# Patient Record
Sex: Female | Born: 1990 | Race: Black or African American | Hispanic: No | Marital: Single | Smoking: Current every day smoker
Health system: Southern US, Community
[De-identification: ages and names within clinical notes are randomized; demographics above are authoritative.]

## PROBLEM LIST (undated history)

## (undated) ENCOUNTER — Inpatient Hospital Stay (HOSPITAL_COMMUNITY): Payer: Self-pay

## (undated) DIAGNOSIS — M549 Dorsalgia, unspecified: Secondary | ICD-10-CM

## (undated) DIAGNOSIS — L309 Dermatitis, unspecified: Secondary | ICD-10-CM

## (undated) DIAGNOSIS — K8020 Calculus of gallbladder without cholecystitis without obstruction: Secondary | ICD-10-CM

## (undated) HISTORY — PX: OTHER SURGICAL HISTORY: SHX169

## (undated) HISTORY — DX: Dorsalgia, unspecified: M54.9

---
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
## 2012-01-03 ENCOUNTER — Inpatient Hospital Stay (HOSPITAL_COMMUNITY): Payer: Medicaid Other

## 2012-01-03 ENCOUNTER — Encounter (HOSPITAL_COMMUNITY): Payer: Self-pay | Admitting: *Deleted

## 2012-01-03 ENCOUNTER — Inpatient Hospital Stay (HOSPITAL_COMMUNITY)
Admission: AD | Admit: 2012-01-03 | Discharge: 2012-01-03 | Disposition: A | Discharge status: Hospice | Payer: Medicaid Other | Source: Ambulatory Visit | Attending: Obstetrics | Admitting: Obstetrics

## 2012-01-03 DIAGNOSIS — O210 Mild hyperemesis gravidarum: Secondary | ICD-10-CM | POA: Insufficient documentation

## 2012-01-03 DIAGNOSIS — R112 Nausea with vomiting, unspecified: Secondary | ICD-10-CM

## 2012-01-03 LAB — CBC
HCT: 36.8 % (ref 36.0–46.0)
MCH: 30.9 pg (ref 26.0–34.0)
MCHC: 35.1 g/dL (ref 30.0–36.0)
MCV: 88.2 fL (ref 78.0–100.0)
Platelets: 277 10*3/uL (ref 150–400)
RDW: 13 % (ref 11.5–15.5)

## 2012-01-03 LAB — COMPREHENSIVE METABOLIC PANEL
ALT: 29 U/L (ref 0–35)
Alkaline Phosphatase: 70 U/L (ref 39–117)
BUN: 4 mg/dL — ABNORMAL LOW (ref 6–23)
CO2: 18 mEq/L — ABNORMAL LOW (ref 19–32)
GFR calc Af Amer: >90 mL/min (ref >90)
GFR calc non Af Amer: >90 mL/min (ref >90)
Glucose, Bld: 74 mg/dL (ref 70–99)
Potassium: 3.4 mEq/L — ABNORMAL LOW (ref 3.5–5.1)
Sodium: 130 mEq/L — ABNORMAL LOW (ref 135–145)

## 2012-01-03 LAB — URINALYSIS, ROUTINE W REFLEX MICROSCOPIC
Glucose, UA: NEGATIVE mg/dL
Hgb urine dipstick: NEGATIVE
Protein, ur: 30 mg/dL — AB
Specific Gravity, Urine: >1.03 — ABNORMAL HIGH (ref 1.005–1.030)

## 2012-01-03 LAB — ABO/RH: ABO/RH(D): O POS

## 2012-01-03 MED ORDER — ONDANSETRON 4 MG PO TBDP
4.0000 mg | ORAL_TABLET | Freq: Once | ORAL | Status: AC
  Administered 2012-01-03: 4 mg via ORAL
  Filled 2012-01-03: qty 1

## 2012-01-03 MED ORDER — THIAMINE HCL 100 MG/ML IJ SOLN
Freq: Once | INTRAVENOUS | Status: AC
  Administered 2012-01-03: 20:00:00 via INTRAVENOUS
  Filled 2012-01-03: qty 1000

## 2012-01-03 MED ORDER — PROMETHAZINE HCL 25 MG/ML IJ SOLN
25.0000 mg | INTRAMUSCULAR | Status: DC
  Administered 2012-01-03: 25 mg via INTRAVENOUS
  Filled 2012-01-03: qty 1

## 2012-01-03 NOTE — Discharge Instructions (Signed)
Nausea and Vomiting   Nausea means you feel sick to your stomach. Throwing up (vomiting) is a reflex where stomach contents come out of your mouth.   HOME CARE   Take medicine as told by your doctor.   Do not force yourself to eat. However, you do need to drink fluids.   If you feel like eating, eat a normal diet as told by your doctor.   Eat Deontay Ladnier, wheat, potatoes, bread, lean meats, yogurt, fruits, and vegetables.   Avoid high-fat foods.   Drink enough fluids to keep your pee (urine) clear or pale yellow.   Ask your doctor how to replace body fluid losses (rehydrate). Signs of body fluid loss (dehydration) include:   Feeling very thirsty.   Dry lips and mouth.   Feeling dizzy.   Dark pee.   Peeing less than normal.   Feeling confused.   Fast breathing or heart rate.   GET HELP RIGHT AWAY IF:   You have blood in your throw up.   You have black or bloody poop (stool).   You have a bad headache or stiff neck.   You feel confused.   You have bad belly (abdominal) pain.   You have chest pain or trouble breathing.   You do not pee at least once every 8 hours.   You have cold, clammy skin.   You keep throwing up after 24 to 48 hours.   You have a fever.   MAKE SURE YOU:   Understand these instructions.   Will watch your condition.   Will get help right away if you are not doing well or get worse.   Document Released: 03/15/2008 Document Revised: 09/16/2011 Document Reviewed: 02/26/2011   Exit[employer] Patient Information 2012 [employer].

## 2012-01-03 NOTE — MAU Provider Note (Signed)
History     CSN: [id number]  Arrival date and time: 01/03/12 1509   First Provider Initiated Contact with Patient 01/03/12 1550      No chief complaint on file.  HPI Sent by Dr. Clearance Coots who saw her today for a new OB visit. She reports that she's had nausea and vomiting throughout her pregnancy and has been seen here once for hyperemesis. Not on any medications at home. Feels fatigued and listless, denies orthostatic symptoms.  OB History    Grav Para Term Preterm Abortions TAB SAB Ect Mult Living   2 1 1  0 0 0 0 0 0 1      Past Medical History  Diagnosis Date  . No pertinent past medical history   . Gall stones     Past Surgical History  Procedure Date  . No past surgeries     History reviewed. No pertinent family history.  History  Substance Use Topics  . Smoking status: Former Smoker -- 0.1 packs/day for 2 years    Types: Cigarettes    Quit date: 12/13/2011  . Smokeless tobacco: Not on file  . Alcohol Use: No    Allergies: No Known Allergies  No prescriptions prior to admission    ROS Physical Exam   Blood pressure 102/68, pulse 98, temperature 98.2 F (36.8 C), temperature source Oral, resp. rate 16, last menstrual period 09/11/2011.  Physical Exam  MAU Course  Procedures  IV hydration with Zofran and Phenergan; pt still felt "bad"- banana bag infused and pt felt better and was ready to be discharged Dr. Clearance Coots called and requested detail OB ultrasound since pt late to prenatal care- while she was in MAU. Dr. Clearance Coots e-scribed Zofran ODT and Citranatal B-Calm to pt's pharmacy- pt to stop taking her regular prenatal.  Assessment and Plan  Care turned over to Pamelia Hoit at [phone]. Discharged by Henrietta Hoover, PA POE,DEIRDRE 01/03/2012, 3:53 PM   I assumed care of this patient from Pamelia Hoit, FNP.  Results for orders placed during the hospital encounter of 01/03/12 (from the past 24 hour(s))  URINALYSIS, ROUTINE W REFLEX MICROSCOPIC      Status: Abnormal   Collection Time   01/03/12  3:15 PM      Component Value Range   Color, Urine YELLOW  YELLOW    APPearance HAZY (*) CLEAR    Specific Gravity, Urine >1.030 (*) 1.005 - 1.030    pH 6.0  5.0 - 8.0    Glucose, UA NEGATIVE  NEGATIVE (mg/dL)   Hgb urine dipstick NEGATIVE  NEGATIVE    Bilirubin Urine SMALL (*) NEGATIVE    Ketones, ur >80 (*) NEGATIVE (mg/dL)   Protein, ur 30 (*) NEGATIVE (mg/dL)   Urobilinogen, UA 0.2  0.0 - 1.0 (mg/dL)   Nitrite NEGATIVE  NEGATIVE    Leukocytes, UA NEGATIVE  NEGATIVE   URINE MICROSCOPIC-ADD ON     Status: Abnormal   Collection Time   01/03/12  3:15 PM      Component Value Range   Squamous Epithelial / LPF FEW (*) RARE    WBC, UA 3-6  <3 (WBC/hpf)   Urine-Other MUCOUS PRESENT    COMPREHENSIVE METABOLIC PANEL     Status: Abnormal   Collection Time   01/03/12  3:55 PM      Component Value Range   Sodium 130 (*) 135 - 145 (mEq/L)   Potassium 3.4 (*) 3.5 - 5.1 (mEq/L)   Chloride 96  96 - 112 (mEq/L)  CO2 18 (*) 19 - 32 (mEq/L)   Glucose, Bld 74  70 - 99 (mg/dL)   BUN 4 (*) 6 - 23 (mg/dL)   Creatinine, Ser 4.09 (*) 0.50 - 1.10 (mg/dL)   Calcium 9.7  8.4 - 81.1 (mg/dL)   Total Protein 7.1  6.0 - 8.3 (g/dL)   Albumin 3.6  3.5 - 5.2 (g/dL)   AST 28  0 - 37 (U/L)   ALT 29  0 - 35 (U/L)   Alkaline Phosphatase 70  39 - 117 (U/L)   Total Bilirubin 0.7  0.3 - 1.2 (mg/dL)   GFR calc non Af Amer >90  >90 (mL/min)   GFR calc Af Amer >90  >90 (mL/min)  ABO/RH     Status: Normal   Collection Time   01/03/12  3:55 PM      Component Value Range   ABO/RH(D) O POS    HIV ANTIBODY (ROUTINE TESTING)     Status: Normal   Collection Time   01/03/12  3:55 PM      Component Value Range   HIV NON REACTIVE  NON REACTIVE   SICKLE CELL SCREEN     Status: Normal   Collection Time   01/03/12  3:55 PM      Component Value Range   Sickle Cell Screen NEGATIVE  NEGATIVE   CBC     Status: Normal   Collection Time   01/03/12  3:55 PM      Component Value  Range   WBC 9.9  4.0 - 10.5 (K/uL)   RBC 4.17  3.87 - 5.11 (MIL/uL)   Hemoglobin 12.9  12.0 - 15.0 (g/dL)   HCT 91.4  78.2 - 95.6 (%)   MCV 88.2  78.0 - 100.0 (fL)   MCH 30.9  26.0 - 34.0 (pg)   MCHC 35.1  30.0 - 36.0 (g/dL)   RDW 21.3  08.6 - 57.8 (%)   Platelets 277  150 - 400 (K/uL)   Pt sx have improved following IV hydration.  A/P: N&V: discussed with pt at length. Dr. Clearance Coots has already called in Rx for antiemetics. Discussed diet, activity, risks, and precautions.   Clinton Gallant. Babita Amaker III, DrHSc, MPAS, PA-C

## 2012-01-03 NOTE — MAU Note (Signed)
Pt sent here from office with c/o dehydration and vomiting several X/daily for weeks.  Not taking any antiemetic.  No diarrhea.

## 2012-01-04 LAB — URINE CULTURE
Colony Count: >=100000
Culture  Setup Time: 201303252211

## 2012-04-03 LAB — OB RESULTS CONSOLE HIV ANTIBODY (ROUTINE TESTING): HIV: NONREACTIVE

## 2012-04-03 LAB — OB RESULTS CONSOLE ABO/RH: RH Type: POSITIVE

## 2012-05-08 ENCOUNTER — Inpatient Hospital Stay (HOSPITAL_COMMUNITY)
Admission: AD | Admit: 2012-05-08 | Discharge: 2012-05-08 | Discharge status: Against Medical Advice | Payer: Medicaid Other | Source: Ambulatory Visit | Attending: Obstetrics | Admitting: Obstetrics

## 2012-05-08 DIAGNOSIS — R109 Unspecified abdominal pain: Secondary | ICD-10-CM | POA: Insufficient documentation

## 2012-05-08 DIAGNOSIS — O99891 Other specified diseases and conditions complicating pregnancy: Secondary | ICD-10-CM | POA: Insufficient documentation

## 2012-05-08 NOTE — MAU Note (Signed)
Called pt from lobby, she was not there. 

## 2012-05-08 NOTE — MAU Note (Signed)
Called from lobby for the third time. No one answered.

## 2012-05-08 NOTE — MAU Note (Signed)
Called pt the second time from lobby. She was not there

## 2012-05-08 NOTE — MAU Note (Signed)
Low back pain, abd pain -like contractions- but not as bad.  "be hurting for a while, like a month and a half."  Asks doctor, but he never says anything.  Was here with seeing sister, tired of it and just wanted to get checked.

## 2012-05-30 ENCOUNTER — Encounter (HOSPITAL_COMMUNITY): Payer: Self-pay | Admitting: Obstetrics and Gynecology

## 2012-05-30 ENCOUNTER — Inpatient Hospital Stay (HOSPITAL_COMMUNITY)
Admission: AD | Admit: 2012-05-30 | Discharge: 2012-05-30 | Disposition: A | Discharge status: Home / Self Care | Payer: Medicaid Other | Source: Ambulatory Visit | Attending: Obstetrics | Admitting: Obstetrics

## 2012-05-30 DIAGNOSIS — R55 Syncope and collapse: Secondary | ICD-10-CM

## 2012-05-30 DIAGNOSIS — O2650 Maternal hypotension syndrome, unspecified trimester: Secondary | ICD-10-CM | POA: Insufficient documentation

## 2012-05-30 DIAGNOSIS — D509 Iron deficiency anemia, unspecified: Secondary | ICD-10-CM

## 2012-05-30 LAB — URINALYSIS, ROUTINE W REFLEX MICROSCOPIC
Glucose, UA: NEGATIVE mg/dL
Ketones, ur: NEGATIVE mg/dL
Leukocytes, UA: NEGATIVE
Protein, ur: 30 mg/dL — AB
Urobilinogen, UA: 0.2 mg/dL (ref 0.0–1.0)

## 2012-05-30 LAB — URINE MICROSCOPIC-ADD ON

## 2012-05-30 LAB — COMPREHENSIVE METABOLIC PANEL
Albumin: 3 g/dL — ABNORMAL LOW (ref 3.5–5.2)
Alkaline Phosphatase: 175 U/L — ABNORMAL HIGH (ref 39–117)
BUN: 5 mg/dL — ABNORMAL LOW (ref 6–23)
Creatinine, Ser: 0.55 mg/dL (ref 0.50–1.10)
Potassium: 3.6 mEq/L (ref 3.5–5.1)
Total Protein: 6.8 g/dL (ref 6.0–8.3)

## 2012-05-30 LAB — OB RESULTS CONSOLE RUBELLA ANTIBODY, IGM: Rubella: IMMUNE

## 2012-05-30 LAB — CBC
HCT: 28.4 % — ABNORMAL LOW (ref 36.0–46.0)
MCHC: 33.1 g/dL (ref 30.0–36.0)
Platelets: 213 10*3/uL (ref 150–400)
RDW: 13.2 % (ref 11.5–15.5)

## 2012-05-30 MED ORDER — FERROUS SULFATE 325 (65 FE) MG PO TABS: 325.0000 mg | ORAL_TABLET | Freq: Two times a day (BID) | ORAL | Status: DC

## 2012-05-30 NOTE — MAU Provider Note (Signed)
Chief Complaint:  Near Syncope   first contact 1620    HPI  Courtney [name] is a 21 y.o. G2P1001 at [redacted]w[redacted]d presenting with dizziness. She was sitting in a chair waiting for her prenatal visit and had just had blood drawn for her RPR. It lasted about 10 min. She's had a couple of prior episodes which were self limited this pregnancy. She's not dizzy at present. Has never fainted. Last ate at breakfast time about 8 hours ago. She describes lightheadedness. Denies sweating, feeling warm nausea, visual changes. No vertigo or disequilibrium. . Denies contractions, leakage of fluid or vaginal bleeding. Good fetal movement.   Pregnancy Course: uncomplicated  Past Medical History: Past Medical History  Diagnosis Date  . No pertinent past medical history   . Gall stones     Past Surgical History: Past Surgical History  Procedure Date  . No past surgeries     Family History: No family history on file.  Social History: History  Substance Use Topics  . Smoking status: Former Smoker -- 0.1 packs/day for 2 years    Types: Cigarettes    Quit date: 12/13/2011  . Smokeless tobacco: Not on file  . Alcohol Use: No    Allergies: No Known Allergies  Meds:  No prescriptions prior to admission      Physical Exam  Last menstrual period 09/11/2011. GENERAL: Well-developed, well-nourished female in no acute distress.  HEENT: normocephalic, good dentition HEART: normal rate; RRR no murmur RESP: normal effort. CTAB ABDOMEN: Soft, nontender, gravid appropriate for gestational age EXTREMITIES: Nontender, no edema NEURO: alert and oriented  FHT:  Baseline 135 , moderate variability, accelerations present, no decelerations Contractions: none   Labs:   Results for orders placed during the hospital encounter of 05/30/12 (from the past 24 hour(s))  CBC     Status: Abnormal   Collection Time   05/30/12  4:01 PM      Component Value Range   WBC 13.8 (*) 4.0 - 10.5 K/uL   RBC 3.02 (*)  3.87 - 5.11 MIL/uL   Hemoglobin 9.4 (*) 12.0 - 15.0 g/dL   HCT 09.8 (*) 11.9 - 14.7 %   MCV 94.0  78.0 - 100.0 fL   MCH 31.1  26.0 - 34.0 pg   MCHC 33.1  30.0 - 36.0 g/dL   RDW 82.9  56.2 - 13.0 %   Platelets 213  150 - 400 K/uL  COMPREHENSIVE METABOLIC PANEL     Status: Abnormal   Collection Time   05/30/12  4:01 PM      Component Value Range   Sodium 134 (*) 135 - 145 mEq/L   Potassium 3.6  3.5 - 5.1 mEq/L   Chloride 101  96 - 112 mEq/L   CO2 23  19 - 32 mEq/L   Glucose, Bld 78  70 - 99 mg/dL   BUN 5 (*) 6 - 23 mg/dL   Creatinine, Ser 8.65  0.50 - 1.10 mg/dL   Calcium 9.5  8.4 - 78.4 mg/dL   Total Protein 6.8  6.0 - 8.3 g/dL   Albumin 3.0 (*) 3.5 - 5.2 g/dL   AST 20  0 - 37 U/L   ALT 9  0 - 35 U/L   Alkaline Phosphatase 175 (*) 39 - 117 U/L   Total Bilirubin 0.4  0.3 - 1.2 mg/dL   GFR calc non Af Amer >90  >90 mL/min   GFR calc Af Amer >90  >90 mL/min  (prior hgb 12.4)  Assessment:  G2P1001 at [redacted]w[redacted]d Dizziness probably related to meal skipping or vasovagal with blood draw. Category 1 FHR  Plan:  C/W Dr. Clearance Coots Will do her GBS since she missed her PNV Fe++ supplement 325mg  po bid and diet discussed   Halsey Hammen 8/20/20134:01 PM

## 2012-06-02 LAB — CULTURE, BETA STREP (GROUP B ONLY)

## 2012-06-29 ENCOUNTER — Encounter (HOSPITAL_COMMUNITY): Payer: Self-pay | Admitting: *Deleted

## 2012-06-29 ENCOUNTER — Telehealth (HOSPITAL_COMMUNITY): Payer: Self-pay | Admitting: *Deleted

## 2012-06-29 NOTE — Telephone Encounter (Signed)
Preadmission screen  

## 2012-06-29 NOTE — Telephone Encounter (Signed)
Preadmission screenPreadmission screen 

## 2012-07-03 ENCOUNTER — Inpatient Hospital Stay (HOSPITAL_COMMUNITY): Payer: Medicaid Other

## 2012-07-03 ENCOUNTER — Other Ambulatory Visit: Payer: Self-pay | Admitting: Obstetrics

## 2012-07-03 ENCOUNTER — Inpatient Hospital Stay (HOSPITAL_COMMUNITY)
Admission: AD | Admit: 2012-07-03 | Discharge: 2012-07-03 | Disposition: A | Discharge status: Home / Self Care | Payer: Medicaid Other | Source: Ambulatory Visit | Attending: Obstetrics & Gynecology | Admitting: Obstetrics & Gynecology

## 2012-07-03 ENCOUNTER — Encounter (HOSPITAL_COMMUNITY): Payer: Self-pay | Admitting: *Deleted

## 2012-07-03 DIAGNOSIS — R109 Unspecified abdominal pain: Secondary | ICD-10-CM | POA: Insufficient documentation

## 2012-07-03 DIAGNOSIS — O368190 Decreased fetal movements, unspecified trimester, not applicable or unspecified: Secondary | ICD-10-CM | POA: Insufficient documentation

## 2012-07-03 NOTE — MAU Note (Signed)
Pt presents for decreased movement that she has noticed since last Wednesday.  Pt does feel fetal movement, but that it's not as much as previously.  She also c/o a sharp lower abdominal pain that is occurring approximately every 15-21minutes.  Denies any LOF or vaginal bleeding.

## 2012-07-04 ENCOUNTER — Inpatient Hospital Stay (HOSPITAL_COMMUNITY)
Admission: RE | Admit: 2012-07-04 | Discharge: 2012-07-07 | DRG: 775 | Disposition: A | Discharge status: Home / Self Care | Payer: Medicaid Other | Source: Ambulatory Visit | Attending: Obstetrics | Admitting: Obstetrics

## 2012-07-04 DIAGNOSIS — O480 Post-term pregnancy: Principal | ICD-10-CM | POA: Diagnosis present

## 2012-07-04 DIAGNOSIS — D509 Iron deficiency anemia, unspecified: Secondary | ICD-10-CM

## 2012-07-04 LAB — CBC
HCT: 28.3 % — ABNORMAL LOW (ref 36.0–46.0)
MCV: 91.6 fL (ref 78.0–100.0)
Platelets: 259 10*3/uL (ref 150–400)
RBC: 3.09 MIL/uL — ABNORMAL LOW (ref 3.87–5.11)
WBC: 12.7 10*3/uL — ABNORMAL HIGH (ref 4.0–10.5)

## 2012-07-04 LAB — OB RESULTS CONSOLE HIV ANTIBODY (ROUTINE TESTING): HIV: NONREACTIVE

## 2012-07-04 MED ORDER — HYDROXYZINE HCL 50 MG/ML IM SOLN: 50.0000 mg | Freq: Four times a day (QID) | INTRAMUSCULAR | Status: DC | PRN

## 2012-07-04 MED ORDER — ONDANSETRON HCL 4 MG/2ML IJ SOLN
4.0000 mg | Freq: Four times a day (QID) | INTRAMUSCULAR | Status: DC | PRN
  Administered 2012-07-05: 4 mg via INTRAVENOUS
  Filled 2012-07-04: qty 2

## 2012-07-04 MED ORDER — ACETAMINOPHEN 325 MG PO TABS: 650.0000 mg | ORAL_TABLET | ORAL | Status: DC | PRN

## 2012-07-04 MED ORDER — HYDROXYZINE HCL 50 MG PO TABS: 50.0000 mg | ORAL_TABLET | Freq: Four times a day (QID) | ORAL | Status: DC | PRN

## 2012-07-04 MED ORDER — CITRIC ACID-SODIUM CITRATE 334-500 MG/5ML PO SOLN: 30.0000 mL | ORAL | Status: DC | PRN

## 2012-07-04 MED ORDER — MISOPROSTOL 25 MCG QUARTER TABLET
25.0000 ug | ORAL_TABLET | ORAL | Status: DC | PRN
  Administered 2012-07-05: 25 ug via VAGINAL
  Filled 2012-07-04: qty 0.25

## 2012-07-04 MED ORDER — LIDOCAINE HCL (PF) 1 % IJ SOLN
30.0000 mL | INTRAMUSCULAR | Status: DC | PRN
  Filled 2012-07-04: qty 30

## 2012-07-04 MED ORDER — OXYTOCIN BOLUS FROM INFUSION
500.0000 mL | Freq: Once | INTRAVENOUS | Status: DC
  Filled 2012-07-04: qty 500

## 2012-07-04 MED ORDER — BUTORPHANOL TARTRATE 1 MG/ML IJ SOLN
1.0000 mg | INTRAMUSCULAR | Status: DC | PRN
  Administered 2012-07-05: 1 mg via INTRAVENOUS
  Filled 2012-07-04: qty 1

## 2012-07-04 MED ORDER — TERBUTALINE SULFATE 1 MG/ML IJ SOLN: 0.2500 mg | Freq: Once | INTRAMUSCULAR | Status: AC | PRN

## 2012-07-04 MED ORDER — LACTATED RINGERS IV SOLN: 500.0000 mL | INTRAVENOUS | Status: DC | PRN

## 2012-07-04 MED ORDER — OXYTOCIN 40 UNITS IN LACTATED RINGERS INFUSION - SIMPLE MED
62.5000 mL/h | Freq: Once | INTRAVENOUS | Status: DC
  Filled 2012-07-04: qty 1000

## 2012-07-04 MED ORDER — IBUPROFEN 600 MG PO TABS: 600.0000 mg | ORAL_TABLET | Freq: Four times a day (QID) | ORAL | Status: DC | PRN

## 2012-07-04 MED ORDER — OXYCODONE-ACETAMINOPHEN 5-325 MG PO TABS: 1.0000 | ORAL_TABLET | ORAL | Status: DC | PRN

## 2012-07-04 MED ORDER — LACTATED RINGERS IV SOLN
INTRAVENOUS | Status: DC
  Administered 2012-07-05: 03:00:00 via INTRAVENOUS

## 2012-07-04 NOTE — Progress Notes (Signed)
Notified Dr. Clearance Coots of decel. MD orders to hold cytotec until midnight, allow pt to eat and monitor FHR.

## 2012-07-05 ENCOUNTER — Encounter (HOSPITAL_COMMUNITY): Payer: Self-pay | Admitting: Anesthesiology

## 2012-07-05 ENCOUNTER — Inpatient Hospital Stay (HOSPITAL_COMMUNITY): Payer: Medicaid Other | Admitting: Anesthesiology

## 2012-07-05 ENCOUNTER — Encounter (HOSPITAL_COMMUNITY): Payer: Self-pay

## 2012-07-05 MED ORDER — FENTANYL 2.5 MCG/ML BUPIVACAINE 1/10 % EPIDURAL INFUSION (WH - ANES)
14.0000 mL/h | INTRAMUSCULAR | Status: DC
  Filled 2012-07-05: qty 60

## 2012-07-05 MED ORDER — DIPHENHYDRAMINE HCL 25 MG PO CAPS: 25.0000 mg | ORAL_CAPSULE | Freq: Four times a day (QID) | ORAL | Status: DC | PRN

## 2012-07-05 MED ORDER — SIMETHICONE 80 MG PO CHEW: 80.0000 mg | CHEWABLE_TABLET | ORAL | Status: DC | PRN

## 2012-07-05 MED ORDER — SENNOSIDES-DOCUSATE SODIUM 8.6-50 MG PO TABS
2.0000 | ORAL_TABLET | Freq: Every day | ORAL | Status: DC
  Administered 2012-07-05 – 2012-07-06 (×2): 2 via ORAL

## 2012-07-05 MED ORDER — DIBUCAINE 1 % RE OINT: 1.0000 "application " | TOPICAL_OINTMENT | RECTAL | Status: DC | PRN

## 2012-07-05 MED ORDER — ONDANSETRON HCL 4 MG/2ML IJ SOLN: 4.0000 mg | INTRAMUSCULAR | Status: DC | PRN

## 2012-07-05 MED ORDER — OXYTOCIN 40 UNITS IN LACTATED RINGERS INFUSION - SIMPLE MED: 62.5000 mL/h | INTRAVENOUS | Status: DC | PRN

## 2012-07-05 MED ORDER — PHENYLEPHRINE 40 MCG/ML (10ML) SYRINGE FOR IV PUSH (FOR BLOOD PRESSURE SUPPORT)
80.0000 ug | PREFILLED_SYRINGE | INTRAVENOUS | Status: DC | PRN
  Filled 2012-07-05: qty 5

## 2012-07-05 MED ORDER — PHENYLEPHRINE 40 MCG/ML (10ML) SYRINGE FOR IV PUSH (FOR BLOOD PRESSURE SUPPORT): 80.0000 ug | PREFILLED_SYRINGE | INTRAVENOUS | Status: DC | PRN

## 2012-07-05 MED ORDER — ONDANSETRON HCL 4 MG PO TABS: 4.0000 mg | ORAL_TABLET | ORAL | Status: DC | PRN

## 2012-07-05 MED ORDER — LIDOCAINE HCL (PF) 1 % IJ SOLN
INTRAMUSCULAR | Status: DC | PRN
  Administered 2012-07-05 (×2): 9 mL

## 2012-07-05 MED ORDER — EPHEDRINE 5 MG/ML INJ
10.0000 mg | INTRAVENOUS | Status: DC | PRN
  Filled 2012-07-05: qty 4

## 2012-07-05 MED ORDER — EPHEDRINE 5 MG/ML INJ: 10.0000 mg | INTRAVENOUS | Status: DC | PRN

## 2012-07-05 MED ORDER — OXYCODONE-ACETAMINOPHEN 5-325 MG PO TABS
1.0000 | ORAL_TABLET | ORAL | Status: DC | PRN
  Administered 2012-07-05 – 2012-07-07 (×4): 1 via ORAL
  Filled 2012-07-05 (×4): qty 1

## 2012-07-05 MED ORDER — FENTANYL 2.5 MCG/ML BUPIVACAINE 1/10 % EPIDURAL INFUSION (WH - ANES)
INTRAMUSCULAR | Status: DC | PRN
  Administered 2012-07-05: 14 mL/h via EPIDURAL

## 2012-07-05 MED ORDER — TETANUS-DIPHTH-ACELL PERTUSSIS 5-2.5-18.5 LF-MCG/0.5 IM SUSP: 0.5000 mL | Freq: Once | INTRAMUSCULAR | Status: DC

## 2012-07-05 MED ORDER — BENZOCAINE-MENTHOL 20-0.5 % EX AERO
1.0000 "application " | INHALATION_SPRAY | CUTANEOUS | Status: DC | PRN
  Administered 2012-07-05: 1 via TOPICAL
  Filled 2012-07-05: qty 56

## 2012-07-05 MED ORDER — ZOLPIDEM TARTRATE 5 MG PO TABS: 5.0000 mg | ORAL_TABLET | Freq: Every evening | ORAL | Status: DC | PRN

## 2012-07-05 MED ORDER — WITCH HAZEL-GLYCERIN EX PADS: 1.0000 "application " | MEDICATED_PAD | CUTANEOUS | Status: DC | PRN

## 2012-07-05 MED ORDER — DIPHENHYDRAMINE HCL 50 MG/ML IJ SOLN: 12.5000 mg | INTRAMUSCULAR | Status: DC | PRN

## 2012-07-05 MED ORDER — METHYLERGONOVINE MALEATE 0.2 MG/ML IJ SOLN
INTRAMUSCULAR | Status: AC
  Administered 2012-07-05: 0.2 mg
  Filled 2012-07-05: qty 1

## 2012-07-05 MED ORDER — LACTATED RINGERS IV SOLN
500.0000 mL | Freq: Once | INTRAVENOUS | Status: DC
Start: 2012-07-05 — End: 2012-07-05

## 2012-07-05 MED ORDER — LANOLIN HYDROUS EX OINT: TOPICAL_OINTMENT | CUTANEOUS | Status: DC | PRN

## 2012-07-05 MED ORDER — MEDROXYPROGESTERONE ACETATE 150 MG/ML IM SUSP: 150.0000 mg | INTRAMUSCULAR | Status: DC | PRN

## 2012-07-05 MED ORDER — PRENATAL MULTIVITAMIN CH
1.0000 | ORAL_TABLET | Freq: Every day | ORAL | Status: DC
  Administered 2012-07-06 – 2012-07-07 (×2): 1 via ORAL
  Filled 2012-07-05 (×2): qty 1

## 2012-07-05 MED ORDER — IBUPROFEN 600 MG PO TABS
600.0000 mg | ORAL_TABLET | Freq: Four times a day (QID) | ORAL | Status: DC
  Administered 2012-07-05 – 2012-07-07 (×9): 600 mg via ORAL
  Filled 2012-07-05 (×9): qty 1

## 2012-07-05 NOTE — Progress Notes (Signed)
Courtney [name] is [name] 21 y.o. G2P1001 at [redacted]w[redacted]d by LMP admitted for induction of labor due to Post dates. Due date 06-28-12.  Subjective:   Objective: BP 114/69  Pulse 81  Temp 98.1 F (36.7 C) (Oral)  Resp 18  Ht 5\' 5"  (1.651 m)  Wt 74.844 kg (165 lb)  BMI 27.46 kg/m2  LMP 09/11/2011      FHT:  FHR: 150 bpm, variability: moderate,  accelerations:  Present,  decelerations:  Absent UC:   regular, every 3 minutes SVE:   Dilation: Lip/rim Effacement (%): 100 Station: 0 Exam by:: Lcarpenter,RN  Labs: Lab Results  Component Value Date   WBC 12.7* 07/04/2012   HGB 9.4* 07/04/2012   HCT 28.3* 07/04/2012   MCV 91.6 07/04/2012   PLT 259 07/04/2012    Assessment / Plan: Induction of labor due to postterm,  progressing well on pitocin  Labor: Progressing on Pitocin, will continue to increase then AROM Preeclampsia:  n/[name] Fetal Wellbeing:  Category I Pain Control:  Epidural I/D:  n/[name] Anticipated MOD:  NSVD  Courtney [name] 07/05/2012, 5:06 AM

## 2012-07-05 NOTE — Anesthesia Postprocedure Evaluation (Signed)
  Anesthesia Post-op Note  Patient: Courtney [name]  Procedure(s) Performed: * No procedures listed *  Patient Location: Mother/Baby  Anesthesia Type: Epidural  Level of Consciousness: awake  Airway and Oxygen Therapy: Patient Spontanous Breathing  Post-op Pain: mild  Post-op Assessment: Patient's Cardiovascular Status Stable and Respiratory Function Stable  Post-op Vital Signs: stable  Complications: No apparent anesthesia complications

## 2012-07-05 NOTE — Anesthesia Procedure Notes (Signed)
Epidural Patient location during procedure: OB Start time: 07/05/2012 3:26 AM End time: 07/05/2012 3:30 AM  Staffing Anesthesiologist: Sandrea Hughs Performed by: anesthesiologist   Preanesthetic Checklist Completed: patient identified, site marked, surgical consent, pre-op evaluation, timeout performed, IV checked, risks and benefits discussed and monitors and equipment checked  Epidural Patient position: sitting Prep: site prepped and draped and DuraPrep Patient monitoring: continuous pulse ox and blood pressure Approach: midline Injection technique: LOR air  Needle:  Needle type: Tuohy  Needle gauge: 17 G Needle length: 9 cm and 9 Needle insertion depth: 5 cm cm Catheter type: closed end flexible Catheter size: 19 Gauge Catheter at skin depth: 10 cm Test dose: negative and Other  Assessment Sensory level: T8 Events: blood not aspirated, injection not painful, no injection resistance, negative IV test and no paresthesia  Additional Notes Reason for block:procedure for pain

## 2012-07-05 NOTE — H&P (Signed)
Courtney [name] is a 21 y.o. female presenting for IOL for postsates. Maternal Medical History:  Reason for admission: 21 yo G2 P1.  EDC 06-28-12.  Presents for IOL for postdates.  GBS negative.  Fetal activity: Perceived fetal activity is normal.   Last perceived fetal movement was within the past hour.    Prenatal complications: no prenatal complications Prenatal Complications - Diabetes: none.    OB History    Grav Para Term Preterm Abortions TAB SAB Ect Mult Living   2 1 1  0 0 0 0 0 0 1     Past Medical History  Diagnosis Date  . No pertinent past medical history   . Gall stones    Past Surgical History  Procedure Date  . No past surgeries    Family History: family history is not on file. Social History:  reports that she quit smoking about 6 months ago. Her smoking use included Cigarettes. She has a .2 pack-year smoking history. She does not have any smokeless tobacco history on file. She reports that she does not drink alcohol or use illicit drugs.   Prenatal Transfer Tool  Maternal Diabetes: No Genetic Screening: Normal Maternal Ultrasounds/Referrals: Normal Fetal Ultrasounds or other Referrals:  None Maternal Substance Abuse:  No Significant Maternal Medications:  Meds include: Other:  PNV Significant Maternal Lab Results:  Lab values include: Group B Strep negative Other Comments:  None  Review of Systems  All other systems reviewed and are negative.    Dilation: 4.5 Effacement (%): 80 Station: -2 Exam by:: [employer] Blood pressure 104/62, pulse 80, temperature 98.1 F (36.7 C), temperature source Oral, resp. rate 16, height 5\' 5"  (1.651 m), weight 74.844 kg (165 lb), last menstrual period 09/11/2011. Maternal Exam:  Abdomen: Patient reports no abdominal tenderness. Introitus: Normal vulva. Normal vagina.  Pelvis: adequate for delivery.   Cervix: Cervix evaluated by digital exam.     Physical Exam  Nursing note and vitals reviewed. Constitutional: She  is oriented to person, place, and time. She appears well-developed and well-nourished.  HENT:  Head: Normocephalic and atraumatic.  Eyes: Conjunctivae normal are normal. Pupils are equal, round, and reactive to light.  Neck: Normal range of motion. Neck supple.  Cardiovascular: Normal rate and regular rhythm.   Respiratory: Effort normal.  GI: Soft.  Genitourinary: Vagina normal and uterus normal.  Musculoskeletal: Normal range of motion.  Neurological: She is alert and oriented to person, place, and time.  Skin: Skin is warm and dry.  Psychiatric: She has a normal mood and affect. Her behavior is normal. Judgment and thought content normal.    Prenatal labs: ABO, Rh: O/Positive/-- (06/24 0000) Antibody:   Rubella: Immune (08/20 0000) RPR: Nonreactive, Nonreactive (08/20 0000)  HBsAg: Negative (06/24 0000)  HIV: Non-reactive (09/24 1955)  GBS: Negative (09/24 1955)   Assessment/Plan: 41 weeks.  2 stage IOL.   HARPER,CHARLES A 07/05/2012, 4:18 AM

## 2012-07-05 NOTE — Anesthesia Preprocedure Evaluation (Signed)
Anesthesia Evaluation  Patient identified by MRN, date of birth, ID band Patient awake    Reviewed: Allergy & Precautions, H&P , Patient's Chart, lab work & pertinent test results  Airway Mallampati: I TM Distance: >3 FB Neck ROM: full    Dental No notable dental hx.    Pulmonary neg pulmonary ROS,    Pulmonary exam normal       Cardiovascular negative cardio ROS      Neuro/Psych negative neurological ROS  negative psych ROS   GI/Hepatic negative GI ROS, Neg liver ROS,   Endo/Other  negative endocrine ROS  Renal/GU negative Renal ROS  negative genitourinary   Musculoskeletal negative musculoskeletal ROS (+)   Abdominal Normal abdominal exam  (+)   Peds negative pediatric ROS (+)  Hematology negative hematology ROS (+)   Anesthesia Other Findings   Reproductive/Obstetrics (+) Pregnancy                           Anesthesia Physical Anesthesia Plan  ASA: II  Anesthesia Plan: Epidural   Post-op Pain Management:    Induction:   Airway Management Planned:   Additional Equipment:   Intra-op Plan:   Post-operative Plan:   Informed Consent: I have reviewed the patients History and Physical, chart, labs and discussed the procedure including the risks, benefits and alternatives for the proposed anesthesia with the patient or authorized representative who has indicated his/her understanding and acceptance.     Plan Discussed with:   Anesthesia Plan Comments:         Anesthesia Quick Evaluation  

## 2012-07-05 NOTE — Progress Notes (Signed)
Courtney [name] is a 21 y.o. G2P1001 at [redacted]w[redacted]d by LMP admitted for induction of labor due to Post dates. Due date 06-28-12.  Subjective:   Objective: BP 104/62  Pulse 80  Temp 98.1 F (36.7 C) (Oral)  Resp 16  Ht 5\' 5"  (1.651 m)  Wt 74.844 kg (165 lb)  BMI 27.46 kg/m2  LMP 09/11/2011      FHT:  FHR: 150 bpm, variability: moderate,  accelerations:  Present,  decelerations:  Absent UC:   regular, every 3 minutes SVE:   Dilation: 4.5 Effacement (%): 80 Station: -2 Exam by:: [employer] RN  Labs: Lab Results  Component Value Date   WBC 12.7* 07/04/2012   HGB 9.4* 07/04/2012   HCT 28.3* 07/04/2012   MCV 91.6 07/04/2012   PLT 259 07/04/2012    Assessment / Plan: Induction of labor due to postterm,  progressing well on pitocin  Labor: Progressing normally Preeclampsia:  n/a Fetal Wellbeing:  Category I Pain Control:  Epidural I/D:  n/a Anticipated MOD:  NSVD  Boomer Winders A 07/05/2012, 4:25 AM

## 2012-07-05 NOTE — Progress Notes (Signed)
Called Dr. Clearance Coots for epidural orders. MD notified of SVE change.

## 2012-07-06 LAB — CBC
HCT: 24.4 % — ABNORMAL LOW (ref 36.0–46.0)
Hemoglobin: 8 g/dL — ABNORMAL LOW (ref 12.0–15.0)
MCH: 30.2 pg (ref 26.0–34.0)
MCHC: 32.8 g/dL (ref 30.0–36.0)
MCV: 92.1 fL (ref 78.0–100.0)
RBC: 2.65 MIL/uL — ABNORMAL LOW (ref 3.87–5.11)

## 2012-07-06 NOTE — Progress Notes (Signed)
Post Partum Day 1 Subjective: no complaints  Objective: Blood pressure 110/72, pulse 60, temperature 97.5 F (36.4 C), temperature source Oral, resp. rate 18, height 5\' 5"  (1.651 m), weight 74.844 kg (165 lb), last menstrual period 09/11/2011, SpO2 100.00%, unknown if currently breastfeeding.  Physical Exam:  General: alert and no distress Lochia: appropriate Uterine Fundus: firm Incision: none DVT Evaluation: No evidence of DVT seen on physical exam.   Basename 07/06/12 0555 07/04/12 2020  HGB 8.0* 9.4*  HCT 24.4* 28.3*    Assessment/Plan: Plan for discharge tomorrow  Anemia.  Chronic.  Clinically stable.  Start iron.   LOS: 2 days   HARPER,CHARLES A 07/06/2012, 8:32 AM

## 2012-07-06 NOTE — Progress Notes (Signed)
UR Chart review completed.  

## 2012-07-07 MED ORDER — OXYCODONE-ACETAMINOPHEN 5-325 MG PO TABS: 1.0000 | ORAL_TABLET | ORAL | Status: DC | PRN

## 2012-07-07 MED ORDER — FUSION PLUS PO CAPS: 1.0000 | ORAL_CAPSULE | Freq: Every day | ORAL | Status: DC

## 2012-07-07 MED ORDER — IBUPROFEN 600 MG PO TABS: 600.0000 mg | ORAL_TABLET | Freq: Four times a day (QID) | ORAL | Status: DC

## 2012-07-07 NOTE — Progress Notes (Signed)
Post Partum Day 2 Subjective: no complaints  Objective: Blood pressure 112/68, pulse 88, temperature 98.1 F (36.7 C), temperature source Oral, resp. rate 18, height 5\' 5"  (1.651 m), weight 74.844 kg (165 lb), last menstrual period 09/11/2011, SpO2 100.00%, unknown if currently breastfeeding.  Physical Exam:  General: alert and no distress Lochia: appropriate Uterine Fundus: firm Incision: healing well DVT Evaluation: No evidence of DVT seen on physical exam.   Basename 07/06/12 0555 07/04/12 2020  HGB 8.0* 9.4*  HCT 24.4* 28.3*    Assessment/Plan: Discharge home.  Anemia.  Chronic iron deficiency.  Clinically stable.  Iron Rx.   LOS: 3 days   Becky Berberian A 07/07/2012, 11:04 AM

## 2012-07-07 NOTE — Discharge Summary (Signed)
Obstetric Discharge Summary Reason for Admission: onset of labor Prenatal Procedures: ultrasound Intrapartum Procedures: spontaneous vaginal delivery Postpartum Procedures: none Complications-Operative and Postpartum: none Hemoglobin  Date Value Range Status  07/06/2012 8.0* 12.0 - 15.0 g/dL Final     HCT  Date Value Range Status  07/06/2012 24.4* 36.0 - 46.0 % Final    Physical Exam:  General: alert and no distress Lochia: appropriate Uterine Fundus: firm Incision: healing well DVT Evaluation: No evidence of DVT seen on physical exam.  Discharge Diagnoses: Term Pregnancy-delivered  Discharge Information: Date: 07/07/2012 Activity: pelvic rest Diet: routine Medications: PNV, Iron, Ibuprofen, Percocet Condition: stable Instructions: refer to practice specific booklet Discharge to: home Follow-up Information    Follow up with HARPER,CHARLES A, MD. Schedule an appointment as soon as possible for a visit in 6 weeks.   Contact information:   [address] [place] [address] [phone]          Newborn Data: Live born female  Birth Weight: 8 lb 2.5 oz (3700 g) APGAR: 7, 9  Home with mother.  HARPER,CHARLES A 07/07/2012, 11:14 AM

## 2012-10-11 NOTE — L&D Delivery Note (Signed)
Delivery Note At 12:13 PM a viable female was delivered via  (Presentation: Vertex ; LOA  ).  APGAR: 6-8, ; weight: 3209 grams .   Placenta status: Spontaneous, intact, .  Cord: 3 vessel  with the following complications:  None .  Cord pH: none  Anesthesia:  Epidural Episiotomy: None Lacerations: None Suture Repair: none Est. Blood Loss (mL): 350  Mom to postpartum.  Baby to Couplet care / Skin to Skin.  HARPER,CHARLES A 09/27/2013, 12:37 PM

## 2012-12-29 ENCOUNTER — Encounter: Payer: Self-pay | Admitting: *Deleted

## 2013-01-02 ENCOUNTER — Encounter: Payer: Self-pay | Admitting: Obstetrics

## 2013-01-02 ENCOUNTER — Ambulatory Visit (INDEPENDENT_AMBULATORY_CARE_PROVIDER_SITE_OTHER): Payer: Medicaid Other | Admitting: Obstetrics

## 2013-01-02 VITALS — BP 97/66 | HR 93 | Temp 97.9°F | Ht 65.0 in | Wt 150.0 lb

## 2013-01-02 DIAGNOSIS — Z3009 Encounter for other general counseling and advice on contraception: Secondary | ICD-10-CM | POA: Insufficient documentation

## 2013-01-02 NOTE — Patient Instructions (Addendum)
Routine instructions for prevention of pregnancy before Nexplanon insertion.

## 2013-01-02 NOTE — Progress Notes (Signed)
Patient desires contraception with Nexplanon.  All questions answered.  Educational material given to patient.  Patient will call for appointment with next period.

## 2013-01-02 NOTE — Progress Notes (Signed)
Pt is in office today for a birth control consult. Pt is not currently on any form of birth control. Pt states she last had intercourse on 01-02-13 without any protection. Pt states she she is interested in starting the Depo.

## 2013-01-23 ENCOUNTER — Emergency Department (INDEPENDENT_AMBULATORY_CARE_PROVIDER_SITE_OTHER)
Admission: EM | Admit: 2013-01-23 | Discharge: 2013-01-23 | Disposition: A | Discharge status: Home / Self Care | Payer: Medicaid Other | Source: Home / Self Care | Attending: Emergency Medicine | Admitting: Emergency Medicine

## 2013-01-23 ENCOUNTER — Encounter (HOSPITAL_COMMUNITY): Payer: Self-pay

## 2013-01-23 DIAGNOSIS — R110 Nausea: Secondary | ICD-10-CM

## 2013-01-23 DIAGNOSIS — R05 Cough: Secondary | ICD-10-CM

## 2013-01-23 LAB — POCT URINALYSIS DIP (DEVICE)
Glucose, UA: NEGATIVE mg/dL
Hgb urine dipstick: NEGATIVE
Nitrite: NEGATIVE
Protein, ur: NEGATIVE mg/dL
Specific Gravity, Urine: 1.025 (ref 1.005–1.030)
Urobilinogen, UA: 0.2 mg/dL (ref 0.0–1.0)

## 2013-01-23 LAB — POCT PREGNANCY, URINE: Preg Test, Ur: NEGATIVE

## 2013-01-23 MED ORDER — GUAIFENESIN-CODEINE 100-10 MG/5ML PO SYRP: 5.0000 mL | ORAL_SOLUTION | Freq: Three times a day (TID) | ORAL | Status: DC | PRN

## 2013-01-23 MED ORDER — OMEPRAZOLE 20 MG PO CPDR: 20.0000 mg | DELAYED_RELEASE_CAPSULE | Freq: Every day | ORAL | Status: DC

## 2013-01-23 MED ORDER — CETIRIZINE HCL 10 MG PO CAPS: 1.0000 | ORAL_CAPSULE | Freq: Every day | ORAL | Status: DC

## 2013-01-23 NOTE — ED Provider Notes (Signed)
History     CSN: [id number]  Arrival date & time 01/23/13  1152   First MD Initiated Contact with Patient 01/23/13 1202      Chief Complaint  Patient presents with  . URI    (Consider location/radiation/quality/duration/timing/severity/associated sxs/prior treatment) HPI Comments: You feel man patient presents to urgent care complaining of cough congestion and lower abdominal pain and nausea for 2 days. She's wondering if she's pregnant. Patient does not have a hematocrit of this point. Is sexually active denies any urinary symptoms or vaginal discharge fevers or pelvic pain.  Patient is a 22 y.o. female presenting with URI. The history is provided by the patient.  URI Presenting symptoms: congestion, cough and rhinorrhea   Presenting symptoms: no ear pain, no facial pain, no fatigue, no fever and no sore throat   Severity:  Moderate Onset quality:  Sudden Timing:  Constant Progression:  Waxing and waning Relieved by:  Nothing Worsened by:  Nothing tried Associated symptoms: sinus pain   Associated symptoms: no arthralgias, no headaches, no neck pain, no swollen glands and no wheezing     Past Medical History  Diagnosis Date  . No pertinent past medical history   . Gall stones     Past Surgical History  Procedure Laterality Date  . No past surgeries    . Gallstones removed      History reviewed. No pertinent family history.  History  Substance Use Topics  . Smoking status: Current Every Day Smoker -- 0.40 packs/day for 2 years    Types: Cigarettes    Last Attempt to Quit: 12/13/2011  . Smokeless tobacco: Not on file  . Alcohol Use: No    OB History   Grav Para Term Preterm Abortions TAB SAB Ect Mult Living   2 2 2  0 0 0 0 0 0 2      Review of Systems  Constitutional: Negative for fever, activity change, appetite change and fatigue.  HENT: Positive for congestion and rhinorrhea. Negative for ear pain, nosebleeds, sore throat, neck pain and neck stiffness.    Eyes: Negative for photophobia.  Respiratory: Positive for cough. Negative for chest tightness, wheezing and stridor.   Gastrointestinal: Positive for nausea and abdominal pain. Negative for vomiting, diarrhea, abdominal distention and rectal pain.  Genitourinary: Negative for dysuria, frequency, hematuria, flank pain, vaginal discharge, vaginal pain and pelvic pain.  Musculoskeletal: Negative for arthralgias.  Skin: Negative for rash.  Neurological: Negative for headaches.    Allergies  Review of patient's allergies indicates no known allergies.  Home Medications   Current Outpatient Rx  Name  Route  Sig  Dispense  Refill  . Cetirizine HCl (ZYRTEC ALLERGY) 10 MG CAPS   Oral   Take 1 capsule (10 mg total) by mouth daily. X 2 weeks   10 capsule   1   . guaiFENesin-codeine (ROBITUSSIN AC) 100-10 MG/5ML syrup   Oral   Take 5 mLs by mouth 3 (three) times daily as needed for cough.   120 mL   0   . Iron-FA-B Cmp-C-Biot-Probiotic (FUSION PLUS) CAPS   Oral   Take 1 capsule by mouth daily before breakfast.   30 capsule   5   . omeprazole (PRILOSEC) 20 MG capsule   Oral   Take 1 capsule (20 mg total) by mouth daily.   14 capsule   0     BP 112/73  Pulse 85  Temp(Src) 98.3 F (36.8 C) (Oral)  Resp 16  SpO2 100%  LMP 12/23/2012  Physical Exam  Nursing note and vitals reviewed. Constitutional: Vital signs are normal. She appears well-developed and well-nourished.  Non-toxic appearance. She does not have a sickly appearance. She does not appear ill. No distress.  HENT:  Head: Normocephalic.  Right Ear: Tympanic membrane normal.  Left Ear: Tympanic membrane normal.  Nose: Rhinorrhea present. No mucosal edema.  Mouth/Throat: Uvula is midline and mucous membranes are normal. Posterior oropharyngeal erythema present. No oropharyngeal exudate, posterior oropharyngeal edema or tonsillar abscesses.  Eyes: Conjunctivae are normal. No scleral icterus.  Cardiovascular: Normal  rate.   Pulmonary/Chest: Effort normal and breath sounds normal. She has no decreased breath sounds. She has no wheezes. She has no rhonchi. She has no rales.  Abdominal: Soft.  Lymphadenopathy:    She has no cervical adenopathy.  Skin: No rash noted. No erythema.    ED Course  Procedures (including critical care time)  Labs Reviewed  URINALYSIS, DIPSTICK ONLY  POCT PREGNANCY, URINE  POCT URINALYSIS DIP (DEVICE)   No results found.   1. Cough   2. Nausea       MDM  Mild respiratory symptoms most likely related to environmental allergies. Soft abdomen no pelvic pain. Normal urine. Patient has been encouraged to return if any further symptoms or worsening or no changes. Have been prescribed omeprazole and Zyrtec.        Jimmie Molly, MD 01/23/13 1434

## 2013-01-23 NOTE — ED Notes (Signed)
Concerned about cough, congestion; lower abdominal area pain, nausea; sexually active w/o BC; denies STD concerns

## 2013-01-23 NOTE — ED Notes (Signed)
Pt had already self dc thinking her visit was complete. Was able to discuss her care and Rx use at her car in parking lot, acknowledged same; no signature from pt

## 2013-02-10 ENCOUNTER — Emergency Department (INDEPENDENT_AMBULATORY_CARE_PROVIDER_SITE_OTHER)
Admission: EM | Admit: 2013-02-10 | Discharge: 2013-02-10 | Disposition: A | Discharge status: Home / Self Care | Payer: Medicaid Other | Source: Home / Self Care

## 2013-02-10 ENCOUNTER — Inpatient Hospital Stay (HOSPITAL_COMMUNITY)
Admission: AD | Admit: 2013-02-10 | Discharge: 2013-02-10 | Disposition: A | Discharge status: Home / Self Care | Payer: Medicaid Other | Source: Ambulatory Visit | Attending: Obstetrics & Gynecology | Admitting: Obstetrics & Gynecology

## 2013-02-10 ENCOUNTER — Encounter (HOSPITAL_COMMUNITY): Payer: Self-pay | Admitting: Emergency Medicine

## 2013-02-10 ENCOUNTER — Encounter (HOSPITAL_COMMUNITY): Payer: Self-pay | Admitting: Obstetrics and Gynecology

## 2013-02-10 DIAGNOSIS — R112 Nausea with vomiting, unspecified: Secondary | ICD-10-CM

## 2013-02-10 DIAGNOSIS — K219 Gastro-esophageal reflux disease without esophagitis: Secondary | ICD-10-CM | POA: Insufficient documentation

## 2013-02-10 DIAGNOSIS — R109 Unspecified abdominal pain: Secondary | ICD-10-CM | POA: Insufficient documentation

## 2013-02-10 DIAGNOSIS — O99891 Other specified diseases and conditions complicating pregnancy: Secondary | ICD-10-CM | POA: Insufficient documentation

## 2013-02-10 DIAGNOSIS — R52 Pain, unspecified: Secondary | ICD-10-CM

## 2013-02-10 DIAGNOSIS — Z3201 Encounter for pregnancy test, result positive: Secondary | ICD-10-CM

## 2013-02-10 DIAGNOSIS — R1013 Epigastric pain: Secondary | ICD-10-CM

## 2013-02-10 LAB — POCT URINALYSIS DIP (DEVICE)
Hgb urine dipstick: NEGATIVE
Leukocytes, UA: NEGATIVE
Nitrite: NEGATIVE
Urobilinogen, UA: 0.2 mg/dL (ref 0.0–1.0)
pH: 5.5 (ref 5.0–8.0)

## 2013-02-10 MED ORDER — GI COCKTAIL ~~LOC~~
30.0000 mL | Freq: Once | ORAL | Status: AC
  Administered 2013-02-10: 30 mL via ORAL
  Filled 2013-02-10: qty 30

## 2013-02-10 MED ORDER — FAMOTIDINE 20 MG PO TABS
20.0000 mg | ORAL_TABLET | Freq: Once | ORAL | Status: AC
  Administered 2013-02-10: 20 mg via ORAL
  Filled 2013-02-10: qty 1

## 2013-02-10 MED ORDER — PANTOPRAZOLE SODIUM 20 MG PO TBEC: 20.0000 mg | DELAYED_RELEASE_TABLET | Freq: Every day | ORAL | Status: DC

## 2013-02-10 NOTE — MAU Provider Note (Signed)
History     CSN: [id number]  Arrival date and time: 02/10/13 4098   First Provider Initiated Contact with Patient 02/10/13 1905      Chief Complaint  Patient presents with  . Abdominal Pain   HPI Ms. Courtney [name] is a 22 y.o. G3P2002 at [redacted]w[redacted]d who presents to MAU today after being sent from Urgent Care for upper abdominal pain. The patient had +UPT at Urgent Care. She has some associate nausea without vomiting. She denies heartburn, fever, lower abdominal pain, vaginal bleeding, discharge or UTI symptoms.    OB History   Grav Para Term Preterm Abortions TAB SAB Ect Mult Living   3 2 2  0 0 0 0 0 0 2      Past Medical History  Diagnosis Date  . No pertinent past medical history   . Gall stones     Past Surgical History  Procedure Laterality Date  . No past surgeries    . Gallstones removed      No family history on file.  History  Substance Use Topics  . Smoking status: Current Every Day Smoker -- 0.40 packs/day for 2 years    Types: Cigarettes    Last Attempt to Quit: 12/13/2011  . Smokeless tobacco: Not on file  . Alcohol Use: No    Allergies: No Known Allergies  No prescriptions prior to admission    Review of Systems  Constitutional: Negative for fever, chills and malaise/fatigue.  Gastrointestinal: Positive for nausea and abdominal pain. Negative for heartburn, vomiting and diarrhea.  Genitourinary: Negative for dysuria, urgency and frequency.       Neg - vaginal bleeding, discharge   Physical Exam   Blood pressure 106/69, pulse 102, temperature 98.7 F (37.1 C), temperature source Oral, resp. rate 18, last menstrual period 12/23/2012.  Physical Exam  Constitutional: She is oriented to person, place, and time. She appears well-developed and well-nourished. No distress.  Cardiovascular: Normal rate, regular rhythm and normal heart sounds.   Respiratory: Effort normal and breath sounds normal. No respiratory distress.  GI: Soft. Bowel sounds are  normal. She exhibits no distension and no mass. There is tenderness (mild tenderness to palpation of the epigastric region, most prominent just below the xyphoid process). There is no rebound and no guarding.  Neurological: She is alert and oriented to person, place, and time.  Skin: Skin is warm. No erythema.  Psychiatric: She has a normal mood and affect.   Results for orders placed during the hospital encounter of 02/10/13 (from the past 24 hour(s))  POCT URINALYSIS DIP (DEVICE)     Status: Abnormal   Collection Time    02/10/13  5:29 PM      Result Value Range   Glucose, UA NEGATIVE  NEGATIVE mg/dL   Bilirubin Urine NEGATIVE  NEGATIVE   Ketones, ur 80 (*) NEGATIVE mg/dL   Specific Gravity, Urine >=1.030  1.005 - 1.030   Hgb urine dipstick NEGATIVE  NEGATIVE   pH 5.5  5.0 - 8.0   Protein, ur 30 (*) NEGATIVE mg/dL   Urobilinogen, UA 0.2  0.0 - 1.0 mg/dL   Nitrite NEGATIVE  NEGATIVE   Leukocytes, UA NEGATIVE  NEGATIVE  POCT PREGNANCY, URINE     Status: Abnormal   Collection Time    02/10/13  5:33 PM      Result Value Range   Preg Test, Ur POSITIVE (*) NEGATIVE     MAU Course  Procedures None  MDM GI cocktail and Pepcid given in  MAU. Patient reports significant improvement  Assessment and Plan  A: GERD  P: Discharge home Rx for protonix sent to patient's pharmacy Patient encouraged to follow-up with Dr. Clearance Coots to start prenatal care for this pregnancy as planned Patient may return to MAU as needed or if her condition were to change or worsen  Freddi Starr, PA-C  02/10/2013, 7:05 PM

## 2013-02-10 NOTE — ED Provider Notes (Signed)
History     CSN: [id number]  Arrival date & time 02/10/13  1527   None     Chief Complaint  Patient presents with  . Abdominal Pain    "feels like knot in stomach" unable to eat and drink. missed cycle last month    (Consider location/radiation/quality/duration/timing/severity/associated sxs/prior treatment) HPI Comments: 22 year old female presents with nausea and epigastric pain for 5 days. States he can eat and has had a very poor by mouth intake. Epigastric pain is moderate to severe. She has had no vomiting or diarrhea. She missed her period this month with her last menses 12/23/2012. Her pregnancy test is positive today.   Past Medical History  Diagnosis Date  . No pertinent past medical history   . Gall stones     Past Surgical History  Procedure Laterality Date  . No past surgeries    . Gallstones removed      History reviewed. No pertinent family history.  History  Substance Use Topics  . Smoking status: Current Every Day Smoker -- 0.40 packs/day for 2 years    Types: Cigarettes    Last Attempt to Quit: 12/13/2011  . Smokeless tobacco: Not on file  . Alcohol Use: No    OB History   Grav Para Term Preterm Abortions TAB SAB Ect Mult Living   2 2 2  0 0 0 0 0 0 2      Review of Systems  Constitutional: Positive for chills and appetite change. Negative for fever.  HENT: Negative.   Respiratory: Negative.   Gastrointestinal: Positive for nausea, vomiting and abdominal pain. Negative for diarrhea and blood in stool.  Genitourinary: Negative for flank pain and pelvic pain.  Musculoskeletal: Negative.     Allergies  Review of patient's allergies indicates no known allergies.  Home Medications   Current Outpatient Rx  Name  Route  Sig  Dispense  Refill  . EXPIRED: Cetirizine HCl (ZYRTEC ALLERGY) 10 MG CAPS   Oral   Take 1 capsule (10 mg total) by mouth daily. X 2 weeks   10 capsule   1   . guaiFENesin-codeine (ROBITUSSIN AC) 100-10 MG/5ML syrup  Oral   Take 5 mLs by mouth 3 (three) times daily as needed for cough.   120 mL   0   . Iron-FA-B Cmp-C-Biot-Probiotic (FUSION PLUS) CAPS   Oral   Take 1 capsule by mouth daily before breakfast.   30 capsule   5   . omeprazole (PRILOSEC) 20 MG capsule   Oral   Take 1 capsule (20 mg total) by mouth daily.   14 capsule   0     BP 111/68  Pulse 72  Temp(Src) 98 F (36.7 C) (Oral)  Resp 16  SpO2 100%  LMP 12/23/2012  Physical Exam  Nursing note and vitals reviewed. Constitutional: She is oriented to person, place, and time. She appears well-developed and well-nourished.  HENT:  Oropharynx clear with PND. No exudates No lymphadenopathy. Bilateral TMs are normal  Eyes: Conjunctivae and EOM are normal.  Neck: Normal range of motion. Neck supple.  Cardiovascular: Normal rate and regular rhythm.   Pulmonary/Chest: Effort normal and breath sounds normal. No respiratory distress. She has no wheezes.  Abdominal: She exhibits no distension. There is tenderness. There is guarding.  Moderate to severe tenderness in the epigastrium. Difficult to evaluate. The remainder of the abdomen is soft and nontender. Bowel sounds are normoactive  Lymphadenopathy:    She has no cervical adenopathy.  Neurological: She  is alert and oriented to person, place, and time. She exhibits normal muscle tone.  Skin: Skin is warm and dry.  Psychiatric: She has a normal mood and affect.    ED Course  Procedures (including critical care time)  Labs Reviewed  POCT URINALYSIS DIP (DEVICE) - Abnormal; Notable for the following:    Ketones, ur 80 (*)    Protein, ur 30 (*)    All other components within normal limits  POCT PREGNANCY, URINE - Abnormal; Notable for the following:    Preg Test, Ur POSITIVE (*)    All other components within normal limits   No results found.   1. Abdominal pain, acute, epigastric   2. Nausea & vomiting   3. Pregnancy test positive       MDM  The epigastric  tenderness is severe with light palpation as it produces a withdrawal due to severe pain. She has been vomiting with decreased by mouth intake in the presence of a positive pregnancy test. She is being sent to the [facility] hospital for further evaluation that may include labs, IV fluids, meds and sonogram depending on their evaluation.        Hayden Rasmussen, NP 02/10/13 [phone]

## 2013-02-10 NOTE — ED Notes (Signed)
Pt c/o stomach pain. "feels like knot in stomach" unable to eat.  Pt states missed cycle last month. Symptoms present x 5 days.

## 2013-02-10 NOTE — MAU Note (Signed)
Pt called from the lobby for triage. Pt is outside per registration. Will call again in 15 mins.

## 2013-02-10 NOTE — ED Provider Notes (Signed)
Medical screening examination/treatment/procedure(s) were performed by non-physician practitioner and as supervising physician I was immediately available for consultation/collaboration.   MORENO-COLL,Jurell Basista; MD  Teddie Curd Moreno-Coll, MD 02/10/13 2003 

## 2013-02-10 NOTE — MAU Note (Signed)
Pt presents to MAU with chief complaint of upper abdominal pain. She is [redacted]w[redacted]d; was seen at urgent care today and had a positive pregnancy test. She was sent here for further evaluation.

## 2013-02-19 ENCOUNTER — Encounter: Payer: Self-pay | Admitting: Obstetrics

## 2013-02-19 ENCOUNTER — Ambulatory Visit (INDEPENDENT_AMBULATORY_CARE_PROVIDER_SITE_OTHER): Payer: Medicaid Other | Admitting: Obstetrics

## 2013-02-19 VITALS — BP 113/79 | HR 84 | Temp 98.2°F | Ht 65.0 in | Wt 137.6 lb

## 2013-02-19 DIAGNOSIS — Z3481 Encounter for supervision of other normal pregnancy, first trimester: Secondary | ICD-10-CM

## 2013-02-19 DIAGNOSIS — O210 Mild hyperemesis gravidarum: Secondary | ICD-10-CM | POA: Insufficient documentation

## 2013-02-19 DIAGNOSIS — Z3480 Encounter for supervision of other normal pregnancy, unspecified trimester: Secondary | ICD-10-CM

## 2013-02-19 DIAGNOSIS — D509 Iron deficiency anemia, unspecified: Secondary | ICD-10-CM

## 2013-02-19 MED ORDER — DOXYLAMINE-PYRIDOXINE 10-10 MG PO TBEC: 2.0000 | DELAYED_RELEASE_TABLET | Freq: Every day | ORAL | Status: DC

## 2013-02-19 MED ORDER — SELECT-OB 29-1 MG PO CHEW: 1.0000 | CHEWABLE_TABLET | Freq: Every day | ORAL | Status: DC

## 2013-02-19 NOTE — Progress Notes (Signed)
.   Subjective:     Courtney [name] is a 22 y.o. female here for a problem visit.   Current complaints: Pt is [redacted] weeks pregnant and is unable to eat due to nausea.  Personal health questionnaire reviewed: yes.   Gynecologic History Patient's last menstrual period was 12/23/2012. Contraception: none Last Pap: N/A Last mammogram: N/A  Obstetric History OB History   Grav Para Term Preterm Abortions TAB SAB Ect Mult Living   3 2 2  0 0 0 0 0 0 2     # Outc Date GA Lbr Len/2nd Wgt Sex Del Anes PTL Lv   1 TRM 2009 [redacted]w[redacted]d 16:00 8lb5oz(3.771kg) M SVD EPI  Yes   2 TRM 9/13 [redacted]w[redacted]d 98:40 / 00:19 8lb2.5oz(3.7kg) F SVD EPI  Yes   3 CUR                The following portions of the patient's history were reviewed and updated as appropriate: allergies, current medications, past family history, past medical history, past social history, past surgical history and problem list.  Review of Systems Pertinent items are noted in HPI.    Objective:    General appearance: alert and no distress    Assessment:    Healthy female exam.    Plan:    Follow up in: 4 weeks.

## 2013-02-21 ENCOUNTER — Encounter: Payer: Self-pay | Admitting: Obstetrics & Gynecology

## 2013-03-12 ENCOUNTER — Inpatient Hospital Stay (HOSPITAL_COMMUNITY)
Admission: AD | Admit: 2013-03-12 | Discharge: 2013-03-12 | Disposition: A | Discharge status: Home / Self Care | Payer: Medicaid Other | Source: Ambulatory Visit | Attending: Obstetrics | Admitting: Obstetrics

## 2013-03-12 ENCOUNTER — Encounter (HOSPITAL_COMMUNITY): Payer: Self-pay | Admitting: *Deleted

## 2013-03-12 DIAGNOSIS — O9989 Other specified diseases and conditions complicating pregnancy, childbirth and the puerperium: Secondary | ICD-10-CM

## 2013-03-12 DIAGNOSIS — N949 Unspecified condition associated with female genital organs and menstrual cycle: Secondary | ICD-10-CM

## 2013-03-12 DIAGNOSIS — O99891 Other specified diseases and conditions complicating pregnancy: Secondary | ICD-10-CM | POA: Insufficient documentation

## 2013-03-12 DIAGNOSIS — R109 Unspecified abdominal pain: Secondary | ICD-10-CM | POA: Insufficient documentation

## 2013-03-12 DIAGNOSIS — O26899 Other specified pregnancy related conditions, unspecified trimester: Secondary | ICD-10-CM

## 2013-03-12 LAB — URINALYSIS, ROUTINE W REFLEX MICROSCOPIC
Bilirubin Urine: NEGATIVE
Glucose, UA: NEGATIVE mg/dL
Hgb urine dipstick: NEGATIVE
Ketones, ur: NEGATIVE mg/dL
Protein, ur: NEGATIVE mg/dL
Urobilinogen, UA: 1 mg/dL (ref 0.0–1.0)

## 2013-03-12 NOTE — MAU Note (Signed)
PT SAYS SHE GOES TO [facility]  TO GET [facility]-  SEEN LAST 02-27-2013.  TO RETURN ON 03-19-2013.     SAYS SHE HAS ABD PAIN- SAME AS ON 5-20- DR HARPER GAVE HER VITAMINS- AND IT FELT LESS PAINFUL.   SHE SAYS THE PAIN IS WORSE WHEN SHE GETS UP TO MOVE.   LAST SEX -  YESTERDAY- NO PAIN.    NO VAG BLEEDING.  DENIES HSV AND MRSA.

## 2013-03-12 NOTE — MAU Note (Signed)
Pt G3 P2 at 11.2wks having lower abd pain.  Denies bleeding or discharge.
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
## 2013-03-29 ENCOUNTER — Encounter: Payer: Self-pay | Admitting: Obstetrics

## 2013-03-29 ENCOUNTER — Ambulatory Visit (INDEPENDENT_AMBULATORY_CARE_PROVIDER_SITE_OTHER): Payer: Medicaid Other | Admitting: Obstetrics

## 2013-03-29 VITALS — BP 115/77 | Temp 97.8°F | Wt 135.6 lb

## 2013-03-29 DIAGNOSIS — N760 Acute vaginitis: Secondary | ICD-10-CM

## 2013-03-29 DIAGNOSIS — Z113 Encounter for screening for infections with a predominantly sexual mode of transmission: Secondary | ICD-10-CM

## 2013-03-29 DIAGNOSIS — Z3201 Encounter for pregnancy test, result positive: Secondary | ICD-10-CM

## 2013-03-29 DIAGNOSIS — Z3480 Encounter for supervision of other normal pregnancy, unspecified trimester: Secondary | ICD-10-CM

## 2013-03-29 LAB — POCT URINALYSIS DIPSTICK
Bilirubin, UA: NEGATIVE
Blood, UA: NEGATIVE
Nitrite, UA: NEGATIVE
Spec Grav, UA: >=1.03
Urobilinogen, UA: NEGATIVE

## 2013-03-29 LAB — OB RESULTS CONSOLE GC/CHLAMYDIA: Gonorrhea: NEGATIVE

## 2013-03-29 NOTE — Progress Notes (Signed)
Pulse-91    Subjective:    Courtney [name] is being seen today for her first obstetrical visit.  This is not a planned pregnancy. She is at [redacted]w[redacted]d gestation. Her obstetrical history is significant for none. Relationship with FOB: significant other, living together. Patient undecided intend to breast feed. Pregnancy history fully reviewed.  Menstrual History: OB History   Grav Para Term Preterm Abortions TAB SAB Ect Mult Living   3 2 2  0 0 0 0 0 0 2      Menarche age: 49  Patient's last menstrual period was 12/23/2012.    The following portions of the patient's history were reviewed and updated as appropriate: allergies, current medications, past family history, past medical history, past social history, past surgical history and problem list.  Review of Systems Pertinent items are noted in HPI.    Objective:    General appearance: alert and no distress Abdomen: normal findings: soft, non-tender Pelvic: cervix normal in appearance, external genitalia normal, no adnexal masses or tenderness, no cervical motion tenderness, vagina normal without discharge and uterus enlarged, soft, NT.    Assessment:    Pregnancy at [redacted]w[redacted]d weeks    Plan:    Initial labs drawn. Prenatal vitamins. Problem list reviewed and updated. AFP3 discussed: requested. Role of ultrasound in pregnancy discussed; fetal survey: requested. Amniocentesis discussed: not indicated. Follow up in 3 weeks. 50% of 20 min visit spent on counseling and coordination of care.

## 2013-03-30 ENCOUNTER — Encounter: Payer: Self-pay | Admitting: Obstetrics

## 2013-03-30 LAB — WET PREP BY MOLECULAR PROBE
Gardnerella vaginalis: NEGATIVE
Trichomonas vaginosis: NEGATIVE

## 2013-03-30 LAB — OBSTETRIC PANEL
Eosinophils Absolute: 0.2 10*3/uL (ref 0.0–0.7)
Hemoglobin: 12.3 g/dL (ref 12.0–15.0)
Hepatitis B Surface Ag: NEGATIVE
Lymphocytes Relative: 29 % (ref 12–46)
Lymphs Abs: 2.3 10*3/uL (ref 0.7–4.0)
MCH: 30 pg (ref 26.0–34.0)
Monocytes Relative: 5 % (ref 3–12)
Neutrophils Relative %: 64 % (ref 43–77)
Platelets: 299 10*3/uL (ref 150–400)
RBC: 4.1 MIL/uL (ref 3.87–5.11)
Rh Type: POSITIVE
Rubella: 1.49 Index — ABNORMAL HIGH (ref <0.90)
WBC: 7.8 10*3/uL (ref 4.0–10.5)

## 2013-03-30 LAB — GC/CHLAMYDIA PROBE AMP: CT Probe RNA: NEGATIVE

## 2013-03-31 LAB — URINE CULTURE: Colony Count: 15000

## 2013-04-02 LAB — HEMOGLOBINOPATHY EVALUATION
Hemoglobin Other: 0 %
Hgb S Quant: 0 %

## 2013-04-17 ENCOUNTER — Encounter: Payer: Self-pay | Admitting: Obstetrics

## 2013-04-17 ENCOUNTER — Ambulatory Visit (INDEPENDENT_AMBULATORY_CARE_PROVIDER_SITE_OTHER): Payer: Medicaid Other | Admitting: Obstetrics

## 2013-04-17 VITALS — BP 98/65 | Temp 98.6°F | Wt 136.2 lb

## 2013-04-17 DIAGNOSIS — L259 Unspecified contact dermatitis, unspecified cause: Secondary | ICD-10-CM

## 2013-04-17 DIAGNOSIS — L309 Dermatitis, unspecified: Secondary | ICD-10-CM

## 2013-04-17 DIAGNOSIS — Z3482 Encounter for supervision of other normal pregnancy, second trimester: Secondary | ICD-10-CM

## 2013-04-17 DIAGNOSIS — Z369 Encounter for antenatal screening, unspecified: Secondary | ICD-10-CM

## 2013-04-17 DIAGNOSIS — Z3480 Encounter for supervision of other normal pregnancy, unspecified trimester: Secondary | ICD-10-CM

## 2013-04-17 LAB — POCT URINALYSIS DIPSTICK
Bilirubin, UA: NEGATIVE
Blood, UA: NEGATIVE
Glucose, UA: NEGATIVE
Leukocytes, UA: NEGATIVE
Nitrite, UA: NEGATIVE
Urobilinogen, UA: NEGATIVE

## 2013-04-17 MED ORDER — HYDROCORTISONE 2.5 % EX CREA: TOPICAL_CREAM | Freq: Two times a day (BID) | CUTANEOUS | Status: DC

## 2013-04-17 NOTE — Progress Notes (Signed)
Pulse: 90

## 2013-04-18 LAB — AFP, QUAD SCREEN
AFP: 25.1 IU/mL
Age Alone: 1:1180 {titer}
HCG, Total: 37807 m[IU]/mL
MoM for AFP: 0.66
Open Spina bifida: NEGATIVE
Trisomy 18 (Edward) Syndrome Interp.: 1:30300 {titer}
uE3 Value: 0.4 ng/mL

## 2013-05-02 ENCOUNTER — Inpatient Hospital Stay (HOSPITAL_COMMUNITY)
Admission: AD | Admit: 2013-05-02 | Discharge: 2013-05-02 | Disposition: A | Discharge status: Home / Self Care | Payer: Medicaid Other | Source: Ambulatory Visit | Attending: Obstetrics & Gynecology | Admitting: Obstetrics & Gynecology

## 2013-05-02 ENCOUNTER — Encounter (HOSPITAL_COMMUNITY): Payer: Self-pay | Admitting: *Deleted

## 2013-05-02 DIAGNOSIS — O99891 Other specified diseases and conditions complicating pregnancy: Secondary | ICD-10-CM | POA: Insufficient documentation

## 2013-05-02 DIAGNOSIS — Y92009 Unspecified place in unspecified non-institutional (private) residence as the place of occurrence of the external cause: Secondary | ICD-10-CM | POA: Insufficient documentation

## 2013-05-02 DIAGNOSIS — R197 Diarrhea, unspecified: Secondary | ICD-10-CM | POA: Insufficient documentation

## 2013-05-02 DIAGNOSIS — R109 Unspecified abdominal pain: Secondary | ICD-10-CM

## 2013-05-02 HISTORY — DX: Dermatitis, unspecified: L30.9

## 2013-05-02 LAB — CBC
HCT: 26.9 % — ABNORMAL LOW (ref 36.0–46.0)
Hemoglobin: 9.3 g/dL — ABNORMAL LOW (ref 12.0–15.0)
WBC: 9.5 10*3/uL (ref 4.0–10.5)

## 2013-05-02 LAB — URINALYSIS, ROUTINE W REFLEX MICROSCOPIC
Glucose, UA: NEGATIVE mg/dL
Hgb urine dipstick: NEGATIVE
Leukocytes, UA: NEGATIVE
pH: 7.5 (ref 5.0–8.0)

## 2013-05-02 MED ORDER — HYDROCORTISONE 2.5 % EX CREA: TOPICAL_CREAM | Freq: Two times a day (BID) | CUTANEOUS | Status: DC

## 2013-05-02 MED ORDER — GI COCKTAIL ~~LOC~~: 30.0000 mL | Freq: Once | ORAL | Status: DC
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
## 2013-05-02 NOTE — MAU Note (Signed)
Social worker called, msg left.

## 2013-05-02 NOTE — MAU Note (Addendum)
Pain in lower abd - cramping, started last night. No bleeding. Was able to get some sleep last night.  abd soft, no guarding.
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
## 2013-05-15 ENCOUNTER — Ambulatory Visit (INDEPENDENT_AMBULATORY_CARE_PROVIDER_SITE_OTHER): Payer: Medicaid Other

## 2013-05-15 ENCOUNTER — Encounter: Payer: Medicaid Other | Admitting: Obstetrics

## 2013-05-15 DIAGNOSIS — Z3400 Encounter for supervision of normal first pregnancy, unspecified trimester: Secondary | ICD-10-CM

## 2013-05-15 DIAGNOSIS — Z369 Encounter for antenatal screening, unspecified: Secondary | ICD-10-CM

## 2013-05-16 ENCOUNTER — Encounter: Payer: Self-pay | Admitting: Obstetrics & Gynecology

## 2013-05-16 LAB — US OB DETAIL + 14 WK

## 2013-05-18 ENCOUNTER — Encounter: Payer: Medicaid Other | Admitting: Advanced Practice Midwife

## 2013-05-22 ENCOUNTER — Encounter: Payer: Medicaid Other | Admitting: Obstetrics

## 2013-05-31 ENCOUNTER — Ambulatory Visit (INDEPENDENT_AMBULATORY_CARE_PROVIDER_SITE_OTHER): Payer: Medicaid Other | Admitting: Obstetrics

## 2013-05-31 ENCOUNTER — Encounter: Payer: Self-pay | Admitting: Obstetrics

## 2013-05-31 VITALS — BP 102/64 | Temp 98.9°F | Wt 141.0 lb

## 2013-05-31 DIAGNOSIS — Z3480 Encounter for supervision of other normal pregnancy, unspecified trimester: Secondary | ICD-10-CM

## 2013-05-31 DIAGNOSIS — Z3482 Encounter for supervision of other normal pregnancy, second trimester: Secondary | ICD-10-CM

## 2013-05-31 LAB — POCT URINALYSIS DIPSTICK
Blood, UA: NEGATIVE
Nitrite, UA: NEGATIVE
Protein, UA: NEGATIVE
Urobilinogen, UA: NEGATIVE
pH, UA: 5

## 2013-05-31 NOTE — Progress Notes (Signed)
Pulse- 101 

## 2013-06-19 ENCOUNTER — Other Ambulatory Visit: Payer: Medicaid Other

## 2013-06-19 ENCOUNTER — Encounter: Payer: Medicaid Other | Admitting: Obstetrics

## 2013-06-21 ENCOUNTER — Ambulatory Visit (INDEPENDENT_AMBULATORY_CARE_PROVIDER_SITE_OTHER): Payer: Medicaid Other | Admitting: Obstetrics

## 2013-06-21 ENCOUNTER — Other Ambulatory Visit: Payer: Medicaid Other

## 2013-06-21 ENCOUNTER — Encounter: Payer: Self-pay | Admitting: Obstetrics

## 2013-06-21 VITALS — BP 103/69 | Temp 98.3°F | Wt 147.0 lb

## 2013-06-21 DIAGNOSIS — Z3480 Encounter for supervision of other normal pregnancy, unspecified trimester: Secondary | ICD-10-CM

## 2013-06-21 DIAGNOSIS — Z3482 Encounter for supervision of other normal pregnancy, second trimester: Secondary | ICD-10-CM

## 2013-06-21 LAB — POCT URINALYSIS DIPSTICK
Blood, UA: NEGATIVE
Glucose, UA: NEGATIVE
Protein, UA: NEGATIVE
Spec Grav, UA: 1.015
Urobilinogen, UA: NEGATIVE

## 2013-06-21 LAB — CBC
Hemoglobin: 10.7 g/dL — ABNORMAL LOW (ref 12.0–15.0)
MCH: 31.5 pg (ref 26.0–34.0)
RBC: 3.4 MIL/uL — ABNORMAL LOW (ref 3.87–5.11)

## 2013-06-21 NOTE — Addendum Note (Signed)
Addended by: Glendell Docker on: 06/21/2013 11:45 AM   Modules accepted: Orders

## 2013-06-21 NOTE — Progress Notes (Signed)
p- 91 Tightening in abdomen yesterday that was relieved by tylenol and

## 2013-06-22 LAB — GLUCOSE TOLERANCE, 2 HOURS W/ 1HR: Glucose, 1 hour: 153 mg/dL (ref 70–170)

## 2013-06-22 LAB — HIV ANTIBODY (ROUTINE TESTING W REFLEX): HIV: NONREACTIVE

## 2013-06-22 LAB — RPR

## 2013-06-24 ENCOUNTER — Inpatient Hospital Stay (HOSPITAL_COMMUNITY)
Admission: AD | Admit: 2013-06-24 | Discharge: 2013-06-24 | Disposition: A | Discharge status: Home / Self Care | Payer: No Typology Code available for payment source | Source: Ambulatory Visit | Attending: Obstetrics | Admitting: Obstetrics

## 2013-06-24 ENCOUNTER — Encounter (HOSPITAL_COMMUNITY): Payer: Self-pay | Admitting: *Deleted

## 2013-06-24 DIAGNOSIS — Z041 Encounter for examination and observation following transport accident: Secondary | ICD-10-CM

## 2013-06-24 DIAGNOSIS — M542 Cervicalgia: Secondary | ICD-10-CM

## 2013-06-24 DIAGNOSIS — M545 Low back pain, unspecified: Secondary | ICD-10-CM

## 2013-06-24 DIAGNOSIS — Z043 Encounter for examination and observation following other accident: Secondary | ICD-10-CM

## 2013-06-24 DIAGNOSIS — O99891 Other specified diseases and conditions complicating pregnancy: Secondary | ICD-10-CM

## 2013-06-24 DIAGNOSIS — T148XXA Other injury of unspecified body region, initial encounter: Secondary | ICD-10-CM

## 2013-06-24 DIAGNOSIS — M549 Dorsalgia, unspecified: Secondary | ICD-10-CM | POA: Insufficient documentation

## 2013-06-24 DIAGNOSIS — Y92410 Unspecified street and highway as the place of occurrence of the external cause: Secondary | ICD-10-CM | POA: Insufficient documentation

## 2013-06-24 MED ORDER — CYCLOBENZAPRINE HCL 10 MG PO TABS
10.0000 mg | ORAL_TABLET | Freq: Once | ORAL | Status: AC
  Administered 2013-06-24: 10 mg via ORAL
  Filled 2013-06-24: qty 1

## 2013-06-24 MED ORDER — CYCLOBENZAPRINE HCL 10 MG PO TABS: 10.0000 mg | ORAL_TABLET | Freq: Three times a day (TID) | ORAL | Status: DC | PRN
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
## 2013-07-01 ENCOUNTER — Telehealth: Payer: Self-pay | Admitting: *Deleted

## 2013-07-01 MED ORDER — FUSION PLUS PO CAPS: 1.0000 | ORAL_CAPSULE | Freq: Every day | ORAL | Status: DC

## 2013-07-01 NOTE — Telephone Encounter (Signed)
Call placed to patient at [phone]. She was informed of Fusion Plus per Dr Clearance Coots instructions. Patient was advised to follow up on 07/04/2013 if there are any problems with the iron rx. Patient verbalized understanding and agrees as instructed.

## 2013-07-01 NOTE — Telephone Encounter (Signed)
Message copied by Glendell Docker on Sun Jul 01, 2013  2:25 PM ------      Message from: Coral Ceo A      Created: Fri Jun 22, 2013  3:55 PM       Fusion Plus 1 po daily. ------

## 2013-07-04 ENCOUNTER — Encounter: Payer: Medicaid Other | Admitting: Obstetrics

## 2013-07-05 ENCOUNTER — Encounter: Payer: Medicaid Other | Admitting: Obstetrics

## 2013-07-26 ENCOUNTER — Ambulatory Visit (INDEPENDENT_AMBULATORY_CARE_PROVIDER_SITE_OTHER): Payer: Medicaid Other | Admitting: Obstetrics

## 2013-07-26 ENCOUNTER — Encounter: Payer: Self-pay | Admitting: Obstetrics

## 2013-07-26 ENCOUNTER — Ambulatory Visit: Payer: Medicaid Other | Admitting: Obstetrics

## 2013-07-26 VITALS — BP 105/69 | Temp 99.0°F | Wt 157.0 lb

## 2013-07-26 DIAGNOSIS — Z3483 Encounter for supervision of other normal pregnancy, third trimester: Secondary | ICD-10-CM

## 2013-07-26 DIAGNOSIS — Z3480 Encounter for supervision of other normal pregnancy, unspecified trimester: Secondary | ICD-10-CM

## 2013-07-26 LAB — POCT URINALYSIS DIPSTICK
Glucose, UA: NEGATIVE
Nitrite, UA: NEGATIVE
Spec Grav, UA: 1.01
Urobilinogen, UA: NEGATIVE

## 2013-07-26 NOTE — Progress Notes (Signed)
P 103 Patient reports she is doing well. 

## 2013-08-09 ENCOUNTER — Encounter: Payer: Medicaid Other | Admitting: Obstetrics

## 2013-08-09 ENCOUNTER — Ambulatory Visit (INDEPENDENT_AMBULATORY_CARE_PROVIDER_SITE_OTHER): Payer: Medicaid Other | Admitting: Obstetrics

## 2013-08-09 ENCOUNTER — Encounter: Payer: Self-pay | Admitting: Obstetrics

## 2013-08-09 VITALS — BP 109/74 | Temp 97.9°F | Wt 152.0 lb

## 2013-08-09 DIAGNOSIS — Z3480 Encounter for supervision of other normal pregnancy, unspecified trimester: Secondary | ICD-10-CM

## 2013-08-09 DIAGNOSIS — Z3483 Encounter for supervision of other normal pregnancy, third trimester: Secondary | ICD-10-CM

## 2013-08-09 LAB — POCT URINALYSIS DIPSTICK
Blood, UA: NEGATIVE
Glucose, UA: 50
Ketones, UA: NEGATIVE
Leukocytes, UA: NEGATIVE
Nitrite, UA: NEGATIVE
Protein, UA: NEGATIVE
Spec Grav, UA: 1.02
pH, UA: 6

## 2013-08-09 NOTE — Progress Notes (Signed)
Pulse- 101 Pt states she is having contractions in the evenings. No pattern.

## 2013-08-10 ENCOUNTER — Inpatient Hospital Stay (HOSPITAL_COMMUNITY)
Admission: EM | Admit: 2013-08-10 | Discharge: 2013-08-13 | DRG: 781 | Disposition: A | Discharge status: Home / Self Care | Payer: Medicaid Other | Attending: Obstetrics | Admitting: Obstetrics

## 2013-08-10 ENCOUNTER — Emergency Department (HOSPITAL_COMMUNITY): Payer: Medicaid Other

## 2013-08-10 ENCOUNTER — Encounter (HOSPITAL_COMMUNITY): Payer: Self-pay | Admitting: Emergency Medicine

## 2013-08-10 DIAGNOSIS — O4700 False labor before 37 completed weeks of gestation, unspecified trimester: Secondary | ICD-10-CM | POA: Diagnosis present

## 2013-08-10 DIAGNOSIS — S0510XA Contusion of eyeball and orbital tissues, unspecified eye, initial encounter: Secondary | ICD-10-CM | POA: Diagnosis present

## 2013-08-10 DIAGNOSIS — S0280XA Fracture of other specified skull and facial bones, unspecified side, initial encounter for closed fracture: Secondary | ICD-10-CM | POA: Diagnosis present

## 2013-08-10 DIAGNOSIS — Y92009 Unspecified place in unspecified non-institutional (private) residence as the place of occurrence of the external cause: Secondary | ICD-10-CM

## 2013-08-10 DIAGNOSIS — S0003XA Contusion of scalp, initial encounter: Secondary | ICD-10-CM | POA: Diagnosis present

## 2013-08-10 DIAGNOSIS — J342 Deviated nasal septum: Secondary | ICD-10-CM | POA: Diagnosis present

## 2013-08-10 DIAGNOSIS — S0285XA Fracture of orbit, unspecified, initial encounter for closed fracture: Secondary | ICD-10-CM

## 2013-08-10 DIAGNOSIS — R109 Unspecified abdominal pain: Secondary | ICD-10-CM | POA: Diagnosis present

## 2013-08-10 DIAGNOSIS — O99891 Other specified diseases and conditions complicating pregnancy: Principal | ICD-10-CM | POA: Diagnosis present

## 2013-08-10 MED ORDER — PRENATAL MULTIVITAMIN CH
1.0000 | ORAL_TABLET | Freq: Every day | ORAL | Status: DC
  Administered 2013-08-11 – 2013-08-13 (×3): 1 via ORAL
  Filled 2013-08-10 (×3): qty 1

## 2013-08-10 MED ORDER — OXYCODONE-ACETAMINOPHEN 5-325 MG PO TABS
2.0000 | ORAL_TABLET | ORAL | Status: DC | PRN
  Administered 2013-08-11 (×2): 2 via ORAL
  Filled 2013-08-10 (×2): qty 2

## 2013-08-10 MED ORDER — MORPHINE SULFATE 10 MG/ML IJ SOLN
10.0000 mg | Freq: Once | INTRAMUSCULAR | Status: AC
  Administered 2013-08-11: 10 mg via INTRAMUSCULAR
  Filled 2013-08-10: qty 1

## 2013-08-10 MED ORDER — PROMETHAZINE HCL 25 MG PO TABS
25.0000 mg | ORAL_TABLET | Freq: Once | ORAL | Status: AC
  Administered 2013-08-10: 25 mg via ORAL
  Filled 2013-08-10: qty 1

## 2013-08-10 MED ORDER — LACTATED RINGERS IV BOLUS (SEPSIS)
1000.0000 mL | Freq: Once | INTRAVENOUS | Status: AC
  Administered 2013-08-10: 1000 mL via INTRAVENOUS

## 2013-08-10 MED ORDER — OXYCODONE-ACETAMINOPHEN 5-325 MG PO TABS: 1.0000 | ORAL_TABLET | Freq: Three times a day (TID) | ORAL | Status: DC | PRN

## 2013-08-10 MED ORDER — DOCUSATE SODIUM 100 MG PO CAPS
100.0000 mg | ORAL_CAPSULE | Freq: Every day | ORAL | Status: DC
  Administered 2013-08-11 – 2013-08-13 (×3): 100 mg via ORAL
  Filled 2013-08-10 (×3): qty 1

## 2013-08-10 MED ORDER — OXYCODONE-ACETAMINOPHEN 5-325 MG PO TABS: 1.0000 | ORAL_TABLET | ORAL | Status: DC | PRN

## 2013-08-10 MED ORDER — LACTATED RINGERS IV SOLN
INTRAVENOUS | Status: DC
Start: 2013-08-10 — End: 2013-08-13
  Administered 2013-08-10 – 2013-08-11 (×4): via INTRAVENOUS

## 2013-08-10 MED ORDER — PROMETHAZINE HCL 25 MG/ML IJ SOLN
25.0000 mg | Freq: Once | INTRAMUSCULAR | Status: AC
  Administered 2013-08-11: 25 mg via INTRAMUSCULAR
  Filled 2013-08-10: qty 1

## 2013-08-10 MED ORDER — TETRACAINE HCL 0.5 % OP SOLN
2.0000 [drp] | Freq: Once | OPHTHALMIC | Status: DC
  Filled 2013-08-10: qty 2

## 2013-08-10 MED ORDER — CALCIUM CARBONATE ANTACID 500 MG PO CHEW: 2.0000 | CHEWABLE_TABLET | ORAL | Status: DC | PRN

## 2013-08-10 MED ORDER — ZOLPIDEM TARTRATE 5 MG PO TABS
5.0000 mg | ORAL_TABLET | Freq: Every evening | ORAL | Status: DC | PRN
  Administered 2013-08-12: 5 mg via ORAL
  Filled 2013-08-10: qty 1

## 2013-08-10 MED ORDER — LACTATED RINGERS IV SOLN: INTRAVENOUS | Status: DC

## 2013-08-10 MED ORDER — ACETAMINOPHEN 325 MG PO TABS: 650.0000 mg | ORAL_TABLET | ORAL | Status: DC | PRN

## 2013-08-10 MED ORDER — OXYCODONE-ACETAMINOPHEN 5-325 MG PO TABS
1.0000 | ORAL_TABLET | Freq: Once | ORAL | Status: AC
  Administered 2013-08-10: 1 via ORAL
  Filled 2013-08-10: qty 1

## 2013-08-10 MED ORDER — SODIUM CHLORIDE 0.9 % IV BOLUS (SEPSIS): 1000.0000 mL | INTRAVENOUS | Status: DC

## 2013-08-10 MED ORDER — FLUORESCEIN SODIUM 1 MG OP STRP
1.0000 | ORAL_STRIP | Freq: Once | OPHTHALMIC | Status: DC
  Filled 2013-08-10: qty 1
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
## 2013-08-10 NOTE — Progress Notes (Signed)
MAU called and report given. Stated would call when [facility] arrives for transport.
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
## 2013-08-10 NOTE — ED Notes (Signed)
RR OBGYN RN at bedside

## 2013-08-11 ENCOUNTER — Inpatient Hospital Stay (HOSPITAL_COMMUNITY): Payer: Medicaid Other

## 2013-08-11 LAB — CBC
Hemoglobin: 8.8 g/dL — ABNORMAL LOW (ref 12.0–15.0)
MCH: 30.8 pg (ref 26.0–34.0)
MCHC: 34.1 g/dL (ref 30.0–36.0)
Platelets: 234 10*3/uL (ref 150–400)
WBC: 11.7 10*3/uL — ABNORMAL HIGH (ref 4.0–10.5)

## 2013-08-11 MED ORDER — HYDROMORPHONE HCL 2 MG PO TABS
4.0000 mg | ORAL_TABLET | Freq: Four times a day (QID) | ORAL | Status: DC | PRN
  Administered 2013-08-11: 4 mg via ORAL
  Filled 2013-08-11 (×2): qty 2

## 2013-08-11 MED ORDER — MORPHINE SULFATE 10 MG/ML IJ SOLN
10.0000 mg | Freq: Four times a day (QID) | INTRAMUSCULAR | Status: DC | PRN
  Administered 2013-08-11 – 2013-08-13 (×3): 10 mg via INTRAMUSCULAR
  Filled 2013-08-11 (×3): qty 1

## 2013-08-11 MED ORDER — PROMETHAZINE HCL 25 MG/ML IJ SOLN
25.0000 mg | Freq: Four times a day (QID) | INTRAMUSCULAR | Status: DC | PRN
  Administered 2013-08-11 – 2013-08-13 (×3): 25 mg via INTRAMUSCULAR
  Filled 2013-08-11 (×3): qty 1

## 2013-08-11 MED ORDER — CYCLOBENZAPRINE HCL 10 MG PO TABS
10.0000 mg | ORAL_TABLET | Freq: Four times a day (QID) | ORAL | Status: DC | PRN
  Administered 2013-08-12: 10 mg via ORAL
  Filled 2013-08-11: qty 1
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
## 2013-08-11 NOTE — Progress Notes (Addendum)
Social work just finished with patient. Pt asking about going outside. Informed pt of MD orders,he just wants to her to be on bedrest with BRP for today. Patient talking on phone while walking down the hall.
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
## 2013-08-12 ENCOUNTER — Inpatient Hospital Stay (HOSPITAL_COMMUNITY): Payer: Medicaid Other
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
## 2013-08-12 NOTE — Progress Notes (Signed)
When RN entered pt's room to check on her she was not in the room.

## 2013-08-13 MED ORDER — HYDROMORPHONE HCL 4 MG PO TABS: 4.0000 mg | ORAL_TABLET | Freq: Four times a day (QID) | ORAL | Status: DC | PRN
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
## 2013-08-13 NOTE — Progress Notes (Signed)
Pt. Stable and ready to be discharged home. I reviewed discharge instructions and prescriptions with patient and she did not have any questions. Pt. Understands when to call the doctor. Pt. Was in a hurry and told me that she had her brother come pick her up. Pt. States that she has a safe place to go with family in [place]. Pt. Said she did not need me to walk her out that she wanted to walk by herself. Pt. States she will follow up with her doctors this week.
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
## 2013-08-23 ENCOUNTER — Ambulatory Visit (INDEPENDENT_AMBULATORY_CARE_PROVIDER_SITE_OTHER): Payer: Medicaid Other | Admitting: Obstetrics

## 2013-08-23 VITALS — BP 102/64 | Temp 98.1°F | Wt 156.2 lb

## 2013-08-23 DIAGNOSIS — Z3483 Encounter for supervision of other normal pregnancy, third trimester: Secondary | ICD-10-CM

## 2013-08-23 DIAGNOSIS — Z3480 Encounter for supervision of other normal pregnancy, unspecified trimester: Secondary | ICD-10-CM

## 2013-08-23 LAB — POCT URINALYSIS DIPSTICK
Bilirubin, UA: NEGATIVE
Blood, UA: NEGATIVE
Ketones, UA: NEGATIVE
Spec Grav, UA: 1.015
Urobilinogen, UA: NEGATIVE

## 2013-08-23 NOTE — Progress Notes (Signed)
Pulse: 83

## 2013-08-24 ENCOUNTER — Encounter: Payer: Self-pay | Admitting: Obstetrics

## 2013-08-30 ENCOUNTER — Ambulatory Visit (INDEPENDENT_AMBULATORY_CARE_PROVIDER_SITE_OTHER): Payer: Medicaid Other | Admitting: Obstetrics

## 2013-08-30 VITALS — BP 110/75 | Temp 98.0°F | Wt 152.0 lb

## 2013-08-30 DIAGNOSIS — Z3483 Encounter for supervision of other normal pregnancy, third trimester: Secondary | ICD-10-CM

## 2013-08-30 DIAGNOSIS — Z3480 Encounter for supervision of other normal pregnancy, unspecified trimester: Secondary | ICD-10-CM

## 2013-08-30 LAB — POCT URINALYSIS DIPSTICK
Bilirubin, UA: NEGATIVE
Glucose, UA: NEGATIVE
Ketones, UA: NEGATIVE
Leukocytes, UA: NEGATIVE
Spec Grav, UA: <=1.005

## 2013-08-30 NOTE — Progress Notes (Signed)
Pulse 100  Pt states that she is having some pain and pressure in her lower abdomen.

## 2013-08-31 ENCOUNTER — Encounter: Payer: Self-pay | Admitting: Obstetrics

## 2013-09-01 LAB — STREP B DNA PROBE: GBSP: NEGATIVE

## 2013-09-05 ENCOUNTER — Encounter: Payer: Medicaid Other | Admitting: Obstetrics

## 2013-09-13 ENCOUNTER — Ambulatory Visit (INDEPENDENT_AMBULATORY_CARE_PROVIDER_SITE_OTHER): Payer: Medicaid Other | Admitting: Obstetrics

## 2013-09-13 ENCOUNTER — Encounter: Payer: Self-pay | Admitting: Obstetrics

## 2013-09-13 VITALS — BP 108/70 | Temp 98.6°F | Wt 156.0 lb

## 2013-09-13 DIAGNOSIS — Z3483 Encounter for supervision of other normal pregnancy, third trimester: Secondary | ICD-10-CM

## 2013-09-13 DIAGNOSIS — Z3480 Encounter for supervision of other normal pregnancy, unspecified trimester: Secondary | ICD-10-CM

## 2013-09-13 LAB — POCT URINALYSIS DIPSTICK
Bilirubin, UA: NEGATIVE
Blood, UA: NEGATIVE
Nitrite, UA: NEGATIVE
Urobilinogen, UA: NEGATIVE

## 2013-09-13 NOTE — Progress Notes (Signed)
Pulse- 116 Pt states she is having pelvic pressure.

## 2013-09-15 IMAGING — US US FETAL BPP W/O NONSTRESS
1 series · 9 of 9 positions shown · non-contrast
Comparison: none

[Series 1: us fetal bpp w/o nonstress · non-contrast · 9 acquisitions, 9 frames shown]
[im 1/9]
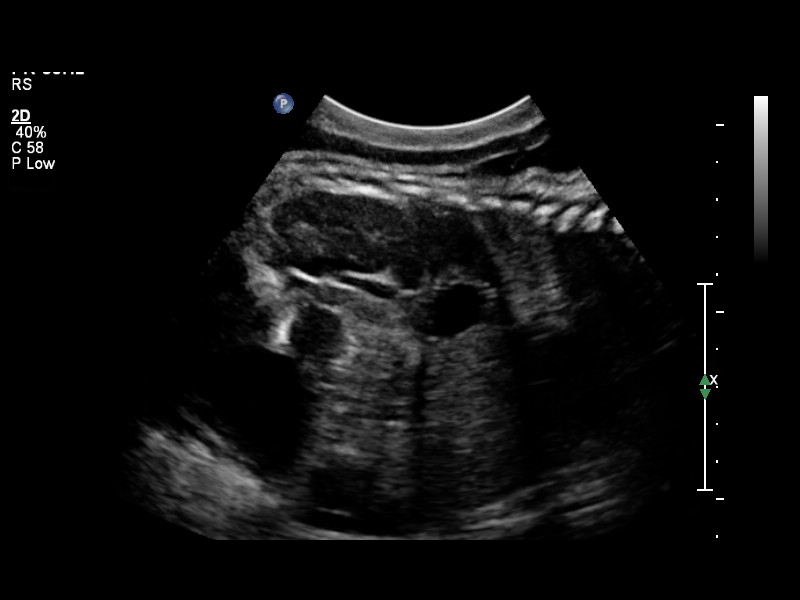
[im 2/9]
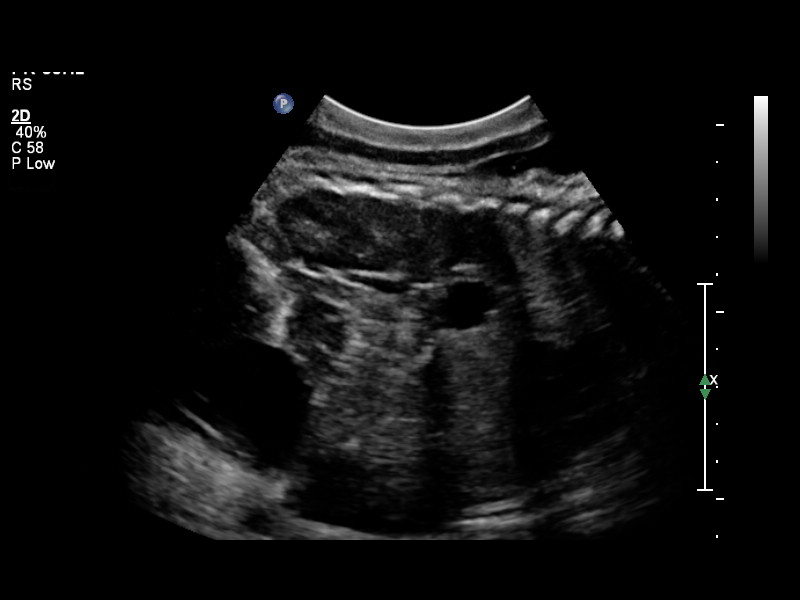
[im 3/9]
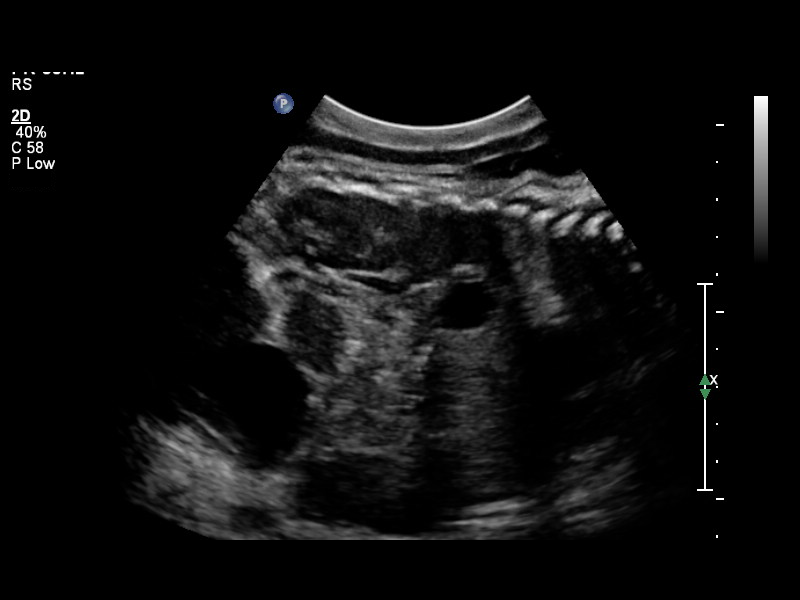
[im 4/9]
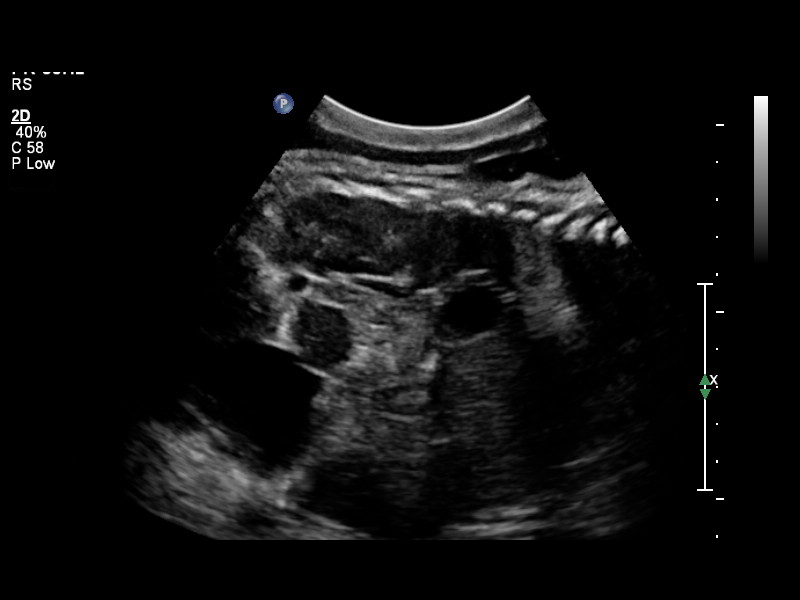
[im 5/9]
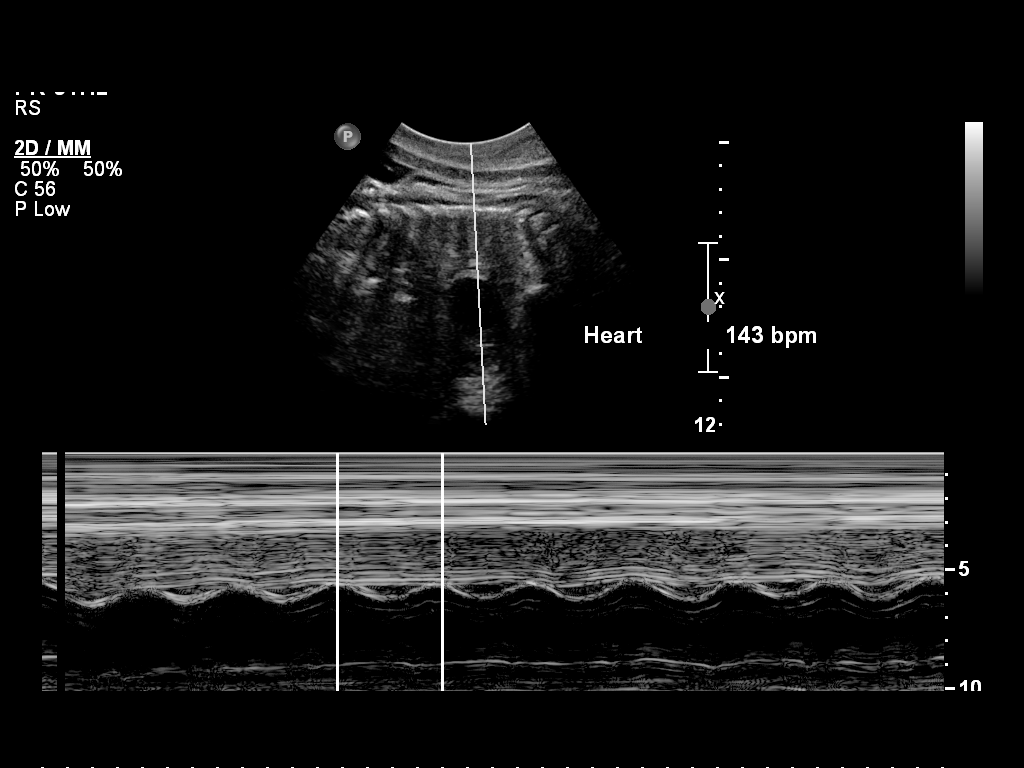
[im 6/9]
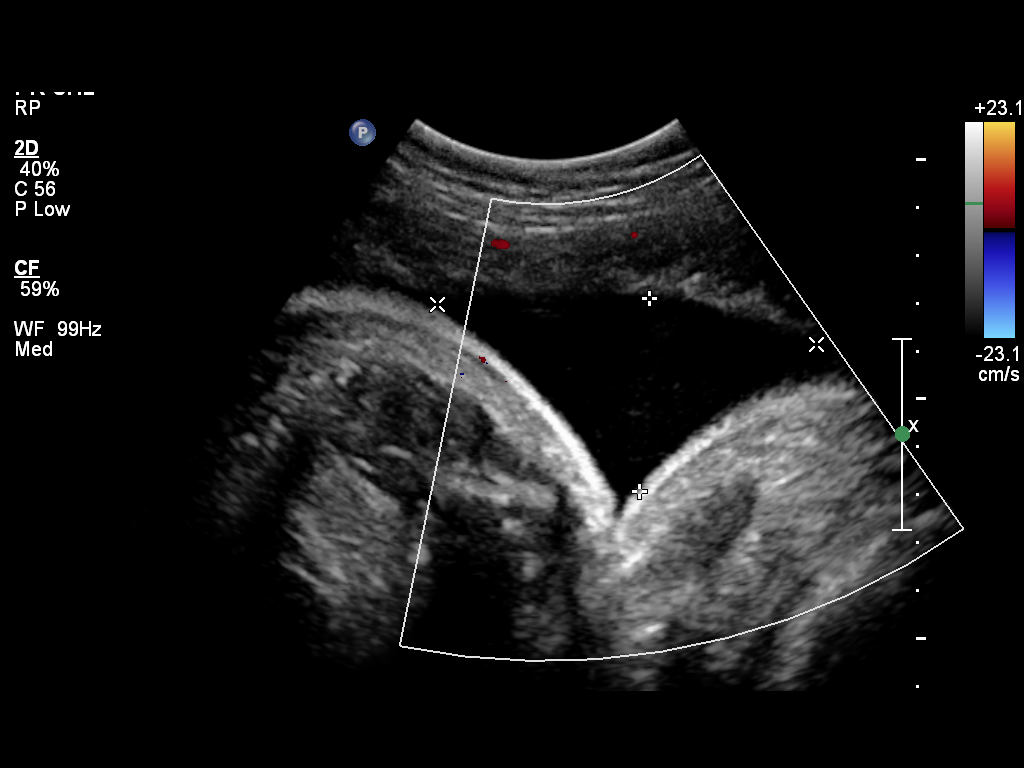
[im 7/9]
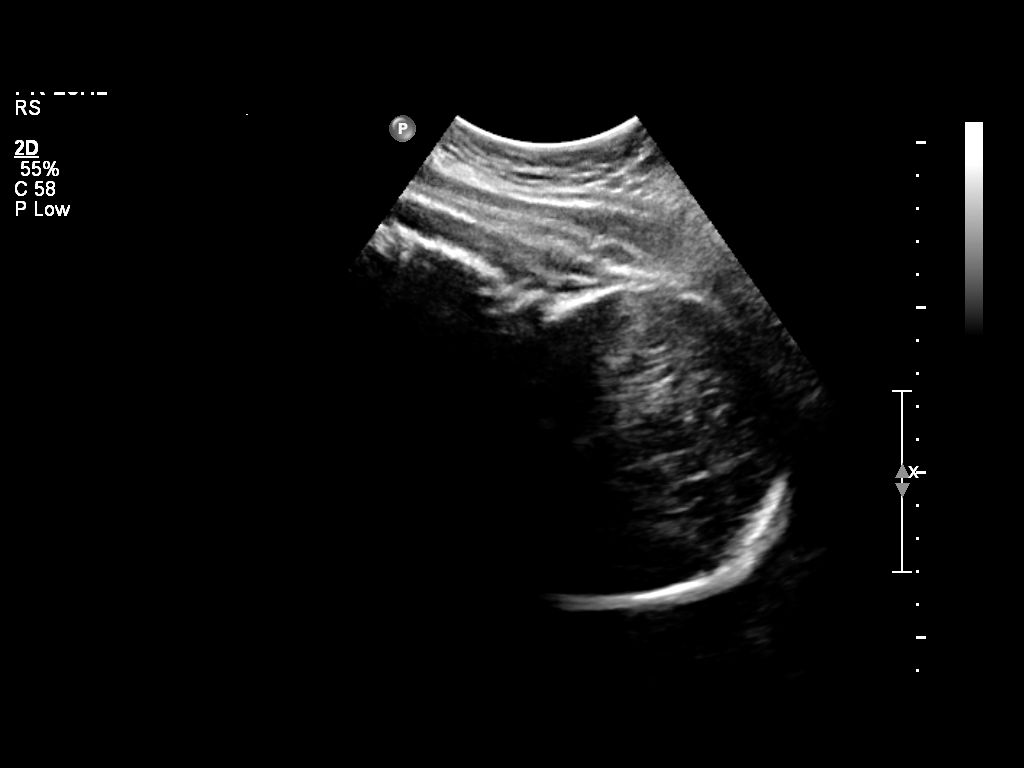
[im 8/9]
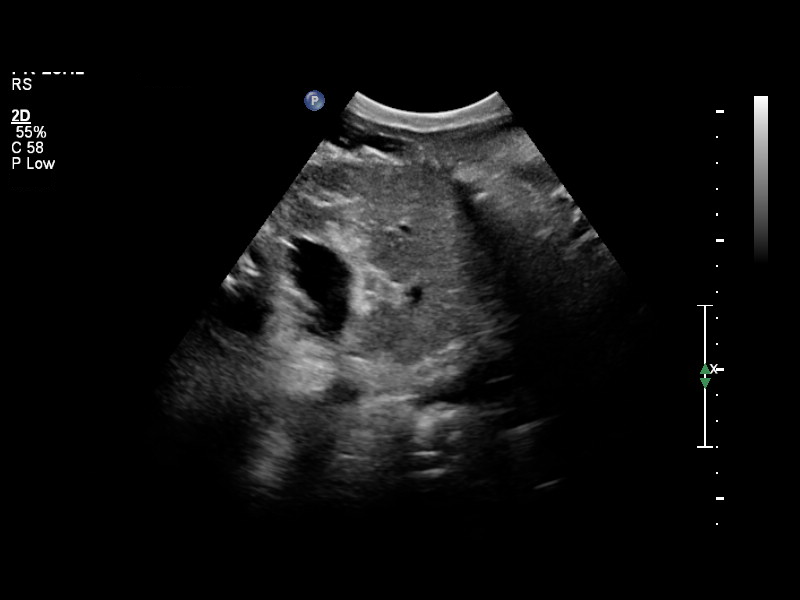
[im 9/9]
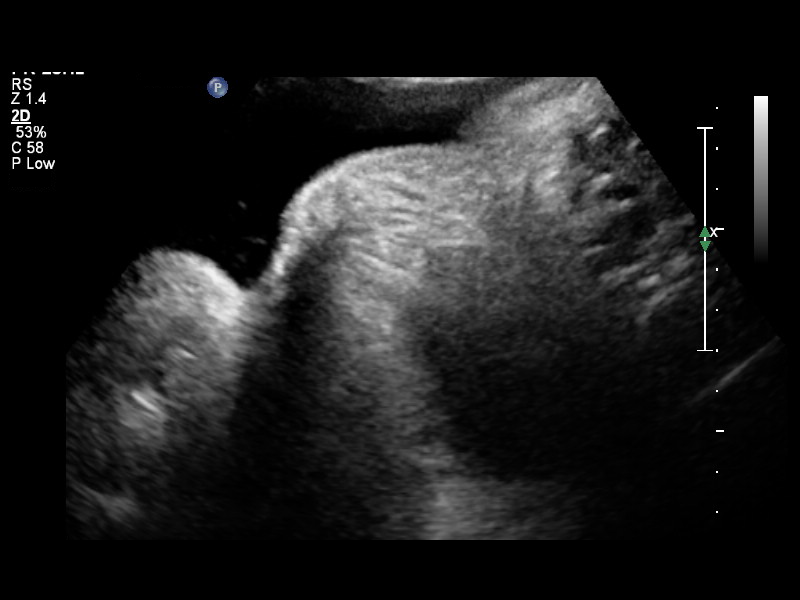

[9 of 9 positions shown; findings below may reference images not displayed]

OBSTETRICS REPORT
                      (Signed Final 07/03/2012 [DATE])

Service(s) Provided

Indications

 Decreased fetal movement
 Abdominal pain - generalized
Fetal Evaluation

 Num Of Fetuses:    1
 Fetal Heart Rate:  143                         bpm
 Cardiac Activity:  Observed
 Presentation:      Cephalic

 Amniotic Fluid
 AFI FV:      Subjectively within normal limits
                                             Larg Pckt:   4.01   cm
Biophysical Evaluation

 Amniotic F.V:   Within normal limits       F. Tone:        Observed
 F. Movement:    Observed                   Score:          [DATE]
 F. Breathing:   Observed
Gestational Age

 LMP:           42w 2d       Date:   09/11/11                 EDD:   06/17/12
 Best:          40w 5d    Det. By:   U/S (01/03/12)           EDD:   06/28/12
Impression

 Single living IUP with assigned GA of 40w 5d in cephalic
 position.
 BPP is [DATE].
 Normal amniotic fluid volume.

 questions or concerns.

## 2013-09-20 ENCOUNTER — Ambulatory Visit (INDEPENDENT_AMBULATORY_CARE_PROVIDER_SITE_OTHER): Payer: Medicaid Other | Admitting: Obstetrics

## 2013-09-20 VITALS — BP 106/68 | Temp 98.3°F | Wt 156.0 lb

## 2013-09-20 DIAGNOSIS — Z3480 Encounter for supervision of other normal pregnancy, unspecified trimester: Secondary | ICD-10-CM

## 2013-09-20 DIAGNOSIS — Z3483 Encounter for supervision of other normal pregnancy, third trimester: Secondary | ICD-10-CM

## 2013-09-20 LAB — POCT URINALYSIS DIPSTICK
Ketones, UA: NEGATIVE
Nitrite, UA: NEGATIVE
Protein, UA: NEGATIVE
Spec Grav, UA: 1.02
Urobilinogen, UA: NEGATIVE

## 2013-09-20 NOTE — Progress Notes (Signed)
Pulse 74 Pt states that she is having some pain and pressure with irregular contractions.

## 2013-09-21 ENCOUNTER — Encounter: Payer: Self-pay | Admitting: Obstetrics

## 2013-09-27 ENCOUNTER — Inpatient Hospital Stay (HOSPITAL_COMMUNITY): Payer: Medicaid Other | Admitting: Anesthesiology

## 2013-09-27 ENCOUNTER — Encounter (HOSPITAL_COMMUNITY): Payer: Medicaid Other | Admitting: Anesthesiology

## 2013-09-27 ENCOUNTER — Inpatient Hospital Stay (HOSPITAL_COMMUNITY)
Admission: AD | Admit: 2013-09-27 | Discharge: 2013-09-29 | DRG: 775 | Disposition: A | Discharge status: Home / Self Care | Payer: Medicaid Other | Source: Ambulatory Visit | Attending: Obstetrics | Admitting: Obstetrics

## 2013-09-27 ENCOUNTER — Encounter: Payer: Medicaid Other | Admitting: Obstetrics

## 2013-09-27 ENCOUNTER — Encounter (HOSPITAL_COMMUNITY): Payer: Self-pay | Admitting: *Deleted

## 2013-09-27 DIAGNOSIS — O99334 Smoking (tobacco) complicating childbirth: Secondary | ICD-10-CM | POA: Diagnosis present

## 2013-09-27 DIAGNOSIS — D649 Anemia, unspecified: Secondary | ICD-10-CM | POA: Diagnosis not present

## 2013-09-27 DIAGNOSIS — O9903 Anemia complicating the puerperium: Principal | ICD-10-CM | POA: Diagnosis not present

## 2013-09-27 LAB — CBC
Hemoglobin: 10.5 g/dL — ABNORMAL LOW (ref 12.0–15.0)
MCH: 29.9 pg (ref 26.0–34.0)
RBC: 3.51 MIL/uL — ABNORMAL LOW (ref 3.87–5.11)
RDW: 13.8 % (ref 11.5–15.5)

## 2013-09-27 LAB — RPR: RPR Ser Ql: NONREACTIVE

## 2013-09-27 MED ORDER — DIPHENHYDRAMINE HCL 25 MG PO CAPS: 25.0000 mg | ORAL_CAPSULE | Freq: Four times a day (QID) | ORAL | Status: DC | PRN

## 2013-09-27 MED ORDER — ZOLPIDEM TARTRATE 5 MG PO TABS: 5.0000 mg | ORAL_TABLET | Freq: Every evening | ORAL | Status: DC | PRN

## 2013-09-27 MED ORDER — ACETAMINOPHEN 325 MG PO TABS: 650.0000 mg | ORAL_TABLET | ORAL | Status: DC | PRN

## 2013-09-27 MED ORDER — OXYCODONE-ACETAMINOPHEN 5-325 MG PO TABS: 1.0000 | ORAL_TABLET | ORAL | Status: DC | PRN

## 2013-09-27 MED ORDER — PHENYLEPHRINE 40 MCG/ML (10ML) SYRINGE FOR IV PUSH (FOR BLOOD PRESSURE SUPPORT)
80.0000 ug | PREFILLED_SYRINGE | INTRAVENOUS | Status: DC | PRN
  Filled 2013-09-27: qty 2

## 2013-09-27 MED ORDER — IBUPROFEN 600 MG PO TABS
600.0000 mg | ORAL_TABLET | Freq: Four times a day (QID) | ORAL | Status: DC
  Administered 2013-09-27 – 2013-09-29 (×8): 600 mg via ORAL
  Filled 2013-09-27 (×9): qty 1

## 2013-09-27 MED ORDER — FLEET ENEMA 7-19 GM/118ML RE ENEM: 1.0000 | ENEMA | RECTAL | Status: DC | PRN

## 2013-09-27 MED ORDER — ONDANSETRON HCL 4 MG/2ML IJ SOLN: 4.0000 mg | Freq: Four times a day (QID) | INTRAMUSCULAR | Status: DC | PRN

## 2013-09-27 MED ORDER — OXYTOCIN BOLUS FROM INFUSION
500.0000 mL | INTRAVENOUS | Status: DC
  Administered 2013-09-27: 500 mL via INTRAVENOUS

## 2013-09-27 MED ORDER — OXYTOCIN 40 UNITS IN LACTATED RINGERS INFUSION - SIMPLE MED
62.5000 mL/h | INTRAVENOUS | Status: DC
  Filled 2013-09-27: qty 1000

## 2013-09-27 MED ORDER — TETANUS-DIPHTH-ACELL PERTUSSIS 5-2.5-18.5 LF-MCG/0.5 IM SUSP: 0.5000 mL | Freq: Once | INTRAMUSCULAR | Status: DC

## 2013-09-27 MED ORDER — OXYTOCIN 40 UNITS IN LACTATED RINGERS INFUSION - SIMPLE MED: 62.5000 mL/h | INTRAVENOUS | Status: DC | PRN

## 2013-09-27 MED ORDER — LACTATED RINGERS IV SOLN
INTRAVENOUS | Status: DC
  Administered 2013-09-27: 06:00:00 via INTRAVENOUS

## 2013-09-27 MED ORDER — EPHEDRINE 5 MG/ML INJ
10.0000 mg | INTRAVENOUS | Status: DC | PRN
  Filled 2013-09-27: qty 4
  Filled 2013-09-27: qty 2

## 2013-09-27 MED ORDER — LIDOCAINE HCL (PF) 1 % IJ SOLN
30.0000 mL | INTRAMUSCULAR | Status: DC | PRN
  Filled 2013-09-27: qty 30

## 2013-09-27 MED ORDER — WITCH HAZEL-GLYCERIN EX PADS: 1.0000 "application " | MEDICATED_PAD | CUTANEOUS | Status: DC | PRN

## 2013-09-27 MED ORDER — PHENYLEPHRINE 40 MCG/ML (10ML) SYRINGE FOR IV PUSH (FOR BLOOD PRESSURE SUPPORT)
80.0000 ug | PREFILLED_SYRINGE | INTRAVENOUS | Status: DC | PRN
  Filled 2013-09-27: qty 10
  Filled 2013-09-27: qty 2

## 2013-09-27 MED ORDER — ONDANSETRON HCL 4 MG/2ML IJ SOLN: 4.0000 mg | INTRAMUSCULAR | Status: DC | PRN

## 2013-09-27 MED ORDER — LACTATED RINGERS IV SOLN: 500.0000 mL | INTRAVENOUS | Status: DC | PRN

## 2013-09-27 MED ORDER — MEDROXYPROGESTERONE ACETATE 150 MG/ML IM SUSP: 150.0000 mg | INTRAMUSCULAR | Status: DC | PRN

## 2013-09-27 MED ORDER — TERBUTALINE SULFATE 1 MG/ML IJ SOLN: 0.2500 mg | Freq: Once | INTRAMUSCULAR | Status: DC | PRN

## 2013-09-27 MED ORDER — PRENATAL MULTIVITAMIN CH
1.0000 | ORAL_TABLET | Freq: Every day | ORAL | Status: DC
  Administered 2013-09-28 – 2013-09-29 (×2): 1 via ORAL
  Filled 2013-09-27 (×2): qty 1

## 2013-09-27 MED ORDER — SODIUM BICARBONATE 8.4 % IV SOLN
INTRAVENOUS | Status: DC | PRN
  Administered 2013-09-27: 5 mL via EPIDURAL

## 2013-09-27 MED ORDER — SIMETHICONE 80 MG PO CHEW: 80.0000 mg | CHEWABLE_TABLET | ORAL | Status: DC | PRN

## 2013-09-27 MED ORDER — CITRIC ACID-SODIUM CITRATE 334-500 MG/5ML PO SOLN: 30.0000 mL | ORAL | Status: DC | PRN

## 2013-09-27 MED ORDER — LANOLIN HYDROUS EX OINT: TOPICAL_OINTMENT | CUTANEOUS | Status: DC | PRN

## 2013-09-27 MED ORDER — FENTANYL 2.5 MCG/ML BUPIVACAINE 1/10 % EPIDURAL INFUSION (WH - ANES)
14.0000 mL/h | INTRAMUSCULAR | Status: DC | PRN
  Administered 2013-09-27: 14 mL/h via EPIDURAL
  Filled 2013-09-27: qty 125

## 2013-09-27 MED ORDER — EPHEDRINE 5 MG/ML INJ
10.0000 mg | INTRAVENOUS | Status: DC | PRN
  Filled 2013-09-27: qty 2

## 2013-09-27 MED ORDER — IBUPROFEN 600 MG PO TABS
600.0000 mg | ORAL_TABLET | Freq: Four times a day (QID) | ORAL | Status: DC | PRN
  Administered 2013-09-27: 600 mg via ORAL
  Filled 2013-09-27: qty 1

## 2013-09-27 MED ORDER — LACTATED RINGERS IV SOLN
500.0000 mL | Freq: Once | INTRAVENOUS | Status: AC
  Administered 2013-09-27: 500 mL via INTRAVENOUS

## 2013-09-27 MED ORDER — ONDANSETRON HCL 4 MG PO TABS: 4.0000 mg | ORAL_TABLET | ORAL | Status: DC | PRN

## 2013-09-27 MED ORDER — BENZOCAINE-MENTHOL 20-0.5 % EX AERO: 1.0000 "application " | INHALATION_SPRAY | CUTANEOUS | Status: DC | PRN

## 2013-09-27 MED ORDER — DIPHENHYDRAMINE HCL 50 MG/ML IJ SOLN: 12.5000 mg | INTRAMUSCULAR | Status: DC | PRN

## 2013-09-27 MED ORDER — DIBUCAINE 1 % RE OINT: 1.0000 "application " | TOPICAL_OINTMENT | RECTAL | Status: DC | PRN

## 2013-09-27 MED ORDER — OXYCODONE-ACETAMINOPHEN 5-325 MG PO TABS
1.0000 | ORAL_TABLET | ORAL | Status: DC | PRN
  Administered 2013-09-27 – 2013-09-28 (×2): 1 via ORAL
  Administered 2013-09-28 – 2013-09-29 (×5): 2 via ORAL
  Filled 2013-09-27: qty 2
  Filled 2013-09-27: qty 1
  Filled 2013-09-27 (×3): qty 2
  Filled 2013-09-27: qty 1
  Filled 2013-09-27: qty 2

## 2013-09-27 MED ORDER — OXYTOCIN 40 UNITS IN LACTATED RINGERS INFUSION - SIMPLE MED
1.0000 m[IU]/min | INTRAVENOUS | Status: DC
  Administered 2013-09-27: 1 m[IU]/min via INTRAVENOUS

## 2013-09-27 MED ORDER — SENNOSIDES-DOCUSATE SODIUM 8.6-50 MG PO TABS
2.0000 | ORAL_TABLET | ORAL | Status: DC
  Administered 2013-09-28 (×2): 2 via ORAL
  Filled 2013-09-27 (×2): qty 2

## 2013-09-27 NOTE — Progress Notes (Signed)
Courtney [name] is a 22 y.o. G3P2002 at [redacted]w[redacted]d by LMP admitted for active labor  Subjective:   Objective: BP 107/69  Pulse 90  Temp(Src) 98.1 F (36.7 C) (Oral)  Resp 18  Ht 5\' 4"  (1.626 m)  Wt 156 lb (70.761 kg)  BMI 26.76 kg/m2  SpO2 99%  LMP 12/23/2012      FHT:  FHR: 140-150 bpm, variability: moderate,  accelerations:  Present,  decelerations:  Absent UC:   regular, every 2-5 minutes SVE:   Dilation: 7.5 Effacement (%): 90 Station: -1 Exam by:: Doreatha Massed, RNC  Labs: Lab Results  Component Value Date   WBC 12.7* 09/27/2013   HGB 10.5* 09/27/2013   HCT 31.2* 09/27/2013   MCV 88.9 09/27/2013   PLT 247 09/27/2013    Assessment / Plan: Spontaneous labor, progressing normally  Labor: Progressing normally Preeclampsia:  n/a Fetal Wellbeing:  Category I Pain Control:  Epidural I/D:  n/a Anticipated MOD:  NSVD  HARPER,CHARLES A 09/27/2013, 7:35 AM

## 2013-09-27 NOTE — Progress Notes (Signed)
Delivery of live viable female 1213 by Wynelle Bourgeois, CNM.

## 2013-09-27 NOTE — Anesthesia Preprocedure Evaluation (Signed)
Anesthesia Evaluation  Patient identified by MRN, date of birth, ID band Patient awake    Reviewed: Allergy & Precautions, H&P , Patient's Chart, lab work & pertinent test results  Airway Mallampati: II TM Distance: >3 FB Neck ROM: full    Dental  (+) Teeth Intact   Pulmonary Current Smoker,  breath sounds clear to auscultation        Cardiovascular Rhythm:regular Rate:Normal     Neuro/Psych    GI/Hepatic   Endo/Other    Renal/GU      Musculoskeletal   Abdominal   Peds  Hematology  (+) anemia ,   Anesthesia Other Findings       Reproductive/Obstetrics (+) Pregnancy                           Anesthesia Physical Anesthesia Plan  ASA: II  Anesthesia Plan: Epidural   Post-op Pain Management:    Induction:   Airway Management Planned:   Additional Equipment:   Intra-op Plan:   Post-operative Plan:   Informed Consent: I have reviewed the patients History and Physical, chart, labs and discussed the procedure including the risks, benefits and alternatives for the proposed anesthesia with the patient or authorized representative who has indicated his/her understanding and acceptance.   Dental Advisory Given  Plan Discussed with:   Anesthesia Plan Comments: (Labs checked- platelets confirmed with RN in room. Fetal heart tracing, per RN, reported to be stable enough for sitting procedure. Discussed epidural, and patient consents to the procedure:  included risk of possible headache,backache, failed block, allergic reaction, and nerve injury. This patient was asked if she had any questions or concerns before the procedure started.)        Anesthesia Quick Evaluation  

## 2013-09-27 NOTE — Lactation Note (Signed)
This note was copied from the chart of Courtney Debbra Digiulio. Lactation Consultation Note  Patient Name: Courtney [name] [id number] Date: 09/27/2013 Reason for consult: Initial assessment;Other (Comment) (charting for exclusion)   Maternal Data Formula Feeding for Exclusion: Yes Reason for exclusion: Mother's choice to formula feed on admision Infant to breast within first hour of birth: No Breastfeeding delayed due to:: Other (comment) (mother does not want to breast feed)  Feeding Feeding Type: Bottle Fed - Formula  LATCH Score/Interventions                      Lactation Tools Discussed/Used     Consult Status Consult Status: Complete    Lynda Rainwater 09/27/2013, 3:35 PM

## 2013-09-27 NOTE — H&P (Signed)
Courtney [name] is a 22 y.o. female presenting for UC's. Maternal Medical History:  Reason for admission: Contractions.  22 yo G3 P2.  EDC 09-29-13.  Presents with UC's.  Fetal activity: Perceived fetal activity is normal.   Last perceived fetal movement was within the past hour.    Prenatal complications: no prenatal complications Prenatal Complications - Diabetes: none.    OB History   Grav Para Term Preterm Abortions TAB SAB Ect Mult Living   3 2 2  0 0 0 0 0 0 2     Past Medical History  Diagnosis Date  . No pertinent past medical history   . Gall stones   . Eczema    Past Surgical History  Procedure Laterality Date  . Gallstones removed     Family History: family history includes Diabetes in her mother. Social History:  reports that she has been smoking Cigarettes.  She has a .5 pack-year smoking history. She has never used smokeless tobacco. She reports that she does not drink alcohol or use illicit drugs.   Prenatal Transfer Tool  Maternal Diabetes: No Genetic Screening: Normal Maternal Ultrasounds/Referrals: Normal Fetal Ultrasounds or other Referrals:  None Maternal Substance Abuse:  No Significant Maternal Medications:  None Significant Maternal Lab Results:  None Other Comments:  None  Review of Systems  All other systems reviewed and are negative.    Dilation: 6 Effacement (%): 80 Station: -1 Exam by:: Judeth Horn RNC Blood pressure 107/69, pulse 83, temperature 98.1 F (36.7 C), temperature source Oral, resp. rate 18, height 5\' 4"  (1.626 m), weight 156 lb (70.761 kg), last menstrual period 12/23/2012. Maternal Exam:  Uterine Assessment: Contraction strength is moderate.  Abdomen: Patient reports no abdominal tenderness. Fetal presentation: vertex  Introitus: Normal vulva. Normal vagina.  Pelvis: adequate for delivery.   Cervix: Cervix evaluated by digital exam.     Physical Exam  Nursing note and vitals reviewed. Constitutional: She is  oriented to person, place, and time. She appears well-developed and well-nourished.  HENT:  Head: Normocephalic and atraumatic.  Eyes: Conjunctivae are normal. Pupils are equal, round, and reactive to light.  Neck: Normal range of motion. Neck supple.  Cardiovascular: Normal rate and regular rhythm.   Respiratory: Effort normal and breath sounds normal.  GI: Soft.  Genitourinary: Vagina normal and uterus normal.  Musculoskeletal: Normal range of motion.  Neurological: She is alert and oriented to person, place, and time.  Skin: Skin is warm and dry.  Psychiatric: She has a normal mood and affect. Her behavior is normal. Judgment and thought content normal.    Prenatal labs: ABO, Rh: O/POS/-- (06/19 1040) Antibody: NEG (06/19 1040) Rubella: 1.49 (06/19 1040) RPR: NON REAC (09/11 1155)  HBsAg: NEGATIVE (06/19 1040)  HIV: NON REACTIVE (09/11 1155)  GBS: NEGATIVE (11/20 1627)   Assessment/Plan: 39.5 weeks.  Active labor.  Admit.   HARPER,CHARLES A 09/27/2013, 7:02 AM

## 2013-09-27 NOTE — Anesthesia Procedure Notes (Addendum)

## 2013-09-28 LAB — CBC
HCT: 26.6 % — ABNORMAL LOW (ref 36.0–46.0)
Hemoglobin: 8.9 g/dL — ABNORMAL LOW (ref 12.0–15.0)
MCV: 88.7 fL (ref 78.0–100.0)
RBC: 3 MIL/uL — ABNORMAL LOW (ref 3.87–5.11)
RDW: 13.8 % (ref 11.5–15.5)
WBC: 13.3 10*3/uL — ABNORMAL HIGH (ref 4.0–10.5)
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
## 2013-09-28 NOTE — Progress Notes (Signed)
UR chart review completed.  

## 2013-09-28 NOTE — Progress Notes (Signed)
Post Partum Day 1 Subjective: no complaints, up ad lib, voiding, tolerating PO and + flatus  Objective: Blood pressure 108/75, pulse 139, temperature 97.2 F (36.2 C), temperature source Oral, resp. rate 18, height 5\' 4"  (1.626 m), weight 156 lb (70.761 kg), last menstrual period 12/23/2012, SpO2 98.00%, unknown if currently breastfeeding.  Physical Exam:  General: alert and cooperative Lochia: appropriate Uterine Fundus: firm Incision: NA DVT Evaluation: No evidence of DVT seen on physical exam.   Recent Labs  09/27/13 0545 09/28/13 0558  HGB 10.5* 8.9*  HCT 31.2* 26.6*    Assessment/Plan: Plan for discharge tomorrow and Contraception Depo @ Discharge Bottle feeding   LOS: 1 day   Rikki Trosper 09/28/2013, 10:42 AM

## 2013-09-28 NOTE — Anesthesia Postprocedure Evaluation (Signed)
Anesthesia Post Note  Patient: Courtney [name]  Procedure(s) Performed: * No procedures listed *  Anesthesia type: Epidural  Patient location: Mother/Baby  Post pain: Pain level controlled  Post assessment: Post-op Vital signs reviewed  Last Vitals:  Filed Vitals:   09/28/13 0317  BP: 103/59  Pulse: 61  Temp: 36.4 C  Resp: 18    Post vital signs: Reviewed  Level of consciousness: awake  Complications: No apparent anesthesia complications

## 2013-09-29 MED ORDER — OXYCODONE-ACETAMINOPHEN 5-325 MG PO TABS: 1.0000 | ORAL_TABLET | ORAL | Status: DC | PRN

## 2013-09-29 MED ORDER — IBUPROFEN 600 MG PO TABS: 600.0000 mg | ORAL_TABLET | Freq: Four times a day (QID) | ORAL | Status: DC | PRN

## 2013-09-29 MED ORDER — FUSION PLUS PO CAPS: 1.0000 | ORAL_CAPSULE | Freq: Every day | ORAL | Status: DC

## 2013-09-29 NOTE — Progress Notes (Addendum)
Post Partum Day 2 Subjective: no complaints  Objective: Blood pressure 110/65, pulse 86, temperature 97.5 F (36.4 C), temperature source Oral, resp. rate 18, height 5\' 4"  (1.626 m), weight 156 lb (70.761 kg), last menstrual period 12/23/2012, SpO2 100.00%, unknown if currently breastfeeding.  Physical Exam:  General: alert and no distress Lochia: appropriate Uterine Fundus: firm Incision: none DVT Evaluation: No evidence of DVT seen on physical exam.   Recent Labs  09/27/13 0545 09/28/13 0558  HGB 10.5* 8.9*  HCT 31.2* 26.6*    Assessment/Plan: Discharge home.  Anemia.  Clinically stable.  Iron Rx.   LOS: 2 days   HARPER,CHARLES A 09/29/2013, 5:55 AM

## 2013-09-29 NOTE — Progress Notes (Signed)
During discharge teaching, patient nodded her head and did not have any questions.   I asked both the patient and her significant other if they had any questions about discharge and they both shook their heads no. Patients older son in the room, crying during teaching.

## 2013-09-29 NOTE — Discharge Summary (Signed)
Obstetric Discharge Summary Reason for Admission: onset of labor Prenatal Procedures: ultrasound Intrapartum Procedures: spontaneous vaginal delivery Postpartum Procedures: none Complications-Operative and Postpartum: none Hemoglobin  Date Value Range Status  09/28/2013 8.9* 12.0 - 15.0 g/dL Final     HCT  Date Value Range Status  09/28/2013 26.6* 36.0 - 46.0 % Final    Physical Exam:  General: alert and no distress Lochia: appropriate Uterine Fundus: firm Incision: none DVT Evaluation: No evidence of DVT seen on physical exam.  Discharge Diagnoses: Term Pregnancy-delivered  Discharge Information: Date: 09/29/2013 Activity: pelvic rest Diet: routine Medications: PNV, Ibuprofen, Colace, Iron and Percocet Condition: stable Instructions: refer to practice specific booklet Discharge to: home   Newborn Data: Live born female  Birth Weight: 7 lb 1.2 oz (3209 g) APGAR: 6, 8  Home with mother.  Courtney [name] A 09/29/2013, 6:01 AM

## 2013-11-21 ENCOUNTER — Encounter: Payer: Self-pay | Admitting: Obstetrics

## 2013-11-21 ENCOUNTER — Ambulatory Visit (INDEPENDENT_AMBULATORY_CARE_PROVIDER_SITE_OTHER): Payer: Medicaid Other | Admitting: Obstetrics

## 2013-11-21 VITALS — BP 117/80 | HR 74 | Temp 98.4°F | Wt 149.0 lb

## 2013-11-21 DIAGNOSIS — Z30017 Encounter for initial prescription of implantable subdermal contraceptive: Secondary | ICD-10-CM

## 2013-11-21 DIAGNOSIS — Z3202 Encounter for pregnancy test, result negative: Secondary | ICD-10-CM

## 2013-11-21 DIAGNOSIS — Z3009 Encounter for other general counseling and advice on contraception: Secondary | ICD-10-CM

## 2013-11-21 DIAGNOSIS — Z309 Encounter for contraceptive management, unspecified: Secondary | ICD-10-CM

## 2013-11-21 LAB — POCT URINE PREGNANCY: PREG TEST UR: NEGATIVE

## 2013-11-21 NOTE — Progress Notes (Signed)
Subjective:     Courtney [name] is a 23 y.o. female who presents for a postpartum visit. She is 8 weeks postpartum following a spontaneous vaginal delivery. I have fully reviewed the prenatal and intrapartum course. The delivery was at 39 gestational weeks. Outcome: spontaneous vaginal delivery. Anesthesia: epidural. Postpartum course has been going well. Baby's course has been going well. Baby is feeding by bottle - Lucien MonsGerber Good Start. Bleeding no bleeding. Bowel function is normal. Bladder function is normal. Patient is not sexually active. Contraception method is none. Postpartum depression screening: negative. Pt would like to discuss birth control and when she can start.  Pt is interested in Nexplanon.  The following portions of the patient's history were reviewed and updated as appropriate: allergies, current medications, past family history, past medical history, past social history, past surgical history and problem list.  Review of Systems Pertinent items are noted in HPI.   Objective:    BP 117/80  Pulse 74  Temp(Src) 98.4 F (36.9 C)  Wt 149 lb (67.586 kg)  LMP 11/05/2013  Breastfeeding? No  General:  alert and no distress Breasts:  Negative for masses or tenderness Abdomen:  Soft, NT. Pelvic:  NEFG.  Uterus NSSC, NT.  Adnexa negative.   Assessment:     Normal postpartum exam. Pap smear not done at today's visit.   Plan:    1. Contraception: Nexplanon 2. Nexplanon Rx. 3. Follow up in: 2 weeks or as needed.

## 2013-11-21 NOTE — Progress Notes (Signed)
Nexplanon Procedure Note   PRE-OP DIAGNOSIS: desired long-term, reversible contraception  POST-OP DIAGNOSIS: Same  PROCEDURE: Nexplanon  placement Performing Provider: Coral Ceoharles Harper MD  Patient education prior to procedure, explained risk, benefits of Nexplanon, reviewed alternative options. Patient reported understanding. Gave consent to continue with procedure.   PROCEDURE:  Pregnancy Text :  Negative Site (check):      left arm         Sterile Preparation:   Betadinex3 Lot # [id number] / [id number] Expiration Date 07 / 2017  Insertion site was selected 8 - 10 cm from medial epicondyle and marked along with guiding site using sterile marker. Procedure area was prepped and draped in a sterile fashion. Nexplanon  was inserted subcutaneously.Needle was removed from the insertion site. Nexplanon capsule was palpated by provider and patient to assure satisfactory placement. Dressing applied.  Followup: The patient tolerated the procedure well without complications.  Standard post-procedure care is explained and return precautions are given.  Coral Ceoharles Harper MD

## 2014-01-01 ENCOUNTER — Emergency Department (INDEPENDENT_AMBULATORY_CARE_PROVIDER_SITE_OTHER)
Admission: EM | Admit: 2014-01-01 | Discharge: 2014-01-01 | Disposition: A | Discharge status: Home / Self Care | Payer: Medicaid Other | Source: Home / Self Care | Attending: Family Medicine | Admitting: Family Medicine

## 2014-01-01 ENCOUNTER — Encounter (HOSPITAL_COMMUNITY): Payer: Self-pay | Admitting: Emergency Medicine

## 2014-01-01 DIAGNOSIS — K219 Gastro-esophageal reflux disease without esophagitis: Secondary | ICD-10-CM

## 2014-01-01 LAB — POCT URINALYSIS DIP (DEVICE)
Bilirubin Urine: NEGATIVE
Glucose, UA: NEGATIVE mg/dL
Hgb urine dipstick: NEGATIVE
Ketones, ur: NEGATIVE mg/dL
LEUKOCYTES UA: NEGATIVE
NITRITE: NEGATIVE
PH: 8.5 — AB (ref 5.0–8.0)
PROTEIN: 30 mg/dL — AB
Specific Gravity, Urine: 1.02 (ref 1.005–1.030)
UROBILINOGEN UA: 0.2 mg/dL (ref 0.0–1.0)

## 2014-01-01 LAB — POCT PREGNANCY, URINE: PREG TEST UR: NEGATIVE

## 2014-01-01 MED ORDER — RANITIDINE HCL 150 MG PO TABS: 150.0000 mg | ORAL_TABLET | Freq: Two times a day (BID) | ORAL | Status: DC

## 2014-01-01 MED ORDER — PANTOPRAZOLE SODIUM 40 MG PO TBEC: 40.0000 mg | DELAYED_RELEASE_TABLET | Freq: Every day | ORAL | Status: DC

## 2014-01-01 MED ORDER — GI COCKTAIL ~~LOC~~
ORAL | Status: AC
  Filled 2014-01-01: qty 30

## 2014-01-01 MED ORDER — GI COCKTAIL ~~LOC~~
30.0000 mL | Freq: Once | ORAL | Status: AC
Start: 2014-01-01 — End: 2014-01-01
  Administered 2014-01-01: 30 mL via ORAL

## 2014-01-01 NOTE — ED Notes (Signed)
Patient complains of stomach pains that started two days ago; States diarrhea alternating with constipation; denies n/v, fever/chills.

## 2014-01-01 NOTE — ED Provider Notes (Addendum)
CSN: [id number]     Arrival date & time 01/01/14  1947 History   First MD Initiated Contact with Patient 01/01/14 2054     Chief Complaint  Patient presents with  . Abdominal Pain   (Consider location/radiation/quality/duration/timing/severity/associated sxs/prior Treatment) Patient is a 23 y.o. female presenting with abdominal pain. The history is provided by the patient and the spouse.  Abdominal Pain Pain location:  Epigastric Pain quality: burning   Pain radiates to:  Does not radiate Pain severity:  Mild Onset quality:  Gradual Duration:  2 days Timing:  Constant Progression:  Unchanged Chronicity:  New Context comment:  Vomited after shot of etoh today, no blood, Worsened by:  NSAIDs and palpation Associated symptoms: belching, constipation and diarrhea   Associated symptoms: no chills, no fever, no hematochezia, no melena, no nausea and no vomiting   Risk factors: NSAID use   Risk factors: not pregnant   Risk factors comment:  Smoking, .lot of soda, uses advil for pain   Past Medical History  Diagnosis Date  . No pertinent past medical history   . Gall stones   . Eczema    Past Surgical History  Procedure Laterality Date  . Gallstones removed     Family History  Problem Relation Age of Onset  . Diabetes Mother    History  Substance Use Topics  . Smoking status: Current Every Day Smoker -- 0.25 packs/day for 2 years    Types: Cigarettes  . Smokeless tobacco: Never Used  . Alcohol Use: No   OB History   Grav Para Term Preterm Abortions TAB SAB Ect Mult Living   3 3 3  0 0 0 0 0 0 3     Review of Systems  Constitutional: Negative.  Negative for fever and chills.  Gastrointestinal: Positive for abdominal pain, diarrhea and constipation. Negative for nausea, vomiting, blood in stool, melena, hematochezia and abdominal distention.  Musculoskeletal: Negative.     Allergies  Review of patient's allergies indicates no known allergies.  Home Medications    Current Outpatient Rx  Name  Route  Sig  Dispense  Refill  . ibuprofen (ADVIL,MOTRIN) 600 MG tablet   Oral   Take 1 tablet (600 mg total) by mouth every 6 (six) hours as needed.   30 tablet   5   . Iron-FA-B Cmp-C-Biot-Probiotic (FUSION PLUS) CAPS   Oral   Take 1 capsule by mouth daily before breakfast.   30 capsule   5   . oxyCODONE-acetaminophen (PERCOCET/ROXICET) 5-325 MG per tablet   Oral   Take 1-2 tablets by mouth every 4 (four) hours as needed for severe pain (moderate - severe pain).   40 tablet   0   . pantoprazole (PROTONIX) 40 MG tablet   Oral   Take 1 tablet (40 mg total) by mouth daily.   30 tablet   1   . Prenatal Vit-Fe Fumarate-FA (PRENATAL MULTIVITAMIN) TABS   Oral   Take 1 tablet by mouth daily at 12 noon.         . ranitidine (ZANTAC) 150 MG tablet   Oral   Take 1 tablet (150 mg total) by mouth 2 (two) times daily.   60 tablet   1    BP 114/74  Pulse 77  Temp(Src) 97.6 F (36.4 C) (Oral)  Resp 16  LMP 12/25/2013 Physical Exam  Nursing note and vitals reviewed. Constitutional: She is oriented to person, place, and time. She appears well-developed and well-nourished. No distress.  HENT:  Mouth/Throat: Oropharynx is clear and moist.  Neck: Normal range of motion. Neck supple.  Cardiovascular: Normal heart sounds.   Pulmonary/Chest: Effort normal and breath sounds normal.  Abdominal: Soft. Normal appearance and bowel sounds are normal. She exhibits no distension and no mass. There is no hepatosplenomegaly. There is tenderness in the epigastric area. There is no rigidity, no rebound, no guarding, no CVA tenderness, no tenderness at McBurney's point and negative Murphy's sign.    Neurological: She is alert and oriented to person, place, and time.  Skin: Skin is warm and dry.    ED Course  Procedures (including critical care time) Labs Review Labs Reviewed  POCT URINALYSIS DIP (DEVICE) - Abnormal; Notable for the following:    pH 8.5  (*)    Protein, ur 30 (*)    All other components within normal limits  POCT PREGNANCY, URINE   Imaging Review No results found.   MDM   1. GERD (gastroesophageal reflux disease)        Linna Hoff, MD 01/01/14 2107  Linna Hoff, MD 01/01/14 2117

## 2014-01-22 ENCOUNTER — Emergency Department (HOSPITAL_COMMUNITY)
Admission: EM | Admit: 2014-01-22 | Discharge: 2014-01-23 | Disposition: A | Discharge status: Home / Self Care | Payer: Medicaid Other | Attending: Emergency Medicine | Admitting: Emergency Medicine

## 2014-01-22 ENCOUNTER — Encounter (HOSPITAL_COMMUNITY): Payer: Self-pay | Admitting: Emergency Medicine

## 2014-01-22 DIAGNOSIS — H9210 Otorrhea, unspecified ear: Secondary | ICD-10-CM | POA: Insufficient documentation

## 2014-01-22 DIAGNOSIS — Z8719 Personal history of other diseases of the digestive system: Secondary | ICD-10-CM | POA: Insufficient documentation

## 2014-01-22 DIAGNOSIS — L539 Erythematous condition, unspecified: Secondary | ICD-10-CM | POA: Insufficient documentation

## 2014-01-22 DIAGNOSIS — H9209 Otalgia, unspecified ear: Secondary | ICD-10-CM | POA: Insufficient documentation

## 2014-01-22 DIAGNOSIS — Z872 Personal history of diseases of the skin and subcutaneous tissue: Secondary | ICD-10-CM | POA: Insufficient documentation

## 2014-01-22 DIAGNOSIS — J3489 Other specified disorders of nose and nasal sinuses: Secondary | ICD-10-CM | POA: Insufficient documentation

## 2014-01-22 DIAGNOSIS — F17200 Nicotine dependence, unspecified, uncomplicated: Secondary | ICD-10-CM | POA: Insufficient documentation
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
## 2014-01-23 ENCOUNTER — Telehealth (HOSPITAL_BASED_OUTPATIENT_CLINIC_OR_DEPARTMENT_OTHER): Payer: Self-pay

## 2014-01-23 MED ORDER — AMOXICILLIN 500 MG PO CAPS: 500.0000 mg | ORAL_CAPSULE | Freq: Three times a day (TID) | ORAL | Status: DC

## 2014-01-23 MED ORDER — CIPROFLOXACIN-HYDROCORTISONE 0.2-1 % OT SUSP: 3.0000 [drp] | Freq: Two times a day (BID) | OTIC | Status: DC

## 2014-01-23 MED ORDER — ANTIPYRINE-BENZOCAINE 5.4-1.4 % OT SOLN
3.0000 [drp] | Freq: Once | OTIC | Status: AC
  Administered 2014-01-23: 3 [drp] via OTIC
  Filled 2014-01-23: qty 10

## 2014-01-23 MED ORDER — ANTIPYRINE-BENZOCAINE 5.4-1.4 % OT SOLN: 3.0000 [drp] | OTIC | Status: DC | PRN

## 2014-01-23 NOTE — Discharge Instructions (Signed)
Draining Ear Ear wax, pus, blood and other fluids are examples of the different types of drainage from ears. Drops or cream may be needed to lessen the itching which may occur with ear drainage. CAUSES   Skin irritations in the ear.  Ear infection.  Swimmer's ear.  Ruptured eardrum.  Foreign object in the ear canal.  Sudden pressure changes.  Head injury. HOME CARE INSTRUCTIONS   Only take over-the-counter or prescription medicines for pain, fever, or discomfort as directed by your caregiver.  Do not rub the ear canal with cotton-tipped swabs.  Do not swim until your caregiver says it is okay.  Before you take a shower, cover a cotton ball with petroleum jelly to keep water out.  Limit exposure to smoke. Secondhand smoke can increase the chance for ear infections.  Keep up with immunizations.  Wash your hands well.  Keep all follow-up appointments to examine the ear and evaluate hearing. SEEK MEDICAL CARE IF:   You have increased drainage.  You have ear pain, a fever, or drainage that is not getting better after 48 hours of antibiotics.  You are unusually tired. SEEK IMMEDIATE MEDICAL CARE IF:  You have severe ear pain or headache.  The patient is older than 3 months with a rectal or oral temperature of 102 F (38.9 C) or higher.  The patient is 113 months old or younger with a rectal temperature of 100.4 F (38 C) or higher.  You vomit.  You feel dizzy.  You have a seizure.  You have new hearing loss. MAKE SURE YOU:   Understand these instructions.  Will watch your condition.  Will get help right away if you are not doing well or get worse. Document Released: 09/27/2005 Document Revised: 12/20/2011 Document Reviewed: 07/31/2009 [facility] Patient Information 2014 [place], [place]. Otitis Externa Otitis externa is a bacterial or fungal infection of the outer ear canal. This is the area from the eardrum to the outside of the ear. Otitis externa is  sometimes called "swimmer's ear." CAUSES  Possible causes of infection include:  Swimming in dirty water.  Moisture remaining in the ear after swimming or bathing.  Mild injury (trauma) to the ear.  Objects stuck in the ear (foreign body).  Cuts or scrapes (abrasions) on the outside of the ear. SYMPTOMS  The first symptom of infection is often itching in the ear canal. Later signs and symptoms may include swelling and redness of the ear canal, ear pain, and yellowish-white fluid (pus) coming from the ear. The ear pain may be worse when pulling on the earlobe. DIAGNOSIS  Your caregiver will perform a physical exam. A sample of fluid may be taken from the ear and examined for bacteria or fungi. TREATMENT  Antibiotic ear drops are often given for 10 to 14 days. Treatment may also include pain medicine or corticosteroids to reduce itching and swelling. PREVENTION   Keep your ear dry. Use the corner of a towel to absorb water out of the ear canal after swimming or bathing.  Avoid scratching or putting objects inside your ear. This can damage the ear canal or remove the protective wax that lines the canal. This makes it easier for bacteria and fungi to grow.  Avoid swimming in lakes, polluted water, or poorly chlorinated pools.  You may use ear drops made of rubbing alcohol and vinegar after swimming. Combine equal parts of white vinegar and alcohol in a bottle. Put 3 or 4 drops into each ear after swimming. HOME CARE  INSTRUCTIONS   Apply antibiotic ear drops to the ear canal as prescribed by your caregiver.  Only take over-the-counter or prescription medicines for pain, discomfort, or fever as directed by your caregiver.  If you have diabetes, follow any additional treatment instructions from your caregiver.  Keep all follow-up appointments as directed by your caregiver. SEEK MEDICAL CARE IF:   You have a fever.  Your ear is still red, swollen, painful, or draining pus after 3  days.  Your redness, swelling, or pain gets worse.  You have a severe headache.  You have redness, swelling, pain, or tenderness in the area behind your ear. MAKE SURE YOU:   Understand these instructions.  Will watch your condition.  Will get help right away if you are not doing well or get worse. Document Released: 09/27/2005 Document Revised: 12/20/2011 Document Reviewed: 10/14/2011 [facility] Patient Information 2014 [place], [place].

## 2014-01-23 NOTE — ED Provider Notes (Signed)
Per telephone conversation with pharmacy the prescription is not covered by insurance, requesting change. ciprofloxacin-hydrocortisone (CIPRO HC) otic suspension Place 3 drops into the left ear 2 (two) times daily. 10 mL Roxy Horsemanobert Browning, PA-C  Rx: Ciprodex   4 drops into Left ear BID for 7 days.   Clabe SealLauren M Quincy Prisco, PA-C 01/23/14 [phone]

## 2014-01-23 NOTE — ED Provider Notes (Signed)
Medical screening examination/treatment/procedure(s) were performed by non-physician practitioner and as supervising physician I was immediately available for consultation/collaboration.    Vida RollerBrian D Davianna Deutschman, MD 01/23/14 [phone]

## 2014-01-23 NOTE — ED Notes (Signed)
Discharge instruction given to pt by EME RN.

## 2014-01-23 NOTE — ED Provider Notes (Signed)
Medical screening examination/treatment/procedure(s) were performed by non-physician practitioner and as supervising physician I was immediately available for consultation/collaboration.   EKG Interpretation None        Gwyneth SproutWhitney Chandlar Guice, MD 01/23/14 2345

## 2014-01-23 NOTE — ED Provider Notes (Signed)
CSN: [id number]     Arrival date & time 01/22/14  2346 History   First MD Initiated Contact with Patient 01/22/14 2354     Chief Complaint  Patient presents with  . Otalgia     (Consider location/radiation/quality/duration/timing/severity/associated sxs/prior Treatment) HPI Comments: Patient presents emergency department with chief complaint of left ear pain. She states that she has had an earache, with drainage x2 days. She denies any fevers or chills. Has not tried taking anything to alleviate her symptoms. She states that she has tried cleaning her ear with peroxide with no relief. She denies history of diabetes. She is not breast-feeding. She has not taken any medication on a daily basis.  The history is provided by the patient. No language interpreter was used.    Past Medical History  Diagnosis Date  . No pertinent past medical history   . Gall stones   . Eczema    Past Surgical History  Procedure Laterality Date  . Gallstones removed     Family History  Problem Relation Age of Onset  . Diabetes Mother    History  Substance Use Topics  . Smoking status: Current Every Day Smoker -- 0.25 packs/day for 2 years    Types: Cigarettes  . Smokeless tobacco: Never Used  . Alcohol Use: No   OB History   Grav Para Term Preterm Abortions TAB SAB Ect Mult Living   3 3 3  0 0 0 0 0 0 3     Review of Systems  Constitutional: Negative for fever and chills.  HENT: Positive for ear discharge, ear pain and rhinorrhea.   Respiratory: Negative for cough, shortness of breath and wheezing.   Cardiovascular: Negative for chest pain.  Gastrointestinal: Negative for nausea, vomiting, diarrhea and constipation.      Allergies  Review of patient's allergies indicates no known allergies.  Home Medications   Prior to Admission medications   Not on File   BP 120/72  Pulse 79  Temp(Src) 99.1 F (37.3 C) (Oral)  Resp 18  Ht 5\' 4"  (1.626 m)  Wt 153 lb (69.4 kg)  BMI 26.25 kg/m2   SpO2 98%  LMP 12/25/2013 Physical Exam  Nursing note and vitals reviewed. Constitutional: She is oriented to person, place, and time. She appears well-developed and well-nourished.  HENT:  Head: Normocephalic and atraumatic.  Left ear canal is erythematous, and with mild discharge, TM is remarkable for congestion, and some erythema, no mastoid tenderness  Eyes: Conjunctivae and EOM are normal.  Neck: Normal range of motion.  Cardiovascular: Normal rate.   Pulmonary/Chest: Effort normal.  Abdominal: She exhibits no distension.  Musculoskeletal: Normal range of motion.  Neurological: She is alert and oriented to person, place, and time.  Skin: Skin is dry.  Psychiatric: She has a normal mood and affect. Her behavior is normal. Judgment and thought content normal.    ED Course  Procedures (including critical care time) Labs Review Labs Reviewed - No data to display  Imaging Review No results found.   EKG Interpretation None      MDM   Final diagnoses:  Ear ache    Patient with probable otitis. Will treat with drops and oral medications. Will give auralgan in the ED.  Will reassess.  1:15 AM Improved with auralgan.     Roxy Horsemanobert Noboru Bidinger, PA-C 01/23/14 [phone]

## 2014-01-23 NOTE — Telephone Encounter (Signed)
Pharmacy calling pt prescribed Cipro HC and its not covered by Medicaid.  Call transferred to L. Parker PA for change.

## 2014-02-20 ENCOUNTER — Encounter: Payer: Self-pay | Admitting: Obstetrics

## 2014-02-20 ENCOUNTER — Ambulatory Visit: Payer: Medicaid Other | Admitting: Obstetrics

## 2014-02-20 ENCOUNTER — Ambulatory Visit (INDEPENDENT_AMBULATORY_CARE_PROVIDER_SITE_OTHER): Payer: Medicaid Other | Admitting: Obstetrics

## 2014-02-20 VITALS — BP 98/67 | HR 94 | Temp 98.7°F | Ht 64.0 in | Wt 153.0 lb

## 2014-02-20 DIAGNOSIS — L259 Unspecified contact dermatitis, unspecified cause: Secondary | ICD-10-CM

## 2014-02-20 DIAGNOSIS — L309 Dermatitis, unspecified: Secondary | ICD-10-CM

## 2014-02-20 DIAGNOSIS — Z3046 Encounter for surveillance of implantable subdermal contraceptive: Secondary | ICD-10-CM

## 2014-02-20 DIAGNOSIS — B360 Pityriasis versicolor: Secondary | ICD-10-CM

## 2014-02-20 MED ORDER — CLOTRIMAZOLE-BETAMETHASONE 1-0.05 % EX CREA: 1.0000 "application " | TOPICAL_CREAM | Freq: Two times a day (BID) | CUTANEOUS | Status: DC

## 2014-02-20 MED ORDER — FLUOCINONIDE 0.05 % EX CREA: 1.0000 "application " | TOPICAL_CREAM | Freq: Two times a day (BID) | CUTANEOUS | Status: DC

## 2014-02-21 ENCOUNTER — Encounter: Payer: Self-pay | Admitting: Obstetrics

## 2014-02-21 NOTE — Progress Notes (Signed)
Subjective:    Courtney [name] is a 23 y.o. female who presents for contraception counseling. The patient has no complaints today. The patient is sexually active. Pertinent past medical history: current smoker.  The information documented in the HPI was reviewed and verified.  Menstrual History: OB History   Grav Para Term Preterm Abortions TAB SAB Ect Mult Living   3 3 3  0 0 0 0 0 0 3       Patient's last menstrual period was 02/19/2014.   Patient Active Problem List   Diagnosis Date Noted  . Indication for care in labor or delivery 09/27/2013  . Normal delivery 09/27/2013  . Right orbit fracture 08/10/2013  . Eczema 04/17/2013  . Mild hyperemesis gravidarum, antepartum 02/19/2013  . General counseling for initiation of other contraceptive measures 01/02/2013  . Anemia, iron deficiency 05/30/2012   Past Medical History  Diagnosis Date  . No pertinent past medical history   . Gall stones   . Eczema     Past Surgical History  Procedure Laterality Date  . Gallstones removed      Current outpatient prescriptions:clotrimazole-betamethasone (LOTRISONE) cream, Apply 1 application topically 2 (two) times daily., Disp: 45 g, Rfl: 2;  fluocinonide cream (LIDEX) 0.05 %, Apply 1 application topically 2 (two) times daily., Disp: 120 g, Rfl: 2 No Known Allergies  History  Substance Use Topics  . Smoking status: Current Every Day Smoker -- 0.25 packs/day for 2 years    Types: Cigarettes  . Smokeless tobacco: Never Used  . Alcohol Use: No    Family History  Problem Relation Age of Onset  . Diabetes Mother        Review of Systems Constitutional: negative for weight loss Genitourinary:negative for abnormal menstrual periods and vaginal discharge   Objective:   BP 98/67  Pulse 94  Temp(Src) 98.7 F (37.1 C)  Ht 5\' 4"  (1.626 m)  Wt 153 lb (69.4 kg)  BMI 26.25 kg/m2  LMP 02/19/2014  Breastfeeding? No   PE:  Deferred                      Lab Review Urine  pregnancy test Labs reviewed yes Radiologic studies reviewed no  100% of 10 min visit spent on counseling and coordination of care.   Assessment:    23 y.o., continuing Nexplanon, no contraindications.   Eczema  Tinea Versicolor  Plan:    All questions answered. Contraception: Nexplanon. Follow up as needed.  Meds ordered this encounter  Medications  . fluocinonide cream (LIDEX) 0.05 %    Sig: Apply 1 application topically 2 (two) times daily.    Dispense:  120 g    Refill:  2  . clotrimazole-betamethasone (LOTRISONE) cream    Sig: Apply 1 application topically 2 (two) times daily.    Dispense:  45 g    Refill:  2   No orders of the defined types were placed in this encounter.   Need to obtain previous records Follow up as needed.

## 2014-05-28 ENCOUNTER — Emergency Department (HOSPITAL_COMMUNITY)
Admission: EM | Admit: 2014-05-28 | Discharge: 2014-05-28 | Disposition: A | Discharge status: Home / Self Care | Payer: Medicaid Other | Source: Home / Self Care

## 2014-05-28 ENCOUNTER — Encounter (HOSPITAL_COMMUNITY): Payer: Self-pay | Admitting: Emergency Medicine

## 2014-05-28 DIAGNOSIS — F17200 Nicotine dependence, unspecified, uncomplicated: Secondary | ICD-10-CM

## 2014-05-28 DIAGNOSIS — J45909 Unspecified asthma, uncomplicated: Secondary | ICD-10-CM

## 2014-05-28 DIAGNOSIS — J3089 Other allergic rhinitis: Secondary | ICD-10-CM

## 2014-05-28 DIAGNOSIS — Z720 Tobacco use: Secondary | ICD-10-CM

## 2014-05-28 DIAGNOSIS — J4520 Mild intermittent asthma, uncomplicated: Secondary | ICD-10-CM

## 2014-05-28 MED ORDER — ALBUTEROL SULFATE HFA 108 (90 BASE) MCG/ACT IN AERS: 2.0000 | INHALATION_SPRAY | RESPIRATORY_TRACT | Status: DC | PRN

## 2014-05-28 MED ORDER — PREDNISONE 20 MG PO TABS: ORAL_TABLET | ORAL | Status: DC

## 2014-05-28 MED ORDER — ALBUTEROL SULFATE (2.5 MG/3ML) 0.083% IN NEBU
INHALATION_SOLUTION | RESPIRATORY_TRACT | Status: AC
  Filled 2014-05-28: qty 3

## 2014-05-28 MED ORDER — IPRATROPIUM BROMIDE 0.02 % IN SOLN
RESPIRATORY_TRACT | Status: AC
  Filled 2014-05-28: qty 2.5

## 2014-05-28 MED ORDER — IPRATROPIUM-ALBUTEROL 0.5-2.5 (3) MG/3ML IN SOLN
3.0000 mL | Freq: Once | RESPIRATORY_TRACT | Status: AC
  Administered 2014-05-28: 3 mL via RESPIRATORY_TRACT

## 2014-05-28 MED ORDER — ALBUTEROL SULFATE (2.5 MG/3ML) 0.083% IN NEBU
2.5000 mg | INHALATION_SOLUTION | Freq: Once | RESPIRATORY_TRACT | Status: AC
  Administered 2014-05-28: 2.5 mg via RESPIRATORY_TRACT

## 2014-05-28 NOTE — Discharge Instructions (Signed)
Allergic Rhinitis flonase nasal spray Allegra or claritin daily Sudafed PE for nasal congestion as needed Robitussin Dm for cough Albuterol inhaler for cough and wheeze Allergic rhinitis is when the mucous membranes in the nose respond to allergens. Allergens are particles in the air that cause your body to have an allergic reaction. This causes you to release allergic antibodies. Through a chain of events, these eventually cause you to release histamine into the blood stream. Although meant to protect the body, it is this release of histamine that causes your discomfort, such as frequent sneezing, congestion, and an itchy, runny nose.  CAUSES  Seasonal allergic rhinitis (hay fever) is caused by pollen allergens that may come from grasses, trees, and weeds. Year-round allergic rhinitis (perennial allergic rhinitis) is caused by allergens such as house dust mites, pet dander, and mold spores.  SYMPTOMS   Nasal stuffiness (congestion).  Itchy, runny nose with sneezing and tearing of the eyes. DIAGNOSIS  Your health care provider can help you determine the allergen or allergens that trigger your symptoms. If you and your health care provider are unable to determine the allergen, skin or blood testing may be used. TREATMENT  Allergic rhinitis does not have a cure, but it can be controlled by:  Medicines and allergy shots (immunotherapy).  Avoiding the allergen. Hay fever may often be treated with antihistamines in pill or nasal spray forms. Antihistamines block the effects of histamine. There are over-the-counter medicines that may help with nasal congestion and swelling around the eyes. Check with your health care provider before taking or giving this medicine.  If avoiding the allergen or the medicine prescribed do not work, there are many new medicines your health care provider can prescribe. Stronger medicine may be used if initial measures are ineffective. Desensitizing injections can be used  if medicine and avoidance does not work. Desensitization is when a patient is given ongoing shots until the body becomes less sensitive to the allergen. Make sure you follow up with your health care provider if problems continue. HOME CARE INSTRUCTIONS It is not possible to completely avoid allergens, but you can reduce your symptoms by taking steps to limit your exposure to them. It helps to know exactly what you are allergic to so that you can avoid your specific triggers. SEEK MEDICAL CARE IF:   You have a fever.  You develop a cough that does not stop easily (persistent).  You have shortness of breath.  You start wheezing.  Symptoms interfere with normal daily activities. Document Released: 06/22/2001 Document Revised: 10/02/2013 Document Reviewed: 06/04/2013 [facility] Patient Information 2015 [place], [place]. This information is not intended to replace advice given to you by your health care provider. Make sure you discuss any questions you have with your health care provider.  Bronchospasm A bronchospasm is a spasm or tightening of the airways going into the lungs. During a bronchospasm breathing becomes more difficult because the airways get smaller. When this happens there can be coughing, a whistling sound when breathing (wheezing), and difficulty breathing. Bronchospasm is often associated with asthma, but not all patients who experience a bronchospasm have asthma. CAUSES  A bronchospasm is caused by inflammation or irritation of the airways. The inflammation or irritation may be triggered by:   Allergies (such as to animals, pollen, food, or mold). Allergens that cause bronchospasm may cause wheezing immediately after exposure or many hours later.   Infection. Viral infections are believed to be the most common cause of bronchospasm.   Exercise.  Irritants (such as pollution, cigarette smoke, strong odors, aerosol sprays, and paint fumes).   Weather changes. Winds  increase molds and pollens in the air. Rain refreshes the air by washing irritants out. Cold air may cause inflammation.   Stress and emotional upset.  SIGNS AND SYMPTOMS   Wheezing.   Excessive nighttime coughing.   Frequent or severe coughing with a simple cold.   Chest tightness.   Shortness of breath.  DIAGNOSIS  Bronchospasm is usually diagnosed through a history and physical exam. Tests, such as chest X-rays, are sometimes done to look for other conditions. TREATMENT   Inhaled medicines can be given to open up your airways and help you breathe. The medicines can be given using either an inhaler or a nebulizer machine.  Corticosteroid medicines may be given for severe bronchospasm, usually when it is associated with asthma. HOME CARE INSTRUCTIONS   Always have a plan prepared for seeking medical care. Know when to call your health care provider and local emergency services (911 in the [place] Know where you can access local emergency care.  Only take medicines as directed by your health care provider.  If you were prescribed an inhaler or nebulizer machine, ask your health care provider to explain how to use it correctly. Always use a spacer with your inhaler if you were given one.  It is necessary to remain calm during an attack. Try to relax and breathe more slowly.  Control your home environment in the following ways:   Change your heating and air conditioning filter at least once a month.   Limit your use of fireplaces and wood stoves.  Do not smoke and do not allow smoking in your home.   Avoid exposure to perfumes and fragrances.   Get rid of pests (such as roaches and mice) and their droppings.   Throw away plants if you see mold on them.   Keep your house clean and dust free.   Replace carpet with wood, tile, or vinyl flooring. Carpet can trap dander and dust.   Use allergy-proof pillows, mattress covers, and box spring covers.   Wash  bed sheets and blankets every week in hot water and dry them in a dryer.   Use blankets that are made of polyester or cotton.   Wash hands frequently. SEEK MEDICAL CARE IF:   You have muscle aches.   You have chest pain.   The sputum changes from clear or white to yellow, green, gray, or bloody.   The sputum you cough up gets thicker.   There are problems that may be related to the medicine you are given, such as a rash, itching, swelling, or trouble breathing.  SEEK IMMEDIATE MEDICAL CARE IF:   You have worsening wheezing and coughing even after taking your prescribed medicines.   You have increased difficulty breathing.   You develop severe chest pain. MAKE SURE YOU:   Understand these instructions.  Will watch your condition.  Will get help right away if you are not doing well or get worse. Document Released: 09/30/2003 Document Revised: 10/02/2013 Document Reviewed: 03/19/2013 [facility] Patient Information 2015 [place], [place]. This information is not intended to replace advice given to you by your health care provider. Make sure you discuss any questions you have with your health care provider.  How to Use an Inhaler Using your inhaler correctly is very important. Good technique will make sure that the medicine reaches your lungs.  HOW TO USE AN INHALER: 1. Take the  cap off the inhaler. 2. If this is the first time using your inhaler, you need to prime it. Shake the inhaler for 5 seconds. Release four puffs into the air, away from your face. Ask your doctor for help if you have questions. 3. Shake the inhaler for 5 seconds. 4. Turn the inhaler so the bottle is above the mouthpiece. 5. Put your pointer finger on top of the bottle. Your thumb holds the bottom of the inhaler. 6. Open your mouth. 7. Either hold the inhaler away from your mouth (the width of 2 fingers) or place your lips tightly around the mouthpiece. Ask your doctor which way to use your  inhaler. 8. Breathe out as much air as possible. 9. Breathe in and push down on the bottle 1 time to release the medicine. You will feel the medicine go in your mouth and throat. 10. Continue to take a deep breath in very slowly. Try to fill your lungs. 11. After you have breathed in completely, hold your breath for 10 seconds. This will help the medicine to settle in your lungs. If you cannot hold your breath for 10 seconds, hold it for as long as you can before you breathe out. 12. Breathe out slowly, through pursed lips. Whistling is an example of pursed lips. 13. If your doctor has told you to take more than 1 puff, wait at least 15-30 seconds between puffs. This will help you get the best results from your medicine. Do not use the inhaler more than your doctor tells you to. 14. Put the cap back on the inhaler. 15. Follow the directions from your doctor or from the inhaler package about cleaning the inhaler. If you use more than one inhaler, ask your doctor which inhalers to use and what order to use them in. Ask your doctor to help you figure out when you will need to refill your inhaler.  If you use a steroid inhaler, always rinse your mouth with water after your last puff, gargle and spit out the water. Do not swallow the water. GET HELP IF:  The inhaler medicine only partially helps to stop wheezing or shortness of breath.  You are having trouble using your inhaler.  You have some increase in thick spit (phlegm). GET HELP RIGHT AWAY IF:  The inhaler medicine does not help your wheezing or shortness of breath or you have tightness in your chest.  You have dizziness, headaches, or fast heart rate.  You have chills, fever, or night sweats.  You have a large increase of thick spit, or your thick spit is bloody. MAKE SURE YOU:   Understand these instructions.  Will watch your condition.  Will get help right away if you are not doing well or get worse. Document Released:  07/06/2008 Document Revised: 07/18/2013 Document Reviewed: 04/26/2013 [facility] Patient Information 2015 [place], [place]. This information is not intended to replace advice given to you by your health care provider. Make sure you discuss any questions you have with your health care provider.  Smoking Cessation Quitting smoking is important to your health and has many advantages. However, it is not always easy to quit since nicotine is a very addictive drug. Oftentimes, people try 3 times or more before being able to quit. This document explains the best ways for you to prepare to quit smoking. Quitting takes hard work and a lot of effort, but you can do it. ADVANTAGES OF QUITTING SMOKING  You will live longer, feel better, and live  better.  Your body will feel the impact of quitting smoking almost immediately.  Within 20 minutes, blood pressure decreases. Your pulse returns to its normal level.  After 8 hours, carbon monoxide levels in the blood return to normal. Your oxygen level increases.  After 24 hours, the chance of having a heart attack starts to decrease. Your breath, hair, and body stop smelling like smoke.  After 48 hours, damaged nerve endings begin to recover. Your sense of taste and smell improve.  After 72 hours, the body is virtually free of nicotine. Your bronchial tubes relax and breathing becomes easier.  After 2 to 12 weeks, lungs can hold more air. Exercise becomes easier and circulation improves.  The risk of having a heart attack, stroke, cancer, or lung disease is greatly reduced.  After 1 year, the risk of coronary heart disease is cut in half.  After 5 years, the risk of stroke falls to the same as a nonsmoker.  After 10 years, the risk of lung cancer is cut in half and the risk of other cancers decreases significantly.  After 15 years, the risk of coronary heart disease drops, usually to the level of a nonsmoker.  If you are pregnant, quitting smoking will  improve your chances of having a healthy baby.  The people you live with, especially any children, will be healthier.  You will have extra money to spend on things other than cigarettes. QUESTIONS TO THINK ABOUT BEFORE ATTEMPTING TO QUIT You may want to talk about your answers with your health care provider.  Why do you want to quit?  If you tried to quit in the past, what helped and what did not?  What will be the most difficult situations for you after you quit? How will you plan to handle them?  Who can help you through the tough times? Your family? Friends? A health care provider?  What pleasures do you get from smoking? What ways can you still get pleasure if you quit? Here are some questions to ask your health care provider:  How can you help me to be successful at quitting?  What medicine do you think would be best for me and how should I take it?  What should I do if I need more help?  What is smoking withdrawal like? How can I get information on withdrawal? GET READY  Set a quit date.  Change your environment by getting rid of all cigarettes, ashtrays, matches, and lighters in your home, car, or work. Do not let people smoke in your home.  Review your past attempts to quit. Think about what worked and what did not. GET SUPPORT AND ENCOURAGEMENT You have a better chance of being successful if you have help. You can get support in many ways.  Tell your family, friends, and coworkers that you are going to quit and need their support. Ask them not to smoke around you.  Get individual, group, or telephone counseling and support. Programs are available at [facility]. Call your local health department for information about programs in your area.  Spiritual beliefs and practices may help some smokers quit.  Download a "quit meter" on your computer to keep track of quit statistics, such as how long you have gone without smoking, cigarettes not smoked,  and money saved.  Get a self-help book about quitting smoking and staying off tobacco. LEARN NEW SKILLS AND BEHAVIORS  Distract yourself from urges to smoke. Talk to someone, go for  a walk, or occupy your time with a task.  Change your normal routine. Take a different route to work. Drink tea instead of coffee. Eat breakfast in a different place.  Reduce your stress. Take a hot bath, exercise, or read a book.  Plan something enjoyable to do every day. Reward yourself for not smoking.  Explore interactive web-based programs that specialize in helping you quit. GET MEDICINE AND USE IT CORRECTLY Medicines can help you stop smoking and decrease the urge to smoke. Combining medicine with the above behavioral methods and support can greatly increase your chances of successfully quitting smoking.  Nicotine replacement therapy helps deliver nicotine to your body without the negative effects and risks of smoking. Nicotine replacement therapy includes nicotine gum, lozenges, inhalers, nasal sprays, and skin patches. Some may be available over-the-counter and others require a prescription.  Antidepressant medicine helps people abstain from smoking, but how this works is unknown. This medicine is available by prescription.  Nicotinic receptor partial agonist medicine simulates the effect of nicotine in your brain. This medicine is available by prescription. Ask your health care provider for advice about which medicines to use and how to use them based on your health history. Your health care provider will tell you what side effects to look out for if you choose to be on a medicine or therapy. Carefully read the information on the package. Do not use any other product containing nicotine while using a nicotine replacement product.  RELAPSE OR DIFFICULT SITUATIONS Most relapses occur within the first 3 months after quitting. Do not be discouraged if you start smoking again. Remember, most people try  several times before finally quitting. You may have symptoms of withdrawal because your body is used to nicotine. You may crave cigarettes, be irritable, feel very hungry, cough often, get headaches, or have difficulty concentrating. The withdrawal symptoms are only temporary. They are strongest when you first quit, but they will go away within 10-14 days. To reduce the chances of relapse, try to:  Avoid drinking alcohol. Drinking lowers your chances of successfully quitting.  Reduce the amount of caffeine you consume. Once you quit smoking, the amount of caffeine in your body increases and can give you symptoms, such as a rapid heartbeat, sweating, and anxiety.  Avoid smokers because they can make you want to smoke.  Do not let weight gain distract you. Many smokers will gain weight when they quit, usually less than 10 pounds. Eat a healthy diet and stay active. You can always lose the weight gained after you quit.  Find ways to improve your mood other than smoking. FOR MORE INFORMATION  www.smokefree.gov  Document Released: 09/21/2001 Document Revised: 02/11/2014 Document Reviewed: 01/06/2012 [facility] Patient Information 2015 [place], [place]. This information is not intended to replace advice given to you by your health care provider. Make sure you discuss any questions you have with your health care provider.

## 2014-05-28 NOTE — ED Provider Notes (Signed)
Medical screening examination/treatment/procedure(s) were performed by resident physician or non-physician practitioner and as supervising physician I was immediately available for consultation/collaboration.   Barkley BrunsKINDL,Cleatus Gabriel DOUGLAS MD.   Linna HoffJames D Dagmawi Venable, MD 05/28/14 1630

## 2014-05-28 NOTE — ED Provider Notes (Signed)
CSN: [id number]     Arrival date & time 05/28/14  0909 History   First MD Initiated Contact with Patient 05/28/14 [phone]     Chief Complaint  Patient presents with  . Cough   (Consider location/radiation/quality/duration/timing/severity/associated sxs/prior Treatment) HPI Comments: 23 year old female presents with a complaint of coughing with copious amount of mucus since last PM. She denies sore throat, fever or shortness of breath. She complains of upper respiratory congestion but denies drainage because she does not know what that is. She smokes cigarettes on a daily basis for 2 years.   Past Medical History  Diagnosis Date  . No pertinent past medical history   . Gall stones   . Eczema    Past Surgical History  Procedure Laterality Date  . Gallstones removed     Family History  Problem Relation Age of Onset  . Diabetes Mother    History  Substance Use Topics  . Smoking status: Current Every Day Smoker -- 0.25 packs/day for 2 years    Types: Cigarettes  . Smokeless tobacco: Never Used  . Alcohol Use: No   OB History   Grav Para Term Preterm Abortions TAB SAB Ect Mult Living   3 3 3  0 0 0 0 0 0 3     Review of Systems  Constitutional: Negative.   HENT: Positive for congestion. Negative for ear pain, hearing loss, postnasal drip and sore throat.   Eyes: Negative.   Respiratory: Positive for cough. Negative for chest tightness, shortness of breath and wheezing.   Cardiovascular: Negative for chest pain and leg swelling.  Gastrointestinal: Negative.   Genitourinary: Negative.     Allergies  Review of patient's allergies indicates no known allergies.  Home Medications   Prior to Admission medications   Medication Sig Start Date End Date Taking? Authorizing Provider  albuterol (PROVENTIL HFA;VENTOLIN HFA) 108 (90 BASE) MCG/ACT inhaler Inhale 2 puffs into the lungs every 4 (four) hours as needed for wheezing or shortness of breath. 05/28/14   Hayden Rasmussenavid Daegan Arizmendi, NP   clotrimazole-betamethasone (LOTRISONE) cream Apply 1 application topically 2 (two) times daily. 02/20/14   Brock Badharles A Harper, MD  fluocinonide cream (LIDEX) 0.05 % Apply 1 application topically 2 (two) times daily. 02/20/14   Brock Badharles A Harper, MD  predniSONE (DELTASONE) 20 MG tablet Take 3 tabs po on first day, 2 tabs second day, 2 tabs third day, 1 tab fourth day, 1 tab 5th day. Take with food. 05/28/14   Hayden Rasmussenavid Makaleigh Reinard, NP   BP 99/63  Pulse 72  Temp(Src) 98.8 F (37.1 C) (Oral)  Resp 16  SpO2 100% Physical Exam  Nursing note and vitals reviewed. Constitutional: She is oriented to person, place, and time. She appears well-developed and well-nourished. No distress.  HENT:  Mouth/Throat: No oropharyngeal exudate.  Bilateral TMs are normal Oropharynx with cobblestoning and a copious amount of clear PND. Minor erythema.  Eyes: Conjunctivae and EOM are normal.  Neck: Normal range of motion. Neck supple.  Cardiovascular: Normal rate, regular rhythm and normal heart sounds.   Pulmonary/Chest: Effort normal.  Inspiratory and expiratory coarseness throughout Slightly prolonged expiratory phase Rare wheeze  Musculoskeletal: She exhibits no edema.  Lymphadenopathy:    She has no cervical adenopathy.  Neurological: She is alert and oriented to person, place, and time. She exhibits normal muscle tone.  Skin: Skin is warm and dry.  Psychiatric: She has a normal mood and affect.    ED Course  Procedures (including critical care time) Labs Review Labs  Reviewed - No data to display  Imaging Review No results found.   MDM   1. Bronchitis, allergic, mild intermittent, uncomplicated   2. Other allergic rhinitis   3. Tobacco abuse disorder     Post Duoneb feels better, breathing easier. Auscultation lungs clear. No wheezing. Good air movement.  flonase nasal spray Allegra or claritin daily Sudafed PE for nasal congestion as needed Robitussin Dm for cough Albuterol inhaler for cough and  wheeze Stop smoking.  Hayden Rasmussen, NP 05/28/14 1026

## 2014-05-28 NOTE — ED Notes (Signed)
C/o sick since yesterday, cough w congestion, smoker . Minimal relief w OTC medications

## 2014-06-05 ENCOUNTER — Emergency Department (HOSPITAL_COMMUNITY)
Admission: EM | Admit: 2014-06-05 | Discharge: 2014-06-05 | Disposition: A | Discharge status: Home / Self Care | Payer: Medicaid Other | Attending: Pediatric Emergency Medicine | Admitting: Pediatric Emergency Medicine

## 2014-06-05 ENCOUNTER — Encounter (HOSPITAL_COMMUNITY): Payer: Self-pay | Admitting: Emergency Medicine

## 2014-06-05 DIAGNOSIS — R21 Rash and other nonspecific skin eruption: Secondary | ICD-10-CM | POA: Insufficient documentation

## 2014-06-05 DIAGNOSIS — IMO0002 Reserved for concepts with insufficient information to code with codable children: Secondary | ICD-10-CM | POA: Insufficient documentation

## 2014-06-05 DIAGNOSIS — Z8719 Personal history of other diseases of the digestive system: Secondary | ICD-10-CM | POA: Diagnosis not present

## 2014-06-05 DIAGNOSIS — B86 Scabies: Secondary | ICD-10-CM | POA: Diagnosis not present

## 2014-06-05 DIAGNOSIS — Z79899 Other long term (current) drug therapy: Secondary | ICD-10-CM | POA: Diagnosis not present

## 2014-06-05 DIAGNOSIS — F17200 Nicotine dependence, unspecified, uncomplicated: Secondary | ICD-10-CM | POA: Insufficient documentation

## 2014-06-05 MED ORDER — PERMETHRIN 5 % EX CREA: TOPICAL_CREAM | CUTANEOUS | Status: DC

## 2014-06-05 NOTE — Discharge Instructions (Signed)

## 2014-06-05 NOTE — ED Notes (Signed)
Pt has had a rash for the last couple months.  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  Pt has a rash mostly on her hands.  sHe tried hydrocortisone.  The rash is itchy.

## 2014-06-05 NOTE — ED Provider Notes (Signed)
CSN: [id number]     Arrival date & time 06/05/14  1559 History   First MD Initiated Contact with Patient 06/05/14 1618     Chief Complaint  Patient presents with  . Rash     (Consider location/radiation/quality/duration/timing/severity/associated sxs/prior Treatment) Patient is a 23 y.o. female presenting with rash. The history is provided by the patient. No language interpreter was used.  Rash Location:  Hand Hand rash location:  L hand and R hand Quality: itchiness   Severity:  Mild Onset quality:  Gradual Duration:  2 months Timing:  Constant Progression:  Unchanged Chronicity:  Chronic Relieved by:  Nothing Worsened by:  Nothing tried Ineffective treatments:  None tried Associated symptoms: no abdominal pain, no fever, no URI, not vomiting and not wheezing     Past Medical History  Diagnosis Date  . No pertinent past medical history   . Gall stones   . Eczema    Past Surgical History  Procedure Laterality Date  . Gallstones removed     Family History  Problem Relation Age of Onset  . Diabetes Mother    History  Substance Use Topics  . Smoking status: Current Every Day Smoker -- 0.25 packs/day for 2 years    Types: Cigarettes  . Smokeless tobacco: Never Used  . Alcohol Use: No   OB History   Grav Para Term Preterm Abortions TAB SAB Ect Mult Living   0 0 0 0 0 0 3     Review of Systems  Constitutional: Negative for fever.  Respiratory: Negative for wheezing.   Gastrointestinal: Negative for vomiting and abdominal pain.  Skin: Positive for rash.  All other systems reviewed and are negative.     Allergies  Review of patient's allergies indicates no known allergies.  Home Medications   Prior to Admission medications   Medication Sig Start Date End Date Taking? Authorizing Provider  albuterol (PROVENTIL HFA;VENTOLIN HFA) 108 (90 BASE) MCG/ACT inhaler Inhale 2 puffs into the lungs every 4 (four) hours as needed for wheezing or shortness of  breath. 05/28/14   Hayden Rasmussen, NP  clotrimazole-betamethasone (LOTRISONE) cream Apply 1 application topically 2 (two) times daily. 02/20/14   Brock Bad, MD  fluocinonide cream (LIDEX) 0.05 % Apply 1 application topically 2 (two) times daily. 02/20/14   Brock Bad, MD  permethrin (ELIMITE) 5 % cream Apply once, allow to dwell for 8 hours and then wash off.  Repeat in one week as necessary. 06/05/14   Ermalinda Memos, MD  predniSONE (DELTASONE) 20 MG tablet Take 3 tabs po on first day, 2 tabs second day, 2 tabs third day, 1 tab fourth day, 1 tab 5th day. Take with food. 05/28/14   Hayden Rasmussen, NP   BP 111/68  Pulse 83  Temp(Src) 98.1 F (36.7 C) (Oral)  Resp 16  Wt 148 lb 5 oz (67.274 kg)  SpO2 97% Physical Exam  Nursing note and vitals reviewed. Constitutional: She appears well-developed and well-nourished.  HENT:  Head: Normocephalic and atraumatic.  Eyes: Conjunctivae are normal.  Neck: Neck supple.  Cardiovascular: Normal rate and normal heart sounds.   Pulmonary/Chest: Effort normal and breath sounds normal.  Abdominal: Soft. Bowel sounds are normal.  Musculoskeletal: Normal range of motion.  Neurological: She is alert.  Skin: Skin is warm and dry.  B/l hands and wrists (worst between fingers) as well as upper waist line with dry papular eruption.  No warmth or tenderness or fluctuance.  ED Course  Procedures (including critical care time) Labs Review Labs Reviewed - No data to display  Imaging Review No results found.   EKG Interpretation None      MDM   Final diagnoses:  Scabies    23 y.o. with rash - concern for scabies as she has two children that co-bed with her who have subsequently gotten a more diffuse but similarly itchy rash.  permethrin once and repeat in a week and f/u with pcp.  Patient comfortable with this plan.    Ermalinda Memos, MD 06/05/14 [phone]

## 2014-06-05 NOTE — ED Notes (Signed)
Pt verbalizes understanding of d/c instructions and denies any further needs at this time. 

## 2014-08-12 ENCOUNTER — Encounter (HOSPITAL_COMMUNITY): Payer: Self-pay | Admitting: Emergency Medicine

## 2014-09-22 ENCOUNTER — Emergency Department (HOSPITAL_COMMUNITY): Payer: Medicaid Other

## 2014-09-22 ENCOUNTER — Encounter (HOSPITAL_COMMUNITY): Payer: Self-pay | Admitting: Family Medicine

## 2014-09-22 ENCOUNTER — Emergency Department (HOSPITAL_COMMUNITY)
Admission: EM | Admit: 2014-09-22 | Discharge: 2014-09-22 | Disposition: A | Discharge status: Home / Self Care | Payer: Medicaid Other | Attending: Emergency Medicine | Admitting: Emergency Medicine

## 2014-09-22 DIAGNOSIS — Y998 Other external cause status: Secondary | ICD-10-CM | POA: Diagnosis not present

## 2014-09-22 DIAGNOSIS — Z7951 Long term (current) use of inhaled steroids: Secondary | ICD-10-CM | POA: Diagnosis not present

## 2014-09-22 DIAGNOSIS — Y929 Unspecified place or not applicable: Secondary | ICD-10-CM | POA: Insufficient documentation

## 2014-09-22 DIAGNOSIS — Z7952 Long term (current) use of systemic steroids: Secondary | ICD-10-CM | POA: Insufficient documentation

## 2014-09-22 DIAGNOSIS — Z8719 Personal history of other diseases of the digestive system: Secondary | ICD-10-CM | POA: Diagnosis not present

## 2014-09-22 DIAGNOSIS — Y9389 Activity, other specified: Secondary | ICD-10-CM | POA: Diagnosis not present

## 2014-09-22 DIAGNOSIS — S99912A Unspecified injury of left ankle, initial encounter: Secondary | ICD-10-CM | POA: Diagnosis present

## 2014-09-22 DIAGNOSIS — W1830XA Fall on same level, unspecified, initial encounter: Secondary | ICD-10-CM | POA: Diagnosis not present

## 2014-09-22 DIAGNOSIS — Z720 Tobacco use: Secondary | ICD-10-CM | POA: Insufficient documentation

## 2014-09-22 DIAGNOSIS — W19XXXA Unspecified fall, initial encounter: Secondary | ICD-10-CM

## 2014-09-22 DIAGNOSIS — M25572 Pain in left ankle and joints of left foot: Secondary | ICD-10-CM

## 2014-09-22 MED ORDER — KETOROLAC TROMETHAMINE 60 MG/2ML IM SOLN
60.0000 mg | Freq: Once | INTRAMUSCULAR | Status: AC
  Administered 2014-09-22: 60 mg via INTRAMUSCULAR
  Filled 2014-09-22: qty 2

## 2014-09-22 NOTE — ED Notes (Signed)
Per pt sts that she fell last night and injured left ankle and lower leg.

## 2014-09-22 NOTE — ED Notes (Signed)
Declined W/C at D/C and was escorted to lobby by RN. 

## 2014-09-22 NOTE — ED Provider Notes (Signed)
CSN: [id number]     Arrival date & time 09/22/14  [id number] History  This chart was scribed for Oswaldo ConroyVictoria Henriette Hesser, PA-C, working with Derwood KaplanAnkit Nanavati, MD by Chestine SporeSoijett Blue, ED Scribe. The patient was seen in room TR08C/TR08C at 10:38 AM.    Chief Complaint  Patient presents with  . Ankle Pain  . Leg Pain    The history is provided by the patient. No language interpreter was used.   HPI Comments: Courtney [name] is a 23 y.o. female who presents to the Emergency Department complaining of worsening left ankle and lower left leg pain onset last night. She reports that she was trying to get something out of the closet and fell. She reports that she lost her balance. She can not sleep because of the pain. She states that she is having associated symptoms of joint swelling. She denies taking any medications for her symptoms. The pain is worsened with lifting, walking on the foot. She denies redness, numbness, tingling, weakness, fever, chills, nausea, vomiting, and any other symptoms.  Past Medical History  Diagnosis Date  . No pertinent past medical history   . Gall stones   . Eczema    Past Surgical History  Procedure Laterality Date  . Gallstones removed     Family History  Problem Relation Age of Onset  . Diabetes Mother    History  Substance Use Topics  . Smoking status: Current Every Day Smoker -- 0.25 packs/day for 2 years    Types: Cigarettes  . Smokeless tobacco: Never Used  . Alcohol Use: No   OB History    Gravida Para Term Preterm AB TAB SAB Ectopic Multiple Living   3 3 3  0 0 0 0 0 0 3     Review of Systems  Constitutional: Negative for fever and chills.  Gastrointestinal: Negative for nausea and vomiting.  Musculoskeletal: Positive for myalgias, joint swelling and arthralgias.  Neurological: Negative for weakness and numbness.      Allergies  Review of patient's allergies indicates no known allergies.  Home Medications   Prior to Admission medications   Medication  Sig Start Date End Date Taking? Authorizing Provider  albuterol (PROVENTIL HFA;VENTOLIN HFA) 108 (90 BASE) MCG/ACT inhaler Inhale 2 puffs into the lungs every 4 (four) hours as needed for wheezing or shortness of breath. 05/28/14   Hayden Rasmussenavid Mabe, NP  clotrimazole-betamethasone (LOTRISONE) cream Apply 1 application topically 2 (two) times daily. 02/20/14   Brock Badharles A Harper, MD  fluocinonide cream (LIDEX) 0.05 % Apply 1 application topically 2 (two) times daily. 02/20/14   Brock Badharles A Harper, MD  permethrin (ELIMITE) 5 % cream Apply once, allow to dwell for 8 hours and then wash off.  Repeat in one week as necessary. 06/05/14   Ermalinda MemosShad M Baab, MD  predniSONE (DELTASONE) 20 MG tablet Take 3 tabs po on first day, 2 tabs second day, 2 tabs third day, 1 tab fourth day, 1 tab 5th day. Take with food. 05/28/14   Hayden Rasmussenavid Mabe, NP   BP 112/75 mmHg  Pulse 92  Temp(Src) 98.5 F (36.9 C) (Oral)  Resp 18  SpO2 100%  LMP 08/25/2014  Physical Exam  Constitutional: She appears well-developed and well-nourished. No distress.  HENT:  Head: Normocephalic and atraumatic.  Eyes: Conjunctivae are normal. Right eye exhibits no discharge. Left eye exhibits no discharge.  Pulmonary/Chest: Effort normal. No respiratory distress.  Musculoskeletal: She exhibits tenderness. She exhibits no edema.  Inversion of the left ankle. No apparent swelling or erythema,  no warmth. Tenderness to posterior left lateral malleolus and distally. Achilles tendon intact. Pain to posterior distal lower leg. No proximal left leg tenderness. Full ROM of left ankle with pain.   Neurological: She is alert. Coordination normal.  Skin: She is not diaphoretic.  No wounds.   Psychiatric: She has a normal mood and affect. Her behavior is normal.  Nursing note and vitals reviewed.   ED Course  Procedures (including critical care time) DIAGNOSTIC STUDIES: Oxygen Saturation is 100% on room air, normal by my interpretation.    COORDINATION OF CARE: 10:43  AM-Discussed treatment plan which includes X-ray of left foot, X-ray of left ankle, X-ray of left leg with pt at bedside and pt agreed to plan.   Labs Review Labs Reviewed - No data to display  Imaging Review Dg Ankle Complete Left  09/22/2014   CLINICAL DATA:  Left foot pain and left ankle pain after a fall last night. Pt states she fell while walking through the house and twisted her ankle. Pain is radiating from the plantar surface of foot up the back of her left leg. No priors  EXAM: LEFT ANKLE COMPLETE - 3+ VIEW  COMPARISON:  None.  FINDINGS: There is no evidence of fracture, dislocation, or joint effusion. There is no evidence of arthropathy or other focal bone abnormality. Soft tissues are unremarkable.  IMPRESSION: Negative.   Electronically Signed   By: Amie Portlandavid  Ormond M.D.   On: 09/22/2014 11:46   Dg Foot Complete Left  09/22/2014   CLINICAL DATA:  Left foot pain and left ankle pain after a fall last night. Pt states she fell while walking through the house and twisted her ankle. Pain is radiating from the plantar surface of foot up the back of her left leg. No priors  EXAM: LEFT FOOT - COMPLETE 3+ VIEW  COMPARISON:  None.  FINDINGS: There is no evidence of fracture or dislocation. There is no evidence of arthropathy or other focal bone abnormality. Soft tissues are unremarkable.  IMPRESSION: Negative.   Electronically Signed   By: Amie Portlandavid  Ormond M.D.   On: 09/22/2014 11:44     EKG Interpretation None      MDM   Final diagnoses:  Fall  Ankle pain, left   Patient X-Ray negative for obvious fracture or dislocation. Pain managed in ED. Pt without erythema, edema or warmth to joint. I doubt septic arthritis. Pt advised to follow up with PCP if symptoms persist. Patient given brace while in ED, RICE, ibuprofen and conservative therapy recommended and discussed.   Discussed return precautions with patient. Discussed all results and patient verbalizes understanding and agrees with  plan.  I personally performed the services described in this documentation, which was scribed in my presence. The recorded information has been reviewed and is accurate.    Louann SjogrenVictoria L Sadako Cegielski, PA-C 09/22/14 1210  Derwood KaplanAnkit Nanavati, MD 09/22/14 [phone]

## 2014-09-22 NOTE — Discharge Instructions (Signed)
Return to the emergency room with worsening of symptoms, new symptoms or with symptoms that are concerning, a fevers, redness, swelling, red streaks from the area warmth. Unable to move her ankle. RICE: Rest, Ice (three cycles of 20 mins on, 20mins off at least twice a day), compression/brace, elevation. Heating pad works well for back pain. Ibuprofen 400mg  (2 tablets 200mg ) every 5-6 hours for 3-5 days and then as needed for pain. Follow up with PCP/orthopedist if symptoms worsen or are persistent.   Ankle Pain Ankle pain is a common symptom. The bones, cartilage, tendons, and muscles of the ankle joint perform a lot of work each day. The ankle joint holds your body weight and allows you to move around. Ankle pain can occur on either side or back of 1 or both ankles. Ankle pain may be sharp and burning or dull and aching. There may be tenderness, stiffness, redness, or warmth around the ankle. The pain occurs more often when a person walks or puts pressure on the ankle. CAUSES  There are many reasons ankle pain can develop. It is important to work with your caregiver to identify the cause since many conditions can impact the bones, cartilage, muscles, and tendons. Causes for ankle pain include:  Injury, including a break (fracture), sprain, or strain often due to a fall, sports, or a high-impact activity.  Swelling (inflammation) of a tendon (tendonitis).  Achilles tendon rupture.  Ankle instability after repeated sprains and strains.  Poor foot alignment.  Pressure on a nerve (tarsal tunnel syndrome).  Arthritis in the ankle or the lining of the ankle.  Crystal formation in the ankle (gout or pseudogout). DIAGNOSIS  A diagnosis is based on your medical history, your symptoms, results of your physical exam, and results of diagnostic tests. Diagnostic tests may include X-ray exams or a computerized magnetic scan (magnetic resonance imaging, MRI). TREATMENT  Treatment will depend on the  cause of your ankle pain and may include:  Keeping pressure off the ankle and limiting activities.  Using crutches or other walking support (a cane or brace).  Using rest, ice, compression, and elevation.  Participating in physical therapy or home exercises.  Wearing shoe inserts or special shoes.  Losing weight.  Taking medications to reduce pain or swelling or receiving an injection.  Undergoing surgery. HOME CARE INSTRUCTIONS   Only take over-the-counter or prescription medicines for pain, discomfort, or fever as directed by your caregiver.  Put ice on the injured area.  Put ice in a plastic bag.  Place a towel between your skin and the bag.  Leave the ice on for 15-20 minutes at a time, 03-04 times a day.  Keep your leg raised (elevated) when possible to lessen swelling.  Avoid activities that cause ankle pain.  Follow specific exercises as directed by your caregiver.  Record how often you have ankle pain, the location of the pain, and what it feels like. This information may be helpful to you and your caregiver.  Ask your caregiver about returning to work or sports and whether you should drive.  Follow up with your caregiver for further examination, therapy, or testing as directed. SEEK MEDICAL CARE IF:   Pain or swelling continues or worsens beyond 1 week.  You have an oral temperature above 102 F (38.9 C).  You are feeling unwell or have chills.  You are having an increasingly difficult time with walking.  You have loss of sensation or other new symptoms.  You have questions or concerns.  MAKE SURE YOU:   Understand these instructions.  Will watch your condition.  Will get help right away if you are not doing well or get worse. Document Released: 03/17/2010 Document Revised: 12/20/2011 Document Reviewed: 03/17/2010 [facility] Patient Information 2015 [place], [place]. This information is not intended to replace advice given to you by your health care  provider. Make sure you discuss any questions you have with your health care provider.
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
## 2015-03-15 ENCOUNTER — Emergency Department (HOSPITAL_COMMUNITY)
Admission: EM | Admit: 2015-03-15 | Discharge: 2015-03-15 | Disposition: A | Discharge status: Home / Self Care | Payer: Medicaid Other | Attending: Emergency Medicine | Admitting: Emergency Medicine

## 2015-03-15 ENCOUNTER — Encounter (HOSPITAL_COMMUNITY): Payer: Self-pay | Admitting: Emergency Medicine

## 2015-03-15 DIAGNOSIS — Z79899 Other long term (current) drug therapy: Secondary | ICD-10-CM | POA: Insufficient documentation

## 2015-03-15 DIAGNOSIS — Z720 Tobacco use: Secondary | ICD-10-CM | POA: Insufficient documentation

## 2015-03-15 DIAGNOSIS — Z8719 Personal history of other diseases of the digestive system: Secondary | ICD-10-CM | POA: Insufficient documentation

## 2015-03-15 DIAGNOSIS — Z7952 Long term (current) use of systemic steroids: Secondary | ICD-10-CM | POA: Insufficient documentation

## 2015-03-15 DIAGNOSIS — B369 Superficial mycosis, unspecified: Secondary | ICD-10-CM

## 2015-03-15 DIAGNOSIS — Z872 Personal history of diseases of the skin and subcutaneous tissue: Secondary | ICD-10-CM | POA: Insufficient documentation

## 2015-03-15 MED ORDER — CLOTRIMAZOLE 1 % EX CREA: TOPICAL_CREAM | CUTANEOUS | Status: DC

## 2015-03-15 NOTE — ED Notes (Signed)
Patient here with body rash. Appears as dark spots on skin. States skin is itching.

## 2015-03-15 NOTE — ED Notes (Signed)
Per PA pt allowed to return to work on 03/18/15 due to contagious skin infection

## 2015-03-15 NOTE — Discharge Instructions (Signed)
Use lotrimin cream as directed until gone. Return to the ED with worsening or concerning symptoms.

## 2015-03-15 NOTE — ED Provider Notes (Signed)
CSN: [id number]     Arrival date & time 03/15/15  1940 History   First MD Initiated Contact with Patient 03/15/15 2027     Chief Complaint  Patient presents with  . Rash     (Consider location/radiation/quality/duration/timing/severity/associated sxs/prior Treatment) HPI Comments: Patient is a 24 year old female who presents with a 3 week history of a rash. The rash started gradually and progressively worsened since the onset. The rash is located on her entire body but started on her back. Patient has tried selsen blue without relief. Patient denies new exposures to medications, soaps, lotions, detergent. Patient reports associated itching. No aggravating/alleviating factors. Patient denies fever, chills, NVD, sore throat, oral lesions, ocular involvement, throat closing, wheezing, SOB, chest pain, abdominal pain.     Patient is a 24 y.o. female presenting with rash.  Rash   Past Medical History  Diagnosis Date  . No pertinent past medical history   . Gall stones   . Eczema    Past Surgical History  Procedure Laterality Date  . Gallstones removed     Family History  Problem Relation Age of Onset  . Diabetes Mother    History  Substance Use Topics  . Smoking status: Current Every Day Smoker -- 0.25 packs/day for 2 years    Types: Cigarettes  . Smokeless tobacco: Never Used  . Alcohol Use: No   OB History    Gravida Para Term Preterm AB TAB SAB Ectopic Multiple Living   0 0 0 0 0 0 3     Review of Systems  Skin: Positive for rash.  All other systems reviewed and are negative.     Allergies  Review of patient's allergies indicates no known allergies.  Home Medications   Prior to Admission medications   Medication Sig Start Date End Date Taking? Authorizing Provider  albuterol (PROVENTIL HFA;VENTOLIN HFA) 108 (90 BASE) MCG/ACT inhaler Inhale 2 puffs into the lungs every 4 (four) hours as needed for wheezing or shortness of breath. 05/28/14   Hayden Rasmussen, NP   clotrimazole-betamethasone (LOTRISONE) cream Apply 1 application topically 2 (two) times daily. 02/20/14   Brock Bad, MD  fluocinonide cream (LIDEX) 0.05 % Apply 1 application topically 2 (two) times daily. 02/20/14   Brock Bad, MD  permethrin (ELIMITE) 5 % cream Apply once, allow to dwell for 8 hours and then wash off.  Repeat in one week as necessary. 06/05/14   Sharene Skeans, MD  predniSONE (DELTASONE) 20 MG tablet Take 3 tabs po on first day, 2 tabs second day, 2 tabs third day, 1 tab fourth day, 1 tab 5th day. Take with food. 05/28/14   Hayden Rasmussen, NP   BP 118/85 mmHg  Pulse 77  Temp(Src) 99 F (37.2 C)  Resp 20  Ht  (1.626 m)  Wt 145 lb (65.772 kg)  BMI 24.88 kg/m2  SpO2 96%  LMP 02/12/2015 (Exact Date)  Breastfeeding? No Physical Exam  Constitutional: She is oriented to person, place, and time. She appears well-developed and well-nourished. No distress.  HENT:  Head: Normocephalic and atraumatic.  Eyes: Conjunctivae and EOM are normal.  Neck: Normal range of motion.  Cardiovascular: Normal rate and regular rhythm.  Exam reveals no gallop and no friction rub.   No murmur heard. Pulmonary/Chest: Effort normal and breath sounds normal. She has no wheezes. She has no rales. She exhibits no tenderness.  Abdominal: Soft. She exhibits no distension. There is no tenderness. There is no  rebound.  Musculoskeletal: Normal range of motion.  Neurological: She is alert and oriented to person, place, and time. Coordination normal.  Speech is goal-oriented. Moves limbs without ataxia.   Skin: Skin is warm and dry.  Hyperpigmented, dry macules of bilateral arms, back, neck, and torso the is pruritic.   Psychiatric: She has a normal mood and affect. Her behavior is normal.  Nursing note and vitals reviewed.   ED Course  Procedures (including critical care time) Labs Review Labs Reviewed - No data to display  Imaging Review No results found.   EKG Interpretation None       MDM   Final diagnoses:  Fungal skin infection    9:03 PM Patient appears to have a fungal infection of the skin. Patient will be treated with topical antifungal cream. Vitals stable and patient afebrile.    Emilia BeckKaitlyn Callum Wolf, PA-C 03/15/15 2108  Glynn OctaveStephen Rancour, MD 03/16/15 [phone]

## 2015-11-03 ENCOUNTER — Ambulatory Visit: Payer: Medicaid Other | Admitting: Obstetrics

## 2016-03-02 ENCOUNTER — Ambulatory Visit (INDEPENDENT_AMBULATORY_CARE_PROVIDER_SITE_OTHER): Payer: Medicaid Other | Admitting: Obstetrics

## 2016-03-02 ENCOUNTER — Encounter: Payer: Self-pay | Admitting: Obstetrics

## 2016-03-02 VITALS — BP 116/72 | HR 68 | Wt 129.0 lb

## 2016-03-02 DIAGNOSIS — Z202 Contact with and (suspected) exposure to infections with a predominantly sexual mode of transmission: Secondary | ICD-10-CM | POA: Diagnosis not present

## 2016-03-02 MED ORDER — AZITHROMYCIN 250 MG PO TABS: 1000.0000 mg | ORAL_TABLET | Freq: Once | ORAL | Status: DC

## 2016-03-02 MED ORDER — CEFIXIME 400 MG PO TABS: 400.0000 mg | ORAL_TABLET | Freq: Every day | ORAL | Status: DC

## 2016-03-03 ENCOUNTER — Encounter: Payer: Self-pay | Admitting: Obstetrics

## 2016-03-03 NOTE — Progress Notes (Signed)
Patient ID: Courtney [name], female   DOB: 07-Jul-1991, 25 y.o.   MRN: [id number]  Chief Complaint  Patient presents with  . STD testing    HPI Courtney [name] is a 25 y.o. female.  Exposure to chlamydia, untreated.  Partner positive for chlamydia.  HPI  Past Medical History  Diagnosis Date  . No pertinent past medical history   . Gall stones   . Eczema     Past Surgical History  Procedure Laterality Date  . Gallstones removed      Family History  Problem Relation Age of Onset  . Diabetes Mother     Social History Social History  Substance Use Topics  . Smoking status: Current Every Day Smoker -- 0.25 packs/day for 2 years    Types: Cigarettes  . Smokeless tobacco: Never Used  . Alcohol Use: No    No Known Allergies  Current Outpatient Prescriptions  Medication Sig Dispense Refill  . azithromycin (ZITHROMAX) 250 MG tablet Take 4 tablets (1,000 mg total) by mouth once. 4 tablet 0  . cefixime (SUPRAX) 400 MG tablet Take 1 tablet (400 mg total) by mouth daily. 1 tablet 0   No current facility-administered medications for this visit.    Review of Systems Review of Systems Constitutional: negative for fatigue and weight loss Respiratory: negative for cough and wheezing Cardiovascular: negative for chest pain, fatigue and palpitations Gastrointestinal: negative for abdominal pain and change in bowel habits Genitourinary:negative Integument/breast: negative for nipple discharge Musculoskeletal:negative for myalgias Neurological: negative for gait problems and tremors Behavioral/Psych: negative for abusive relationship, depression Endocrine: negative for temperature intolerance     Blood pressure 116/72, pulse 68, weight 129 lb (58.514 kg), last menstrual period 02/03/2016.  Physical Exam Physical Exam            General:  Alert and no distress Abdomen:  normal findings: no organomegaly, soft, non-tender and no hernia  Pelvis:  External genitalia: normal general  appearance Urinary system: urethral meatus normal and bladder without fullness, nontender Vaginal: normal without tenderness, induration or masses Cervix: normal appearance Adnexa: normal bimanual exam Uterus: anteverted and non-tender, normal size      Data Reviewed Labs  Assessment     Chlamydia exposure, untreated.  Plan:    Wet prep and cultures done. Azithromycin / Suprax Rx F/U 3 months for Chlamydia TOC.   No orders of the defined types were placed in this encounter.   Meds ordered this encounter  Medications  . azithromycin (ZITHROMAX) 250 MG tablet    Sig: Take 4 tablets (1,000 mg total) by mouth once.    Dispense:  4 tablet    Refill:  0  . cefixime (SUPRAX) 400 MG tablet    Sig: Take 1 tablet (400 mg total) by mouth daily.    Dispense:  1 tablet    Refill:  0

## 2016-03-05 ENCOUNTER — Other Ambulatory Visit: Payer: Self-pay | Admitting: Obstetrics

## 2016-03-05 DIAGNOSIS — N760 Acute vaginitis: Principal | ICD-10-CM

## 2016-03-05 DIAGNOSIS — B9689 Other specified bacterial agents as the cause of diseases classified elsewhere: Secondary | ICD-10-CM

## 2016-03-05 LAB — NUSWAB VG+, CANDIDA 6SP
Atopobium vaginae: HIGH Score — AB
BVAB 2: HIGH Score — AB
CANDIDA ALBICANS, NAA: NEGATIVE
CANDIDA GLABRATA, NAA: NEGATIVE
Candida krusei, NAA: NEGATIVE
Candida lusitaniae, NAA: NEGATIVE
Candida parapsilosis, NAA: NEGATIVE
Candida tropicalis, NAA: NEGATIVE
Chlamydia trachomatis, NAA: POSITIVE — AB
MEGASPHAERA 1: HIGH {score} — AB
Neisseria gonorrhoeae, NAA: NEGATIVE
Trich vag by NAA: NEGATIVE

## 2016-03-05 MED ORDER — METRONIDAZOLE 500 MG PO TABS: 500.0000 mg | ORAL_TABLET | Freq: Two times a day (BID) | ORAL | Status: DC

## 2016-03-06 ENCOUNTER — Ambulatory Visit (HOSPITAL_COMMUNITY)
Admission: EM | Admit: 2016-03-06 | Discharge: 2016-03-06 | Disposition: A | Discharge status: Home / Self Care | Payer: Medicaid Other | Attending: Emergency Medicine | Admitting: Emergency Medicine

## 2016-03-06 ENCOUNTER — Ambulatory Visit (INDEPENDENT_AMBULATORY_CARE_PROVIDER_SITE_OTHER): Payer: Medicaid Other

## 2016-03-06 ENCOUNTER — Encounter (HOSPITAL_COMMUNITY): Payer: Self-pay | Admitting: *Deleted

## 2016-03-06 DIAGNOSIS — T148XXA Other injury of unspecified body region, initial encounter: Secondary | ICD-10-CM

## 2016-03-06 DIAGNOSIS — T148 Other injury of unspecified body region: Secondary | ICD-10-CM

## 2016-03-06 DIAGNOSIS — S0191XA Laceration without foreign body of unspecified part of head, initial encounter: Secondary | ICD-10-CM | POA: Diagnosis not present

## 2016-03-06 MED ORDER — LIDOCAINE HCL (PF) 2 % IJ SOLN
INTRAMUSCULAR | Status: AC
  Filled 2016-03-06: qty 2

## 2016-03-06 MED ORDER — HYDROCODONE-ACETAMINOPHEN 5-325 MG PO TABS: 1.0000 | ORAL_TABLET | Freq: Four times a day (QID) | ORAL | Status: DC | PRN
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
## 2016-03-11 ENCOUNTER — Encounter (HOSPITAL_COMMUNITY): Payer: Self-pay | Admitting: Emergency Medicine

## 2016-03-11 ENCOUNTER — Ambulatory Visit (HOSPITAL_COMMUNITY)
Admission: EM | Admit: 2016-03-11 | Discharge: 2016-03-11 | Disposition: A | Discharge status: Home / Self Care | Payer: Medicaid Other | Attending: Family Medicine | Admitting: Family Medicine

## 2016-03-11 DIAGNOSIS — Z4802 Encounter for removal of sutures: Secondary | ICD-10-CM

## 2016-03-11 NOTE — ED Notes (Signed)
Per VO... Applied bacitracin to area.

## 2016-03-11 NOTE — ED Provider Notes (Signed)
CSN: [id number]     Arrival date & time 03/11/16  1554 History   First MD Initiated Contact with Patient 03/11/16 1653     Chief Complaint  Patient presents with  . Suture / Staple Removal   (Consider location/radiation/quality/duration/timing/severity/associated sxs/prior Treatment) Patient is a 25 y.o. female presenting with suture removal. The history is provided by the patient.  Suture / Staple Removal This is a new problem. The current episode started more than 2 days ago. The problem has been resolved.    Past Medical History  Diagnosis Date  . No pertinent past medical history   . Gall stones   . Eczema    Past Surgical History  Procedure Laterality Date  . Gallstones removed     Family History  Problem Relation Age of Onset  . Diabetes Mother    Social History  Substance Use Topics  . Smoking status: Current Every Day Smoker -- 0.25 packs/day for 2 years    Types: Cigarettes  . Smokeless tobacco: Never Used  . Alcohol Use: No   OB History    Gravida Para Term Preterm AB TAB SAB Ectopic Multiple Living   3 3 3  0 0 0 0 0 0 3     Review of Systems  Constitutional: Negative.   Skin: Positive for wound.  All other systems reviewed and are negative.   Allergies  Review of patient's allergies indicates no known allergies.  Home Medications   Prior to Admission medications   Medication Sig Start Date End Date Taking? Authorizing Provider  cefixime (SUPRAX) 400 MG tablet Take 1 tablet (400 mg total) by mouth daily. 03/02/16   Brock Badharles A Harper, MD  HYDROcodone-acetaminophen (NORCO) 5-325 MG tablet Take 1 tablet by mouth every 6 (six) hours as needed for moderate pain. 03/06/16   Charm RingsErin J Honig, MD  metroNIDAZOLE (FLAGYL) 500 MG tablet Take 1 tablet (500 mg total) by mouth 2 (two) times daily. 03/05/16   Brock Badharles A Harper, MD   Meds Ordered and Administered this Visit  Medications - No data to display  BP 118/79 mmHg  Pulse 80  Temp(Src) 99.4 F (37.4 C) (Oral)   Resp 14  SpO2 100%  LMP 03/04/2016 No data found.   Physical Exam  Constitutional: She is oriented to person, place, and time. She appears well-developed and well-nourished. No distress.  Neurological: She is alert and oriented to person, place, and time.  Skin: Skin is warm and dry.  Forehead lac healed.  Nursing note and vitals reviewed.   ED Course  Procedures (including critical care time)  Labs Review Labs Reviewed - No data to display  Imaging Review No results found.   Visual Acuity Review  Right Eye Distance:   Left Eye Distance:   Bilateral Distance:    Right Eye Near:   Left Eye Near:    Bilateral Near:         MDM   1. Encounter for removal of sutures    3 stitches removed without diff.    Linna HoffJames D Alfonso Shackett, MD 03/11/16 [phone]

## 2016-03-11 NOTE — Discharge Instructions (Signed)
Return as needed

## 2016-03-11 NOTE — ED Notes (Signed)
Here for a f/u and to have sutures removed... Voices no new concerns... A&O x4... No acute distress.

## 2016-03-22 ENCOUNTER — Telehealth: Payer: Self-pay | Admitting: *Deleted

## 2016-03-22 NOTE — Telephone Encounter (Signed)
Patient is requesting a refill of her Hydrocortisone cream 2.5%. [facility]. Told patient I would send request to provider- please give 24 hours for response.

## 2016-05-17 ENCOUNTER — Encounter: Payer: Self-pay | Admitting: *Deleted

## 2016-06-02 ENCOUNTER — Ambulatory Visit: Payer: Self-pay | Admitting: Certified Nurse Midwife

## 2016-06-02 ENCOUNTER — Ambulatory Visit: Payer: Medicaid Other | Admitting: Obstetrics

## 2016-12-06 ENCOUNTER — Ambulatory Visit (INDEPENDENT_AMBULATORY_CARE_PROVIDER_SITE_OTHER): Payer: Medicaid Other | Admitting: Obstetrics

## 2016-12-06 ENCOUNTER — Encounter: Payer: Self-pay | Admitting: Obstetrics

## 2016-12-06 VITALS — BP 111/71 | HR 84 | Wt 136.8 lb

## 2016-12-06 DIAGNOSIS — Z3049 Encounter for surveillance of other contraceptives: Secondary | ICD-10-CM | POA: Diagnosis not present

## 2016-12-06 DIAGNOSIS — Z3046 Encounter for surveillance of implantable subdermal contraceptive: Secondary | ICD-10-CM

## 2016-12-06 DIAGNOSIS — Z30011 Encounter for initial prescription of contraceptive pills: Secondary | ICD-10-CM

## 2016-12-06 DIAGNOSIS — Z3202 Encounter for pregnancy test, result negative: Secondary | ICD-10-CM | POA: Diagnosis not present

## 2016-12-06 LAB — POCT URINE PREGNANCY: PREG TEST UR: NEGATIVE

## 2016-12-06 MED ORDER — NORETHINDRONE-ETH ESTRADIOL 1-35 MG-MCG PO TABS: 1.0000 | ORAL_TABLET | Freq: Every day | ORAL | 11 refills | Status: DC

## 2016-12-06 NOTE — Progress Notes (Signed)
Patient is in the office today to get her Nexplanon removed. Patient states it is time for the device to be removed. She thinks she may want to use Depo.

## 2016-12-06 NOTE — Progress Notes (Signed)

## 2017-04-14 ENCOUNTER — Ambulatory Visit: Payer: Medicaid Other | Admitting: Obstetrics

## 2017-05-04 ENCOUNTER — Ambulatory Visit: Payer: Medicaid Other | Admitting: Obstetrics

## 2017-06-06 ENCOUNTER — Ambulatory Visit: Payer: Medicaid Other | Admitting: Obstetrics

## 2017-06-11 ENCOUNTER — Ambulatory Visit (HOSPITAL_COMMUNITY)
Admission: EM | Admit: 2017-06-11 | Discharge: 2017-06-11 | Disposition: A | Discharge status: Home / Self Care | Payer: Medicaid Other | Attending: Radiology | Admitting: Radiology

## 2017-06-11 ENCOUNTER — Encounter (HOSPITAL_COMMUNITY): Payer: Self-pay | Admitting: *Deleted

## 2017-06-11 DIAGNOSIS — L309 Dermatitis, unspecified: Secondary | ICD-10-CM

## 2017-06-11 MED ORDER — TRIAMCINOLONE ACETONIDE 0.025 % EX OINT: 1.0000 "application " | TOPICAL_OINTMENT | Freq: Two times a day (BID) | CUTANEOUS | 0 refills | Status: DC

## 2017-06-11 NOTE — ED Triage Notes (Signed)
Reports skin discoloration rash to BUE and torso x 1 month.

## 2017-06-11 NOTE — ED Provider Notes (Signed)
[facility]    CSN: [id number] Arrival date & time: 06/11/17  1245     History   Chief Complaint Chief Complaint  Patient presents with  . Rash    HPI Courtney [name] is a 26 y.o. female.   26 y.o. female presents with excerebration of eczema to left arm chest and upper back   X. Condition is acute on chronic in nature. Condition is made better by nothing. Condition is made worse by nothing. Patient denies any treatment  prior to there arrival at this facility. Patient endorses urticaria. Rash appears with erythremic and with scales.         Past Medical History:  Diagnosis Date  . Eczema   . Gall stones     Patient Active Problem List   Diagnosis Date Noted  . Indication for care in labor or delivery 09/27/2013  . Normal delivery 09/27/2013  . Right orbit fracture (HCC) 08/10/2013  . Eczema 04/17/2013  . Mild hyperemesis gravidarum, antepartum 02/19/2013  . General counseling for initiation of other contraceptive measures 01/02/2013  . Anemia, iron deficiency 05/30/2012    Past Surgical History:  Procedure Laterality Date  . gallstones removed      OB History    Gravida Para Term Preterm AB Living   3 3 3  0 0 3   SAB TAB Ectopic Multiple Live Births   0 0 0 0 3       Home Medications    Prior to Admission medications   Medication Sig Start Date End Date Taking? Authorizing Provider  norethindrone-ethinyl estradiol 1/35 (ORTHO-NOVUM 1/35, 28,) tablet Take 1 tablet by mouth daily. 12/06/16   Brock Bad, MD    Family History Family History  Problem Relation Age of Onset  . Diabetes Mother     Social History Social History  Substance Use Topics  . Smoking status: Current Every Day Smoker    Packs/day: 0.25    Years: 2.00    Types: Cigarettes  . Smokeless tobacco: Never Used  . Alcohol use No     Allergies   Patient has no known allergies.   Review of Systems Review of Systems  Constitutional: Negative for chills and  fever.  HENT: Negative for ear pain and sore throat.   Eyes: Negative for pain and visual disturbance.  Respiratory: Negative for cough and shortness of breath.   Cardiovascular: Negative for chest pain and palpitations.  Gastrointestinal: Negative for abdominal pain and vomiting.  Genitourinary: Negative for dysuria and hematuria.  Musculoskeletal: Negative for arthralgias and back pain.  Skin: Positive for rash (v left arm, chest and uppper back with itching). Negative for color change.  Neurological: Negative for seizures and syncope.  All other systems reviewed and are negative.    Physical Exam Triage Vital Signs ED Triage Vitals [06/11/17 1352]  Enc Vitals Group     BP (!) 97/56     Pulse Rate 63     Resp 16     Temp 98 F (36.7 C)     Temp Source Oral     SpO2 100 %     Weight      Height      Head Circumference      Peak Flow      Pain Score      Pain Loc      Pain Edu?      Excl. in GC?    No data found.   Updated Vital Signs BP Marland Kitchen)  97/56   Pulse 63   Temp 98 F (36.7 C) (Oral)   Resp 16   LMP 06/07/2017 (Approximate)   SpO2 100%   Visual Acuity Right Eye Distance:   Left Eye Distance:   Bilateral Distance:    Right Eye Near:   Left Eye Near:    Bilateral Near:     Physical Exam  Constitutional: She appears well-developed and well-nourished. No distress.  HENT:  Head: Normocephalic and atraumatic.  Eyes: Conjunctivae are normal.  Neck: Neck supple.  Cardiovascular: Normal rate and regular rhythm.   No murmur heard. Pulmonary/Chest: Effort normal and breath sounds normal. No respiratory distress.  Abdominal: Soft. There is no tenderness.  Musculoskeletal: She exhibits no edema.  Neurological: She is alert.  Skin: Skin is warm and dry. Rash noted.  Rash to upper left arm, chest and upper back appears with erythremic and with scales.  Psychiatric: She has a normal mood and affect.  Nursing note and vitals reviewed.    UC Treatments /  Results  Labs (all labs ordered are listed, but only abnormal results are displayed) Labs Reviewed - No data to display  EKG  EKG Interpretation None       Radiology No results found.  Procedures Procedures (including critical care time)  Medications Ordered in UC Medications - No data to display   Initial Impression / Assessment and Plan / UC Course  I have reviewed the triage vital signs and the nursing notes.  Pertinent labs & imaging results that were available during my care of the patient were reviewed by me and considered in my medical decision making (see chart for details).       Final Clinical Impressions(s) / UC Diagnoses   Final diagnoses:  None    New Prescriptions New Prescriptions   No medications on file     Controlled Substance Prescriptions [place] Controlled Substance Registry consulted? Not Applicable   Alene MiresOmohundro, Jashan Cotten C, NP 06/11/17 1418

## 2017-10-06 ENCOUNTER — Encounter: Payer: Self-pay | Admitting: Obstetrics

## 2017-10-06 ENCOUNTER — Other Ambulatory Visit (HOSPITAL_COMMUNITY)
Admission: RE | Admit: 2017-10-06 | Discharge: 2017-10-06 | Disposition: A | Discharge status: Home / Self Care | Payer: Medicaid Other | Source: Ambulatory Visit | Attending: Obstetrics | Admitting: Obstetrics

## 2017-10-06 ENCOUNTER — Ambulatory Visit (INDEPENDENT_AMBULATORY_CARE_PROVIDER_SITE_OTHER): Payer: Medicaid Other | Admitting: Obstetrics

## 2017-10-06 VITALS — BP 112/77 | HR 90 | Ht 64.5 in | Wt 140.0 lb

## 2017-10-06 DIAGNOSIS — Z3042 Encounter for surveillance of injectable contraceptive: Secondary | ICD-10-CM | POA: Diagnosis not present

## 2017-10-06 DIAGNOSIS — Z01419 Encounter for gynecological examination (general) (routine) without abnormal findings: Secondary | ICD-10-CM

## 2017-10-06 DIAGNOSIS — Z30013 Encounter for initial prescription of injectable contraceptive: Secondary | ICD-10-CM

## 2017-10-06 DIAGNOSIS — Z3202 Encounter for pregnancy test, result negative: Secondary | ICD-10-CM | POA: Diagnosis not present

## 2017-10-06 DIAGNOSIS — Z0000 Encounter for general adult medical examination without abnormal findings: Secondary | ICD-10-CM

## 2017-10-06 DIAGNOSIS — Z113 Encounter for screening for infections with a predominantly sexual mode of transmission: Secondary | ICD-10-CM

## 2017-10-06 DIAGNOSIS — Z3009 Encounter for other general counseling and advice on contraception: Secondary | ICD-10-CM

## 2017-10-06 LAB — POCT URINE PREGNANCY: Preg Test, Ur: NEGATIVE

## 2017-10-06 MED ORDER — MEDROXYPROGESTERONE ACETATE 150 MG/ML IM SUSP
150.0000 mg | Freq: Once | INTRAMUSCULAR | Status: AC
  Administered 2017-10-06: 150 mg via INTRAMUSCULAR

## 2017-10-06 MED ORDER — MEDROXYPROGESTERONE ACETATE 150 MG/ML IM SUSP: 150.0000 mg | INTRAMUSCULAR | 0 refills | Status: DC

## 2017-10-06 NOTE — Progress Notes (Signed)
Patient presents for her Annual Exam today.Pt has no concerns today.  LMP:09/27/2017 Contraception: NONE  At this time. Considering Depo STD testing desired.

## 2017-10-06 NOTE — Progress Notes (Signed)
Pt rto for Depo inj. UPT neg. Pt supplied Depo given L upper outer quad w/o difficulty. Next Depo due Mar 15-Mar 29.

## 2017-10-06 NOTE — Progress Notes (Signed)
Subjective:        Courtney [name] is a 26 y.o. female here for a routine exam.  Current complaints: None.    Personal health questionnaire:  Is patient Ashkenazi Jewish, have a family history of breast and/or ovarian cancer: no Is there a family history of uterine cancer diagnosed at age < 8950, gastrointestinal cancer, urinary tract cancer, family member who is a Personnel officerLynch syndrome-associated carrier: no Is the patient overweight and hypertensive, family history of diabetes, personal history of gestational diabetes, preeclampsia or PCOS: no Is patient over 955, have PCOS,  family history of premature CHD under age 24, diabetes, smoke, have hypertension or peripheral artery disease:  no At any time, has a partner hit, kicked or otherwise hurt or frightened you?: no Over the past 2 weeks, have you felt down, depressed or hopeless?: no Over the past 2 weeks, have you felt little interest or pleasure in doing things?:no   Gynecologic History Patient's last menstrual period was 09/27/2017 (approximate). Contraception: Depo-Provera injections Last Pap: unknown. Results were: normal Last mammogram: n/a. Results were: n/a  Obstetric History OB History  Gravida Para Term Preterm AB Living  3 3 3  0 0 3  SAB TAB Ectopic Multiple Live Births  0 0 0 0 3    # Outcome Date GA Lbr Len/2nd Weight Sex Delivery Anes PTL Lv  3 Term 09/27/13 6012w5d 05:33 / 01:25 7 lb 1.2 oz (3.209 kg) F Vag-Spont EPI  LIV  2 Term 07/05/12 3844w0d 98:40 / 00:19 8 lb 2.5 oz (3.7 kg) F Vag-Spont EPI  LIV  1 Term 2009 8667w0d 16:00 8 lb 5 oz (3.771 kg) M Vag-Spont EPI  LIV      Past Medical History:  Diagnosis Date  . Eczema   . Gall stones     Past Surgical History:  Procedure Laterality Date  . gallstones removed       Current Outpatient Medications:  .  medroxyPROGESTERone (DEPO-PROVERA) 150 MG/ML injection, Inject 1 mL (150 mg total) into the muscle every 3 (three) months., Disp: 1 mL, Rfl: 0 .   norethindrone-ethinyl estradiol 1/35 (ORTHO-NOVUM 1/35, 28,) tablet, Take 1 tablet by mouth daily. (Patient not taking: Reported on 10/06/2017), Disp: 1 Package, Rfl: 11 .  triamcinolone (KENALOG) 0.025 % ointment, Apply 1 application topically 2 (two) times daily. (Patient not taking: Reported on 10/06/2017), Disp: 30 g, Rfl: 0 No Known Allergies  Social History   Tobacco Use  . Smoking status: Current Every Day Smoker    Packs/day: 0.25    Years: 2.00    Pack years: 0.50    Types: Cigarettes  . Smokeless tobacco: Never Used  Substance Use Topics  . Alcohol use: No    Family History  Problem Relation Age of Onset  . Diabetes Mother       Review of Systems  Constitutional: negative for fatigue and weight loss Respiratory: negative for cough and wheezing Cardiovascular: negative for chest pain, fatigue and palpitations Gastrointestinal: negative for abdominal pain and change in bowel habits Musculoskeletal:negative for myalgias Neurological: negative for gait problems and tremors Behavioral/Psych: negative for abusive relationship, depression Endocrine: negative for temperature intolerance    Genitourinary:negative for abnormal menstrual periods, genital lesions, hot flashes, sexual problems and vaginal discharge Integument/breast: negative for breast lump, breast tenderness, nipple discharge and skin lesion(s)    Objective:       BP 112/77   Pulse 90   Ht 5' 4.5" (1.638 m)   Wt 140 lb (63.5 kg)  LMP 09/27/2017 (Approximate)   BMI 23.66 kg/m  General:   alert  Skin:   no rash or abnormalities  Lungs:   clear to auscultation bilaterally  Heart:   regular rate and rhythm, S1, S2 normal, no murmur, click, rub or gallop  Breasts:   normal without suspicious masses, skin or nipple changes or axillary nodes  Abdomen:  normal findings: no organomegaly, soft, non-tender and no hernia  Pelvis:  External genitalia: normal general appearance Urinary system: urethral meatus  normal and bladder without fullness, nontender Vaginal: normal without tenderness, induration or masses Cervix: normal appearance Adnexa: normal bimanual exam Uterus: anteverted and non-tender, normal size   Lab Review Urine pregnancy test Labs reviewed yes Radiologic studies reviewed no  50% of 20 min visit spent on counseling and coordination of care.   Assessment:     1. Encounter for routine gynecological examination with Papanicolaou smear of cervix Rx: - Cytology - PAP  2. Screening for STD (sexually transmitted disease) Rx: - Cervicovaginal ancillary only - Hepatitis B surface antigen - Hepatitis C antibody - HIV antibody - RPR  3. Encounter for other general counseling and advice on contraception - wants Depo Provera  4. Encounter for initial prescription of injectable contraceptive Rx: - medroxyPROGESTERone (DEPO-PROVERA) 150 MG/ML injection; Inject 1 mL (150 mg total) into the muscle every 3 (three) months.  Dispense: 1 mL; Refill: 0 - POCT urine pregnancy:  Negative   Plan:    Education reviewed: calcium supplements, depression evaluation, low fat, low cholesterol diet, safe sex/STD prevention, self breast exams, smoking cessation and weight bearing exercise. Contraception: Depo-Provera injections. Follow up in: 1 year.   Meds ordered this encounter  Medications  . medroxyPROGESTERone (DEPO-PROVERA) 150 MG/ML injection    Sig: Inject 1 mL (150 mg total) into the muscle every 3 (three) months.    Dispense:  1 mL    Refill:  0   Orders Placed This Encounter  Procedures  . Hepatitis B surface antigen  . Hepatitis C antibody  . HIV antibody  . RPR

## 2017-10-07 ENCOUNTER — Other Ambulatory Visit: Payer: Self-pay | Admitting: Obstetrics

## 2017-10-07 DIAGNOSIS — N760 Acute vaginitis: Secondary | ICD-10-CM

## 2017-10-07 DIAGNOSIS — A749 Chlamydial infection, unspecified: Secondary | ICD-10-CM

## 2017-10-07 DIAGNOSIS — B9689 Other specified bacterial agents as the cause of diseases classified elsewhere: Secondary | ICD-10-CM

## 2017-10-07 LAB — CERVICOVAGINAL ANCILLARY ONLY
BACTERIAL VAGINITIS: POSITIVE — AB
CANDIDA VAGINITIS: NEGATIVE
Chlamydia: POSITIVE — AB
Neisseria Gonorrhea: NEGATIVE
TRICH (WINDOWPATH): NEGATIVE

## 2017-10-07 LAB — CYTOLOGY - PAP: Diagnosis: NEGATIVE

## 2017-10-07 MED ORDER — CEFIXIME 400 MG PO CAPS: 400.0000 mg | ORAL_CAPSULE | Freq: Once | ORAL | 0 refills | Status: AC

## 2017-10-07 MED ORDER — AZITHROMYCIN 500 MG PO TABS: 1000.0000 mg | ORAL_TABLET | Freq: Once | ORAL | 0 refills | Status: AC

## 2017-10-07 MED ORDER — SECNIDAZOLE 2 G PO PACK: 1.0000 | PACK | Freq: Once | ORAL | 2 refills | Status: AC

## 2017-11-24 ENCOUNTER — Telehealth: Payer: Self-pay

## 2017-11-24 MED ORDER — CEFIXIME 400 MG PO CAPS: 400.0000 mg | ORAL_CAPSULE | Freq: Every day | ORAL | 0 refills | Status: DC

## 2017-11-24 MED ORDER — AZITHROMYCIN 250 MG PO TABS: 500.0000 mg | ORAL_TABLET | Freq: Once | ORAL | 0 refills | Status: AC

## 2017-11-24 NOTE — Telephone Encounter (Signed)
Returned call and left detail message with pt approval, advising that provider would re-send medication.

## 2017-11-24 NOTE — Telephone Encounter (Signed)
Returned call, no answer, left vm 

## 2017-12-13 ENCOUNTER — Ambulatory Visit (HOSPITAL_COMMUNITY)
Admission: EM | Admit: 2017-12-13 | Discharge: 2017-12-13 | Disposition: A | Discharge status: Home / Self Care | Payer: Medicaid Other | Attending: Family Medicine | Admitting: Family Medicine

## 2017-12-13 ENCOUNTER — Encounter (HOSPITAL_COMMUNITY): Payer: Self-pay | Admitting: Family Medicine

## 2017-12-13 DIAGNOSIS — J310 Chronic rhinitis: Secondary | ICD-10-CM | POA: Diagnosis not present

## 2017-12-13 MED ORDER — IPRATROPIUM BROMIDE 0.03 % NA SOLN: 2.0000 | Freq: Two times a day (BID) | NASAL | 0 refills | Status: DC

## 2017-12-13 NOTE — Discharge Instructions (Signed)
Use Afrin (oxymetazoline) over-the-counter nasal spray twice a day for 3 days. During this time, start the Atrovent twice a day after you applied the Afrin nasal spray.

## 2017-12-13 NOTE — ED Triage Notes (Signed)
Pt here for nasal congestion, sneezing, headache since Friday.

## 2017-12-13 NOTE — ED Provider Notes (Signed)
[facility]   [id number] 12/13/17 Arrival Time: 1426   SUBJECTIVE:  Courtney [name] is a 27 y.o. female who presents to the urgent care with complaint of nasal congestion, sneezing, headache since Friday.   The nasal passages are blocked.  No fever, sore throat, cough or epistaxis.     Past Medical History:  Diagnosis Date  . Eczema   . Gall stones    Family History  Problem Relation Age of Onset  . Diabetes Mother    Social History   Socioeconomic History  . Marital status: Single    Spouse name: Not on file  . Number of children: 2  . Years of education: Not on file  . Highest education level: Not on file  Social Needs  . Financial resource strain: Not on file  . Food insecurity - worry: Not on file  . Food insecurity - inability: Not on file  . Transportation needs - medical: Not on file  . Transportation needs - non-medical: Not on file  Occupational History  . Not on file  Tobacco Use  . Smoking status: Current Every Day Smoker    Packs/day: 0.25    Years: 2.00    Pack years: 0.50    Types: Cigarettes  . Smokeless tobacco: Never Used  Substance and Sexual Activity  . Alcohol use: No  . Drug use: No  . Sexual activity: Yes    Partners: Male    Birth control/protection: None  Other Topics Concern  . Not on file  Social History Narrative  . Not on file   No outpatient medications have been marked as taking for the 12/13/17 encounter [facility](Hospital Encounter).   No Known Allergies    ROS: As per HPI, remainder of ROS negative.   OBJECTIVE:   Vitals:   12/13/17 1454  BP: 126/73  Pulse: (!) 104  Resp: 18  Temp: 98.9 F (37.2 C)  TempSrc: Oral  SpO2: 99%     General appearance: alert; no distress Eyes: PERRL; EOMI; conjunctiva normal HENT: normocephalic; atraumatic; TMs normal, canal normal, external ears normal without trauma; nasal mucosa red and swollen; oral mucosa normal Neck: supple Back: no CVA tenderness Extremities: no  cyanosis or edema; symmetrical with no gross deformities Skin: warm and dry Neurologic: normal gait; grossly normal Psychological: alert and cooperative; normal mood and affect      Labs:  Results for orders placed or performed in visit on 10/06/17  POCT urine pregnancy  Result Value Ref Range   Preg Test, Ur Negative Negative  Cytology - PAP  Result Value Ref Range   Adequacy      Satisfactory for evaluation  endocervical/transformation zone component PRESENT.   Diagnosis      NEGATIVE FOR INTRAEPITHELIAL LESIONS OR MALIGNANCY.   Material Submitted CervicoVaginal Pap [ThinPrep Imaged]   Cervicovaginal ancillary only  Result Value Ref Range   Bacterial vaginitis **POSITIVE for Gardnerella vaginalis** (A)    Candida vaginitis Negative for Candida species    Chlamydia **POSITIVE** (A)    Neisseria gonorrhea Negative    Trichomonas Negative     Labs Reviewed - No data to display  No results found.     ASSESSMENT & PLAN:  1. Other rhinitis     Meds ordered this encounter  Medications  . ipratropium (ATROVENT) 0.03 % nasal spray    Sig: Place 2 sprays into both nostrils 2 (two) times daily.    Dispense:  30 mL    Refill:  0  Reviewed expectations re: course of current medical issues. Questions answered. Outlined signs and symptoms indicating need for more acute intervention. Patient verbalized understanding. After Visit Summary given.    Procedures:      Elvina Sidle, MD 12/13/17 1523

## 2017-12-26 ENCOUNTER — Ambulatory Visit: Payer: Medicaid Other

## 2018-01-03 ENCOUNTER — Other Ambulatory Visit: Payer: Self-pay | Admitting: Obstetrics

## 2018-01-03 DIAGNOSIS — Z30013 Encounter for initial prescription of injectable contraceptive: Secondary | ICD-10-CM

## 2018-01-03 NOTE — Telephone Encounter (Signed)
Refill sent to [facility].  

## 2018-01-04 ENCOUNTER — Ambulatory Visit (INDEPENDENT_AMBULATORY_CARE_PROVIDER_SITE_OTHER): Payer: Medicaid Other | Admitting: *Deleted

## 2018-01-04 VITALS — BP 109/73 | HR 84 | Wt 154.0 lb

## 2018-01-04 DIAGNOSIS — Z3042 Encounter for surveillance of injectable contraceptive: Secondary | ICD-10-CM | POA: Diagnosis not present

## 2018-01-04 MED ORDER — MEDROXYPROGESTERONE ACETATE 150 MG/ML IM SUSP
150.0000 mg | Freq: Once | INTRAMUSCULAR | Status: AC
  Administered 2018-01-04: 150 mg via INTRAMUSCULAR

## 2018-01-04 NOTE — Progress Notes (Signed)
Pt in is office for depo injection today.  Pt is on time for injection. Pt supplied depo for today's visit.  Pt tolerated injection well.  Pt has no other concerns today. Pt advise to RTO on June 18,2019 for next injection.   Administrations This Visit    medroxyPROGESTERone (DEPO-PROVERA) injection 150 mg    Admin Date 01/04/2018 Action Given Dose 150 mg Route Intramuscular Administered By Lanney GinsFoster, Marjoria Mancillas D, CMA

## 2018-05-04 ENCOUNTER — Ambulatory Visit: Payer: Medicaid Other | Admitting: Obstetrics

## 2018-05-10 ENCOUNTER — Encounter: Payer: Self-pay | Admitting: *Deleted

## 2018-05-10 ENCOUNTER — Ambulatory Visit: Payer: Medicaid Other | Admitting: Obstetrics

## 2018-05-10 ENCOUNTER — Encounter: Payer: Self-pay | Admitting: Obstetrics

## 2018-05-10 ENCOUNTER — Other Ambulatory Visit (HOSPITAL_COMMUNITY)
Admission: RE | Admit: 2018-05-10 | Discharge: 2018-05-10 | Disposition: A | Discharge status: Home / Self Care | Payer: Medicaid Other | Source: Ambulatory Visit | Attending: Obstetrics | Admitting: Obstetrics

## 2018-05-10 VITALS — BP 117/76 | HR 91 | Wt 146.4 lb

## 2018-05-10 DIAGNOSIS — F17210 Nicotine dependence, cigarettes, uncomplicated: Secondary | ICD-10-CM | POA: Diagnosis not present

## 2018-05-10 DIAGNOSIS — Z113 Encounter for screening for infections with a predominantly sexual mode of transmission: Secondary | ICD-10-CM

## 2018-05-10 DIAGNOSIS — Z3042 Encounter for surveillance of injectable contraceptive: Secondary | ICD-10-CM | POA: Diagnosis not present

## 2018-05-10 DIAGNOSIS — Z3202 Encounter for pregnancy test, result negative: Secondary | ICD-10-CM | POA: Diagnosis not present

## 2018-05-10 DIAGNOSIS — N898 Other specified noninflammatory disorders of vagina: Secondary | ICD-10-CM

## 2018-05-10 DIAGNOSIS — Z3200 Encounter for pregnancy test, result unknown: Secondary | ICD-10-CM

## 2018-05-10 LAB — POCT URINE PREGNANCY: PREG TEST UR: NEGATIVE

## 2018-05-10 NOTE — Progress Notes (Signed)
Patient ID: Courtney [name], female   DOB: 09-Sep-1991, 27 y.o.   MRN: [id number]  No chief complaint on file.   HPI Courtney Bangyliah Ambrosini is a 27 y.o. female.  Vaginal discharge with odor.  No period since March.  On Depo Provera Injections for contraception.  Last Depo shot was in March 2019.  LMP Feb 22, 2018.  Negative subjective signs of pregnancy. HPI  Past Medical History:  Diagnosis Date  . Eczema   . Gall stones     Past Surgical History:  Procedure Laterality Date  . gallstones removed      Family History  Problem Relation Age of Onset  . Diabetes Mother     Social History Social History   Tobacco Use  . Smoking status: Current Every Day Smoker    Packs/day: 0.25    Years: 2.00    Pack years: 0.50    Types: Cigarettes  . Smokeless tobacco: Never Used  Substance Use Topics  . Alcohol use: No  . Drug use: No    No Known Allergies  Current Outpatient Medications  Medication Sig Dispense Refill  . ipratropium (ATROVENT) 0.03 % nasal spray Place 2 sprays into both nostrils 2 (two) times daily. 30 mL 0  . medroxyPROGESTERone (DEPO-PROVERA) 150 MG/ML injection ADMINISTER 1 ML(150 MG) IN THE MUSCLE EVERY 3 MONTHS 1 mL 3   No current facility-administered medications for this visit.     Review of Systems Review of Systems Constitutional: negative for fatigue and weight loss Respiratory: negative for cough and wheezing Cardiovascular: negative for chest pain, fatigue and palpitations Gastrointestinal: negative for abdominal pain and change in bowel habits Genitourinary: Malodorous vaginal discharge Integument/breast: negative for nipple discharge Musculoskeletal:negative for myalgias Neurological: negative for gait problems and tremors Behavioral/Psych: negative for abusive relationship, depression Endocrine: negative for temperature intolerance      Blood pressure 117/76, pulse 91, weight 146 lb 6.4 oz (66.4 kg).  Physical Exam Physical Exam           General:   Alert and no distress Abdomen:  normal findings: no organomegaly, soft, non-tender and no hernia  Pelvis:  External genitalia: normal general appearance Urinary system: urethral meatus normal and bladder without fullness, nontender Vaginal: normal without tenderness, induration or masses Cervix: normal appearance Adnexa: normal bimanual exam Uterus: anteverted and non-tender, normal size    50% of 15 min visit spent on counseling and coordination of care.   Data Reviewed UPT:  Negative Wet Prep and Cultures  Assessment and Plan:    1. Possible pregnancy Rx: - POCT urine pregnancy  2. Vaginal discharge Rx: - Cervicovaginal ancillary only  3. Screening for STD (sexually transmitted disease)  4. Encounter for surveillance of injectable contraceptive - does not want to continue Depo - declines contraception  5. Tobacco dependence due to cigarettes - tobacco cessation recommended    Plan    FOLLOW UP IN 5 MONTHS FOR ANNUAL  Orders Placed This Encounter  Procedures  . POCT urine pregnancy   No orders of the defined types were placed in this encounter.   Brock BadHARLES A. Kyran Whittier MD 05-10-2018

## 2018-05-12 ENCOUNTER — Other Ambulatory Visit: Payer: Self-pay | Admitting: Obstetrics

## 2018-05-12 DIAGNOSIS — B373 Candidiasis of vulva and vagina: Secondary | ICD-10-CM

## 2018-05-12 DIAGNOSIS — N760 Acute vaginitis: Principal | ICD-10-CM

## 2018-05-12 DIAGNOSIS — B3731 Acute candidiasis of vulva and vagina: Secondary | ICD-10-CM

## 2018-05-12 DIAGNOSIS — B9689 Other specified bacterial agents as the cause of diseases classified elsewhere: Secondary | ICD-10-CM

## 2018-05-12 LAB — CERVICOVAGINAL ANCILLARY ONLY
Bacterial vaginitis: POSITIVE — AB
CHLAMYDIA, DNA PROBE: NEGATIVE
Candida vaginitis: POSITIVE — AB
Neisseria Gonorrhea: NEGATIVE
Trichomonas: NEGATIVE

## 2018-05-12 MED ORDER — SECNIDAZOLE 2 G PO PACK: 1.0000 | PACK | Freq: Once | ORAL | 2 refills | Status: AC

## 2018-05-12 MED ORDER — FLUCONAZOLE 150 MG PO TABS: 150.0000 mg | ORAL_TABLET | Freq: Once | ORAL | 0 refills | Status: AC

## 2018-07-05 ENCOUNTER — Ambulatory Visit (INDEPENDENT_AMBULATORY_CARE_PROVIDER_SITE_OTHER): Payer: Medicaid Other

## 2018-07-05 DIAGNOSIS — Z3202 Encounter for pregnancy test, result negative: Secondary | ICD-10-CM

## 2018-07-05 DIAGNOSIS — Z3200 Encounter for pregnancy test, result unknown: Secondary | ICD-10-CM

## 2018-07-05 LAB — POCT URINE PREGNANCY: PREG TEST UR: NEGATIVE

## 2018-07-05 NOTE — Progress Notes (Signed)
Patient in the office for UPT, test negative. Pt states that she has not had cycle since May.  Informed patient that last depo injection was 01-04-18 and that cycle can take some time to come back.  Pt wants to be on depo again, advised to schedule appt for depo re-start. Last pap 10-06-17.

## 2018-07-30 ENCOUNTER — Other Ambulatory Visit: Payer: Self-pay

## 2018-07-30 ENCOUNTER — Encounter (HOSPITAL_COMMUNITY): Payer: Self-pay

## 2018-07-30 ENCOUNTER — Inpatient Hospital Stay (HOSPITAL_COMMUNITY)
Admission: AD | Admit: 2018-07-30 | Discharge: 2018-07-30 | Disposition: A | Discharge status: Home / Self Care | Payer: Medicaid Other | Source: Ambulatory Visit | Attending: Obstetrics & Gynecology | Admitting: Obstetrics & Gynecology

## 2018-07-30 DIAGNOSIS — F17210 Nicotine dependence, cigarettes, uncomplicated: Secondary | ICD-10-CM | POA: Insufficient documentation

## 2018-07-30 DIAGNOSIS — A084 Viral intestinal infection, unspecified: Secondary | ICD-10-CM | POA: Diagnosis not present

## 2018-07-30 DIAGNOSIS — Z3202 Encounter for pregnancy test, result negative: Secondary | ICD-10-CM | POA: Insufficient documentation

## 2018-07-30 DIAGNOSIS — N939 Abnormal uterine and vaginal bleeding, unspecified: Secondary | ICD-10-CM | POA: Diagnosis not present

## 2018-07-30 LAB — URINALYSIS, ROUTINE W REFLEX MICROSCOPIC
BILIRUBIN URINE: NEGATIVE
GLUCOSE, UA: NEGATIVE mg/dL
HGB URINE DIPSTICK: NEGATIVE
KETONES UR: NEGATIVE mg/dL
Leukocytes, UA: NEGATIVE
Nitrite: NEGATIVE
PROTEIN: NEGATIVE mg/dL
Specific Gravity, Urine: 1.021 (ref 1.005–1.030)
pH: 5 (ref 5.0–8.0)

## 2018-07-30 LAB — POCT PREGNANCY, URINE: Preg Test, Ur: NEGATIVE

## 2018-07-30 MED ORDER — ONDANSETRON 4 MG PO TBDP: 4.0000 mg | ORAL_TABLET | Freq: Three times a day (TID) | ORAL | 0 refills | Status: DC | PRN

## 2018-07-30 MED ORDER — ONDANSETRON 8 MG PO TBDP
8.0000 mg | ORAL_TABLET | Freq: Once | ORAL | Status: AC
Start: 2018-07-30 — End: 2018-07-30
  Administered 2018-07-30: 8 mg via ORAL
  Filled 2018-07-30: qty 1

## 2018-07-30 NOTE — Discharge Instructions (Signed)

## 2018-07-30 NOTE — MAU Note (Signed)
Diarrhea since last night, 5 times in 24 hours  Vomitting started last night, a total of 6 times in 24 hours  Vaginal bleeding started last night and reports passing many small blood clots  LMP in May  Lower abdominal pain for 2 weeks, 8/10, cramping, intermittent

## 2018-07-30 NOTE — MAU Provider Note (Signed)
Chief Complaint: Abdominal Pain; Vaginal Bleeding; Emesis; and Diarrhea   First Provider Initiated Contact with Patient 07/30/18 1904     SUBJECTIVE HPI: Courtney [name] is a 27 y.o. non pregnant female who presents to Maternity Admissions reporting n/v/d, vaginal bleeding, and abdominal cramping.  Previously on depo provera. Last injection may 2019. Was supposed to restart earlier this month (at [facility]) but no showed her appt. States she couldn't go b/c her kids didn't have school that day.  Reports not vaginal bleeding since May until yesterday. Has had intermittent spotting since yesterday.  Reports lower abdominal cramping.  N/v/d since yesterday. No sick contacts. Has vomited 4x & had 6 episodes of watery stool. Denies fever. No blood in stool. Hasn't treated symptoms.   Location: lower abdomen Quality: cramping Severity: 7/10 on pain scale Duration: 1 day Timing: intermittent Modifying factors: none Associated signs and symptoms: vaginal bleeding & n/v/d  Past Medical History:  Diagnosis Date  . Eczema   . Gall stones    OB History  Gravida Para Term Preterm AB Living  3 3 3  0 0 3  SAB TAB Ectopic Multiple Live Births  0 0 0 0 3    # Outcome Date GA Lbr Len/2nd Weight Sex Delivery Anes PTL Lv  3 Term 09/27/13 [redacted]w[redacted]d 05:33 / 01:25 3209 g F Vag-Spont EPI  LIV  2 Term 07/05/12 [redacted]w[redacted]d 98:40 / 00:19 3700 g F Vag-Spont EPI  LIV  1 Term 2009 [redacted]w[redacted]d 16:00 3771 g M Vag-Spont EPI  LIV   Past Surgical History:  Procedure Laterality Date  . gallstones removed     Social History   Socioeconomic History  . Marital status: Single    Spouse name: Not on file  . Number of children: 2  . Years of education: Not on file  . Highest education level: Not on file  Occupational History  . Not on file  Social Needs  . Financial resource strain: Not on file  . Food insecurity:    Worry: Not on file    Inability: Not on file  . Transportation needs:    Medical: Not on file   Non-medical: Not on file  Tobacco Use  . Smoking status: Current Every Day Smoker    Packs/day: 0.25    Years: 2.00    Pack years: 0.50    Types: Cigarettes  . Smokeless tobacco: Never Used  Substance and Sexual Activity  . Alcohol use: No  . Drug use: No  . Sexual activity: Yes    Partners: Male    Birth control/protection: None  Lifestyle  . Physical activity:    Days per week: Not on file    Minutes per session: Not on file  . Stress: Not on file  Relationships  . Social connections:    Talks on phone: Not on file    Gets together: Not on file    Attends religious service: Not on file    Active member of club or organization: Not on file    Attends meetings of clubs or organizations: Not on file    Relationship status: Not on file  . Intimate partner violence:    Fear of current or ex partner: Not on file    Emotionally abused: Not on file    Physically abused: Not on file    Forced sexual activity: Not on file  Other Topics Concern  . Not on file  Social History Narrative  . Not on file   Family History  Problem Relation Age of Onset  . Diabetes Mother    No current facility-administered medications on file prior to encounter.    Current Outpatient Medications on File Prior to Encounter  Medication Sig Dispense Refill  . ipratropium (ATROVENT) 0.03 % nasal spray Place 2 sprays into both nostrils 2 (two) times daily. 30 mL 0   No Known Allergies  I have reviewed patient's Past Medical Hx, Surgical Hx, Family Hx, Social Hx, medications and allergies.   Review of Systems  Constitutional: Negative for chills and fever.  Gastrointestinal: Positive for abdominal pain, diarrhea, nausea and vomiting. Negative for blood in stool.  Genitourinary: Positive for vaginal bleeding. Negative for vaginal discharge.    OBJECTIVE Patient Vitals for the past 24 hrs:  BP Temp Temp src Pulse Resp SpO2 Weight  07/30/18 1921 103/62 98.3 F (36.8 C) - 76 16 100 % -  07/30/18  1832 108/74 98.1 F (36.7 C) Oral 79 18 - 62.3 kg   Constitutional: Well-developed, well-nourished female in no acute distress.  Cardiovascular: normal rate & rhythm, no murmur Respiratory: normal rate and effort. Lung sounds clear throughout GI: Abd soft, non-tender, Pos BS x 4. No guarding or rebound tenderness MS: Extremities nontender, no edema, normal ROM Neurologic: Alert and oriented x 4.  GU: No blood   LAB RESULTS Results for orders placed or performed during the hospital encounter of 07/30/18 (from the past 24 hour(s))  Urinalysis, Routine w reflex microscopic     Status: Abnormal   Collection Time: 07/30/18  6:41 PM  Result Value Ref Range   Color, Urine YELLOW YELLOW   APPearance HAZY (A) CLEAR   Specific Gravity, Urine 1.021 1.005 - 1.030   pH 5.0 5.0 - 8.0   Glucose, UA NEGATIVE NEGATIVE mg/dL   Hgb urine dipstick NEGATIVE NEGATIVE   Bilirubin Urine NEGATIVE NEGATIVE   Ketones, ur NEGATIVE NEGATIVE mg/dL   Protein, ur NEGATIVE NEGATIVE mg/dL   Nitrite NEGATIVE NEGATIVE   Leukocytes, UA NEGATIVE NEGATIVE  Pregnancy, urine POC     Status: None   Collection Time: 07/30/18  6:42 PM  Result Value Ref Range   Preg Test, Ur NEGATIVE NEGATIVE    IMAGING No results found.  MAU COURSE Orders Placed This Encounter  Procedures  . Urinalysis, Routine w reflex microscopic  . Contact Isolation: Enteric  . Pregnancy, urine POC  . Discharge patient   Meds ordered this encounter  Medications  . ondansetron (ZOFRAN-ODT) disintegrating tablet 8 mg  . ondansetron (ZOFRAN ODT) 4 MG disintegrating tablet    Sig: Take 1 tablet (4 mg total) by mouth every 8 (eight) hours as needed for nausea or vomiting.    Dispense:  15 tablet    Refill:  0    Order Specific Question:   Supervising Provider    Answer:   Willodean Rosenthal [[id number]]    MDM UPT negative Declines pelvic swabs No vomiting or diarrhea in MAU U/a shows no evidence of dehydration or infection Zofran given  in MAU  ASSESSMENT 1. Viral gastroenteritis   2. Pregnancy examination or test, negative result     PLAN Discharge home in stable condition. Bland diet for diarrhea Work note given F/u with [facility] to schedule depo  Follow-up Information    [facility]. Schedule an appointment as soon as possible for a visit.   Why:  to restart depo injections Contact information: [address] [place] [address] [phone]  Allergies as of 07/30/2018   No Known Allergies     Medication List    STOP taking these medications   medroxyPROGESTERone 150 MG/ML injection Commonly known as:  DEPO-PROVERA     TAKE these medications   ipratropium 0.03 % nasal spray Commonly known as:  ATROVENT Place 2 sprays into both nostrils 2 (two) times daily.   ondansetron 4 MG disintegrating tablet Commonly known as:  ZOFRAN-ODT Take 1 tablet (4 mg total) by mouth every 8 (eight) hours as needed for nausea or vomiting.        Judeth Horn, NP 07/30/2018  8:22 PM

## 2018-08-01 ENCOUNTER — Other Ambulatory Visit: Payer: Self-pay

## 2018-08-01 ENCOUNTER — Inpatient Hospital Stay (HOSPITAL_COMMUNITY)
Admission: AD | Admit: 2018-08-01 | Discharge: 2018-08-01 | Disposition: A | Discharge status: Home / Self Care | Payer: Medicaid Other | Source: Ambulatory Visit | Attending: Obstetrics and Gynecology | Admitting: Obstetrics and Gynecology

## 2018-08-01 ENCOUNTER — Encounter (HOSPITAL_COMMUNITY): Payer: Self-pay | Admitting: *Deleted

## 2018-08-01 DIAGNOSIS — Z79899 Other long term (current) drug therapy: Secondary | ICD-10-CM | POA: Insufficient documentation

## 2018-08-01 DIAGNOSIS — A084 Viral intestinal infection, unspecified: Secondary | ICD-10-CM | POA: Diagnosis not present

## 2018-08-01 DIAGNOSIS — R112 Nausea with vomiting, unspecified: Secondary | ICD-10-CM | POA: Diagnosis present

## 2018-08-01 DIAGNOSIS — F17210 Nicotine dependence, cigarettes, uncomplicated: Secondary | ICD-10-CM | POA: Diagnosis not present

## 2018-08-01 LAB — URINALYSIS, ROUTINE W REFLEX MICROSCOPIC
Bilirubin Urine: NEGATIVE
GLUCOSE, UA: NEGATIVE mg/dL
Hgb urine dipstick: NEGATIVE
KETONES UR: NEGATIVE mg/dL
LEUKOCYTES UA: NEGATIVE
Nitrite: NEGATIVE
Protein, ur: NEGATIVE mg/dL
Specific Gravity, Urine: 1.018 (ref 1.005–1.030)
pH: 6 (ref 5.0–8.0)

## 2018-08-01 MED ORDER — PROMETHAZINE HCL 25 MG PO TABS: 25.0000 mg | ORAL_TABLET | Freq: Four times a day (QID) | ORAL | 0 refills | Status: DC | PRN

## 2018-08-01 NOTE — MAU Provider Note (Signed)
Chief Complaint: Emesis and Diarrhea   First Provider Initiated Contact with Patient 08/01/18 1424     SUBJECTIVE HPI: Courtney [name] is a 27 y.o. non pregnant female who presents to [facility] reporting n/v/d. Was seen in MAU 2 days ago for same complaint. Was discharged home with rx zofran. States when she doesn't take the zofran she still vomits. Has vomited twice this morning & had 4 episodes of diarrhea. Last took zofran yesterday. Denies fever/chills or abdominal pain. Has been able to keep down water, juice, & noodles.   Past Medical History:  Diagnosis Date  . Eczema   . Gall stones    OB History  Gravida Para Term Preterm AB Living  3 3 3  0 0 3  SAB TAB Ectopic Multiple Live Births  0 0 0 0 3    # Outcome Date GA Lbr Len/2nd Weight Sex Delivery Anes PTL Lv  3 Term 09/27/13 [redacted]w[redacted]d 05:33 / 01:25 3209 g F Vag-Spont EPI  LIV  2 Term 07/05/12 [redacted]w[redacted]d 98:40 / 00:19 3700 g F Vag-Spont EPI  LIV  1 Term 2009 [redacted]w[redacted]d 16:00 3771 g M Vag-Spont EPI  LIV   Past Surgical History:  Procedure Laterality Date  . gallstones removed     Social History   Socioeconomic History  . Marital status: Single    Spouse name: Not on file  . Number of children: 2  . Years of education: Not on file  . Highest education level: Not on file  Occupational History  . Not on file  Social Needs  . Financial resource strain: Not on file  . Food insecurity:    Worry: Not on file    Inability: Not on file  . Transportation needs:    Medical: Not on file    Non-medical: Not on file  Tobacco Use  . Smoking status: Current Every Day Smoker    Packs/day: 0.25    Years: 2.00    Pack years: 0.50    Types: Cigarettes  . Smokeless tobacco: Never Used  Substance and Sexual Activity  . Alcohol use: No  . Drug use: No  . Sexual activity: Yes    Partners: Male    Birth control/protection: Condom  Lifestyle  . Physical activity:    Days per week: Not on file    Minutes per session: Not on file   . Stress: Not on file  Relationships  . Social connections:    Talks on phone: Not on file    Gets together: Not on file    Attends religious service: Not on file    Active member of club or organization: Not on file    Attends meetings of clubs or organizations: Not on file    Relationship status: Not on file  . Intimate partner violence:    Fear of current or ex partner: Not on file    Emotionally abused: Not on file    Physically abused: Not on file    Forced sexual activity: Not on file  Other Topics Concern  . Not on file  Social History Narrative  . Not on file   Family History  Problem Relation Age of Onset  . Diabetes Mother    No current facility-administered medications on file prior to encounter.    Current Outpatient Medications on File Prior to Encounter  Medication Sig Dispense Refill  . ipratropium (ATROVENT) 0.03 % nasal spray Place 2 sprays into both nostrils 2 (two) times daily. 30 mL 0  .  ondansetron (ZOFRAN ODT) 4 MG disintegrating tablet Take 1 tablet (4 mg total) by mouth every 8 (eight) hours as needed for nausea or vomiting. 15 tablet 0  . promethazine (PHENERGAN) 25 MG tablet Take 1 tablet (25 mg total) by mouth every 6 (six) hours as needed for nausea. 30 tablet 0  . promethazine (PHENERGAN) 25 MG tablet Take 1 tablet (25 mg total) by mouth every 6 (six) hours as needed for nausea. 30 tablet 0  . SOLOSEC 2 g PACK   2   No Known Allergies  I have reviewed patient's Past Medical Hx, Surgical Hx, Family Hx, Social Hx, medications and allergies.   Review of Systems  Constitutional: Negative.   Gastrointestinal: Positive for diarrhea, nausea and vomiting. Negative for abdominal pain and blood in stool.    OBJECTIVE Patient Vitals for the past 24 hrs:  BP Temp Temp src Pulse Resp SpO2 Weight  08/01/18 1444 109/72 - - 75 16 100 % -  08/01/18 1335 92/67 98.1 F (36.7 C) Oral 97 16 98 % 61.1 kg   Constitutional: Well-developed, well-nourished female  in no acute distress.  Cardiovascular: normal rate & rhythm, no murmur Respiratory: normal rate and effort. Lung sounds clear throughout GI: Abd soft, non-tender, Pos BS x 4. No guarding or rebound tenderness MS: Extremities nontender, no edema, normal ROM Neurologic: Alert and oriented x 4.    LAB RESULTS Results for orders placed or performed during the hospital encounter of 08/01/18 (from the past 24 hour(s))  Urinalysis, Routine w reflex microscopic     Status: None   Collection Time: 08/01/18  1:42 PM  Result Value Ref Range   Color, Urine YELLOW YELLOW   APPearance CLEAR CLEAR   Specific Gravity, Urine 1.018 1.005 - 1.030   pH 6.0 5.0 - 8.0   Glucose, UA NEGATIVE NEGATIVE mg/dL   Hgb urine dipstick NEGATIVE NEGATIVE   Bilirubin Urine NEGATIVE NEGATIVE   Ketones, ur NEGATIVE NEGATIVE mg/dL   Protein, ur NEGATIVE NEGATIVE mg/dL   Nitrite NEGATIVE NEGATIVE   Leukocytes, UA NEGATIVE NEGATIVE    IMAGING No results found.  MAU COURSE Orders Placed This Encounter  Procedures  . Urinalysis, Routine w reflex microscopic  . Discharge patient   Meds ordered this encounter  Medications  . promethazine (PHENERGAN) 25 MG tablet    Sig: Take 1 tablet (25 mg total) by mouth every 6 (six) hours as needed for nausea or vomiting.    Dispense:  30 tablet    Refill:  0    Order Specific Question:   Supervising Provider    Answer:   ERVIN, MICHAEL L [[id number]]    MDM VSS, NAD. No vomiting or diarrhea in MAU. Declines meds in MAU.   ASSESSMENT 1. Viral gastroenteritis     PLAN Discharge home in stable condition. Rx phenergan Work note given If symptoms worsen, go to ED  Allergies as of 08/01/2018   No Known Allergies     Medication List    TAKE these medications   ipratropium 0.03 % nasal spray Commonly known as:  ATROVENT Place 2 sprays into both nostrils 2 (two) times daily.   ondansetron 4 MG disintegrating tablet Commonly known as:  ZOFRAN-ODT Take 1 tablet (4 mg  total) by mouth every 8 (eight) hours as needed for nausea or vomiting.   SOLOSEC 2 g Pack Generic drug:  Secnidazole     ASK your doctor about these medications   promethazine 25 MG tablet Commonly known as:  PHENERGAN Take 1 tablet (25 mg total) by mouth every 6 (six) hours as needed for nausea. Ask about: Which instructions should I use?   promethazine 25 MG tablet Commonly known as:  PHENERGAN Take 1 tablet (25 mg total) by mouth every 6 (six) hours as needed for nausea. Ask about: Which instructions should I use?   promethazine 25 MG tablet Commonly known as:  PHENERGAN Take 1 tablet (25 mg total) by mouth every 6 (six) hours as needed for nausea or vomiting. Ask about: Which instructions should I use?        Judeth Horn, NP 08/01/2018  9:23 PM

## 2018-08-01 NOTE — Discharge Instructions (Signed)
Viral Gastroenteritis, Adult  Viral gastroenteritis is also known as the stomach flu. This condition is caused by various viruses. These viruses can be passed from person to person very easily (are very contagious). This condition may affect your stomach, small intestine, and large intestine. It can cause sudden watery diarrhea, fever, and vomiting.  Diarrhea and vomiting can make you feel weak and cause you to become dehydrated. You may not be able to keep fluids down. Dehydration can make you tired and thirsty, cause you to have a dry mouth, and decrease how often you urinate. Older adults and people with other diseases or a weak immune system are at higher risk for dehydration.  It is important to replace the fluids that you lose from diarrhea and vomiting. If you become severely dehydrated, you may need to get fluids through an IV tube.  What are the causes?  Gastroenteritis is caused by various viruses, including rotavirus and norovirus. Norovirus is the most common cause in adults.  You can get sick by eating food, drinking water, or touching a surface contaminated with one of these viruses. You can also get sick from sharing utensils or other personal items with an infected person.  What increases the risk?  This condition is more likely to develop in people:  · Who have a weak defense system (immune system).  · Who live with one or more children who are younger than 2 years old.  · Who live in a nursing home.  · Who go on cruise ships.    What are the signs or symptoms?  Symptoms of this condition start suddenly 1-2 days after exposure to a virus. Symptoms may last a few days or as long as a week. The most common symptoms are watery diarrhea and vomiting. Other symptoms include:  · Fever.  · Headache.  · Fatigue.  · Pain in the abdomen.  · Chills.  · Weakness.  · Nausea.  · Muscle aches.  · Loss of appetite.    How is this diagnosed?  This condition is diagnosed with a medical history and physical exam. You  may also have a stool test to check for viruses or other infections.  How is this treated?  This condition typically goes away on its own. The focus of treatment is to restore lost fluids (rehydration). Your health care provider may recommend that you take an oral rehydration solution (ORS) to replace important salts and minerals (electrolytes) in your body. Severe cases of this condition may require giving fluids through an IV tube.  Treatment may also include medicine to help with your symptoms.  Follow these instructions at home:  Follow instructions from your health care provider about how to care for yourself at home.  Eating and drinking  Follow these recommendations as told by your health care provider:  · Take an ORS. This is a drink that is sold at pharmacies and retail stores.  · Drink clear fluids in small amounts as you are able. Clear fluids include water, ice chips, diluted fruit juice, and low-calorie sports drinks.  · Eat bland, easy-to-digest foods in small amounts as you are able. These foods include bananas, applesauce, rice, lean meats, toast, and crackers.  · Avoid fluids that contain a lot of sugar or caffeine, such as energy drinks, sports drinks, and soda.  · Avoid alcohol.  · Avoid spicy or fatty foods.    General instructions    · Drink enough fluid to keep your urine clear or   pale yellow.  · Wash your hands often. If soap and water are not available, use hand sanitizer.  · Make sure that all people in your household wash their hands well and often.  · Take over-the-counter and prescription medicines only as told by your health care provider.  · Rest at home while you recover.  · Watch your condition for any changes.  · Take a warm bath to relieve any burning or pain from frequent diarrhea episodes.  · Keep all follow-up visits as told by your health care provider. This is important.  Contact a health care provider if:  · You cannot keep fluids down.  · Your symptoms get worse.  · You have  new symptoms.  · You feel light-headed or dizzy.  · You have muscle cramps.  Get help right away if:  · You have chest pain.  · You feel extremely weak or you faint.  · You see blood in your vomit.  · Your vomit looks like coffee grounds.  · You have bloody or black stools or stools that look like tar.  · You have a severe headache, a stiff neck, or both.  · You have a rash.  · You have severe pain, cramping, or bloating in your abdomen.  · You have trouble breathing or you are breathing very quickly.  · Your heart is beating very quickly.  · Your skin feels cold and clammy.  · You feel confused.  · You have pain when you urinate.  · You have signs of dehydration, such as:  ? Dark urine, very little urine, or no urine.  ? Cracked lips.  ? Dry mouth.  ? Sunken eyes.  ? Sleepiness.  ? Weakness.  This information is not intended to replace advice given to you by your health care provider. Make sure you discuss any questions you have with your health care provider.  Document Released: 09/27/2005 Document Revised: 03/10/2016 Document Reviewed: 06/03/2015  Elsevier Interactive Patient Education © 2018 [employer]

## 2018-08-01 NOTE — MAU Note (Signed)
Ongoing vomiting and diarrhea. Was given Zofran, when doesn't take it she vomits. (thrown up twice today, diarrhea x4)

## 2018-12-14 ENCOUNTER — Other Ambulatory Visit: Payer: Self-pay

## 2018-12-14 ENCOUNTER — Emergency Department (HOSPITAL_COMMUNITY)
Admission: EM | Admit: 2018-12-14 | Discharge: 2018-12-14 | Disposition: A | Discharge status: Home / Self Care | Payer: Medicaid Other | Attending: Emergency Medicine | Admitting: Emergency Medicine

## 2018-12-14 ENCOUNTER — Encounter (HOSPITAL_COMMUNITY): Payer: Self-pay

## 2018-12-14 DIAGNOSIS — F17210 Nicotine dependence, cigarettes, uncomplicated: Secondary | ICD-10-CM | POA: Diagnosis not present

## 2018-12-14 DIAGNOSIS — R1084 Generalized abdominal pain: Secondary | ICD-10-CM | POA: Insufficient documentation

## 2018-12-14 LAB — CBC WITH DIFFERENTIAL/PLATELET
ABS IMMATURE GRANULOCYTES: 0.04 10*3/uL (ref 0.00–0.07)
Basophils Absolute: 0.1 10*3/uL (ref 0.0–0.1)
Basophils Relative: 1 %
EOS PCT: 1 %
Eosinophils Absolute: 0.1 10*3/uL (ref 0.0–0.5)
HCT: 41.7 % (ref 36.0–46.0)
Hemoglobin: 13.6 g/dL (ref 12.0–15.0)
Immature Granulocytes: 0 %
Lymphocytes Relative: 13 %
Lymphs Abs: 1.6 10*3/uL (ref 0.7–4.0)
MCH: 31.3 pg (ref 26.0–34.0)
MCHC: 32.6 g/dL (ref 30.0–36.0)
MCV: 96.1 fL (ref 80.0–100.0)
MONO ABS: 0.5 10*3/uL (ref 0.1–1.0)
Monocytes Relative: 4 %
NEUTROS ABS: 9.9 10*3/uL — AB (ref 1.7–7.7)
Neutrophils Relative %: 81 %
PLATELETS: 258 10*3/uL (ref 150–400)
RBC: 4.34 MIL/uL (ref 3.87–5.11)
RDW: 12.5 % (ref 11.5–15.5)
WBC: 12.3 10*3/uL — AB (ref 4.0–10.5)
nRBC: 0 % (ref 0.0–0.2)

## 2018-12-14 LAB — COMPREHENSIVE METABOLIC PANEL
ALT: 14 U/L (ref 0–44)
AST: 21 U/L (ref 15–41)
Albumin: 4.5 g/dL (ref 3.5–5.0)
Alkaline Phosphatase: 70 U/L (ref 38–126)
Anion gap: 9 (ref 5–15)
BILIRUBIN TOTAL: 0.8 mg/dL (ref 0.3–1.2)
CO2: 23 mmol/L (ref 22–32)
Calcium: 10 mg/dL (ref 8.9–10.3)
Chloride: 106 mmol/L (ref 98–111)
Creatinine, Ser: 0.72 mg/dL (ref 0.44–1.00)
Glucose, Bld: 80 mg/dL (ref 70–99)
POTASSIUM: 3.6 mmol/L (ref 3.5–5.1)
Sodium: 138 mmol/L (ref 135–145)
TOTAL PROTEIN: 8 g/dL (ref 6.5–8.1)

## 2018-12-14 LAB — URINALYSIS, ROUTINE W REFLEX MICROSCOPIC
Bacteria, UA: NONE SEEN
Bilirubin Urine: NEGATIVE
GLUCOSE, UA: NEGATIVE mg/dL
Ketones, ur: NEGATIVE mg/dL
Leukocytes,Ua: NEGATIVE
Nitrite: NEGATIVE
PH: 6 (ref 5.0–8.0)
Protein, ur: NEGATIVE mg/dL
Specific Gravity, Urine: 1.003 — ABNORMAL LOW (ref 1.005–1.030)

## 2018-12-14 LAB — I-STAT BETA HCG BLOOD, ED (MC, WL, AP ONLY): I-stat hCG, quantitative: <5 m[IU]/mL (ref <5)

## 2018-12-14 LAB — LIPASE, BLOOD: LIPASE: 32 U/L (ref 11–51)

## 2018-12-14 MED ORDER — ALUM & MAG HYDROXIDE-SIMETH 200-200-20 MG/5ML PO SUSP
30.0000 mL | Freq: Once | ORAL | Status: AC
  Administered 2018-12-14: 30 mL via ORAL
  Filled 2018-12-14: qty 30

## 2018-12-14 MED ORDER — LIDOCAINE VISCOUS HCL 2 % MT SOLN
15.0000 mL | Freq: Once | OROMUCOSAL | Status: AC
  Administered 2018-12-14: 15 mL via ORAL
  Filled 2018-12-14: qty 15

## 2018-12-14 NOTE — ED Triage Notes (Signed)
Pt arrives POV from home for intermittent lower abdominal pain x4-5 days. Pt denies current pain. Pt denies N/V/D or urinary s/s. Pt reports last period end of jan./begigning of feb.

## 2018-12-14 NOTE — Discharge Instructions (Signed)
Sure you are staying hydrated and drinking plenty of fluids.  Follow-up with your primary care doctor.  If you do not have a primary care doctor, follow-up with the [facility] wellness [facility].  Return the emergency department for any worsening pain, pain becomes more severe or constant in nature, fever, vomiting or any other worsening or concerning symptoms.

## 2018-12-14 NOTE — ED Notes (Signed)
Pt was given graham crackers and sprite for PO challenge

## 2018-12-14 NOTE — ED Provider Notes (Signed)
[facility] EMERGENCY DEPARTMENT Provider Note   CSN: [id number] Arrival date & time: 12/14/18  1610    History   Chief Complaint Chief Complaint  Patient presents with  . Abdominal Pain    HPI Courtney [name] is a 28 y.o. female with PMH/o Gallstones and eczema who presents for evaluation of intermittent abdominal pain that has been intermittently occurring for the last 4-5 days.  Patient states that her pain occurs randomly and states that there is no specific action that triggers the pain.  She has been able to eat and drink without any difficulty.  She states she has not had any nausea, vomiting, diarrhea.  Her last bowel movement was earlier today and was normal.  Patient states that she has not taken anything for the pain.  He states she came to the emergency department today because she started having pain earlier this afternoon and wanted to get it evaluated.  She states that the pain is since improved since being here in the ED.  She reports her last menstrual cycle was at the end of January.  She reports she is currently sexually active with one partner.  They use condoms.  Denies any fevers, chest pain, difficulty breathing, nausea/vomiting, dysuria, hematuria, vaginal bleeding, vaginal discharge.     The history is provided by the patient.    Past Medical History:  Diagnosis Date  . Eczema   . Gall stones     Patient Active Problem List   Diagnosis Date Noted  . Indication for care in labor or delivery 09/27/2013  . Normal delivery 09/27/2013  . Right orbit fracture 08/10/2013  . Eczema 04/17/2013  . Mild hyperemesis gravidarum, antepartum 02/19/2013  . General counseling for initiation of other contraceptive measures 01/02/2013  . Anemia, iron deficiency 05/30/2012    Past Surgical History:  Procedure Laterality Date  . gallstones removed       OB History    Gravida  3   Para  3   Term  3   Preterm  0   AB  0   Living  3     SAB    0   TAB  0   Ectopic  0   Multiple  0   Live Births  3            Home Medications    Prior to Admission medications   Medication Sig Start Date End Date Taking? Authorizing Provider  ipratropium (ATROVENT) 0.03 % nasal spray Place 2 sprays into both nostrils 2 (two) times daily. Patient not taking: Reported on 12/14/2018 12/13/17   Elvina Sidle, MD    Family History Family History  Problem Relation Age of Onset  . Diabetes Mother     Social History Social History   Tobacco Use  . Smoking status: Current Every Day Smoker    Packs/day: 0.25    Years: 2.00    Pack years: 0.50    Types: Cigarettes  . Smokeless tobacco: Never Used  Substance Use Topics  . Alcohol use: No  . Drug use: No     Allergies   Patient has no known allergies.   Review of Systems Review of Systems  Constitutional: Negative for fever.  Respiratory: Negative for cough and shortness of breath.   Cardiovascular: Negative for chest pain.  Gastrointestinal: Positive for abdominal pain. Negative for nausea and vomiting.  Genitourinary: Negative for dysuria and hematuria.  Neurological: Negative for headaches.  All other systems reviewed and  are negative.    Physical Exam Updated Vital Signs BP 109/71 (BP Location: Right Arm)   Pulse 76   Temp 99 F (37.2 C) (Oral)   Resp 16   SpO2 100%   Physical Exam Vitals signs and nursing note reviewed.  Constitutional:      Appearance: Normal appearance. She is well-developed.     Comments: Sitting comfortably on examination table  HENT:     Head: Normocephalic and atraumatic.  Eyes:     General: Lids are normal.     Conjunctiva/sclera: Conjunctivae normal.     Pupils: Pupils are equal, round, and reactive to light.  Neck:     Musculoskeletal: Full passive range of motion without pain.  Cardiovascular:     Rate and Rhythm: Normal rate and regular rhythm.     Pulses: Normal pulses.     Heart sounds: Normal heart sounds. No  murmur. No friction rub. No gallop.   Pulmonary:     Effort: Pulmonary effort is normal.     Breath sounds: Normal breath sounds.     Comments: Lungs clear to auscultation bilaterally.  Symmetric chest rise.  No wheezing, rales, rhonchi. Abdominal:     Palpations: Abdomen is soft. Abdomen is not rigid.     Tenderness: There is abdominal tenderness in the epigastric area and periumbilical area. There is no right CVA tenderness, left CVA tenderness or guarding. Negative signs include McBurney's sign.     Comments: Abdomen is soft, non-distended. Tenderness to palpation noted periumbilical and epigastric region.  No rigidity, guarding.  No CVA tenderness bilaterally.  Musculoskeletal: Normal range of motion.  Skin:    General: Skin is warm and dry.     Capillary Refill: Capillary refill takes less than 2 seconds.  Neurological:     Mental Status: She is alert and oriented to person, place, and time.  Psychiatric:        Speech: Speech normal.      ED Treatments / Results  Labs (all labs ordered are listed, but only abnormal results are displayed) Labs Reviewed  COMPREHENSIVE METABOLIC PANEL - Abnormal; Notable for the following components:      Result Value   BUN <5 (*)    All other components within normal limits  CBC WITH DIFFERENTIAL/PLATELET - Abnormal; Notable for the following components:   WBC 12.3 (*)    Neutro Abs 9.9 (*)    All other components within normal limits  URINALYSIS, ROUTINE W REFLEX MICROSCOPIC - Abnormal; Notable for the following components:   Color, Urine STRAW (*)    Specific Gravity, Urine 1.003 (*)    Hgb urine dipstick SMALL (*)    All other components within normal limits  LIPASE, BLOOD  I-STAT BETA HCG BLOOD, ED ([facility], [facility], [facility] ONLY)    EKG None  Radiology No results found.  Procedures Procedures (including critical care time)  Medications Ordered in ED Medications  alum & mag hydroxide-simeth (MAALOX/MYLANTA) 200-200-20 MG/5ML suspension  30 mL (30 mLs Oral Given 12/14/18 1754)    And  lidocaine (XYLOCAINE) 2 % viscous mouth solution 15 mL (15 mLs Oral Given 12/14/18 1754)     Initial Impression / Assessment and Plan / ED Course  I have reviewed the triage vital signs and the nursing notes.  Pertinent labs & imaging results that were available during my care of the patient were reviewed by me and considered in my medical decision making (see chart for details).  28 year old female who presents for evaluation of intermittent abdominal pain that is been ongoing for the last 4 to 5 days.  No associated fevers, nausea/vomiting, chest pain, difficulty breathing, urinary complaints. Patient is afebrile, non-toxic appearing, sitting comfortably on examination table. Vital signs reviewed and stable.  Consider PUD versus gastritis low suspicion given lack of nausea/vomiting.  Also consider infectious process.  History/physical exam not concerning for appendicitis, diverticulitis, obstruction, perforation, ovarian torsion.  CBC shows slight leukocytosis.  Review of records show that she consistently has high white blood cell count.  No evidence of anemia.  I-STAT beta negative.  Lipase unremarkable.  CMP shows no evidence of electrolyte abnormalities.  LFTs within normal limit.  UA shows small hemoglobin.  No evidence of infectious etiology.  Discussed results with the patient. Patient states she is not currently having any pain. Repeat abdominal exam is benign. No evidence of tenderness.  Exam not concerning for appendicitis, obstruction, perforation.  Additionally, do not suspect PID, ovarian torsion source of patient's symptoms.  At this time, given reassuring blood work, reassuring history/physical, no indication for further ultrasound or CT imaging as do not suspect any surgical abdomen.  Additionally, patient is eating in the department not any difficulty. At this time, patient exhibits no emergent life-threatening condition that  require further evaluation in ED or admission.  Recommend patient closely monitor symptoms and return if she has any worsening or concerning symptoms. Patient had ample opportunity for questions and discussion. All patient's questions were answered with full understanding. Strict return precautions discussed. Patient expresses understanding and agreement to plan.   Portions of this note were generated with Scientist, clinical (histocompatibility and immunogenetics). Dictation errors may occur despite best attempts at proofreading.    Final Clinical Impressions(s) / ED Diagnoses   Final diagnoses:  Generalized abdominal pain    ED Discharge Orders    None       Rosana Hoes 12/14/18 2322    Tilden Fossa, MD 12/15/18 1044

## 2018-12-15 ENCOUNTER — Ambulatory Visit: Payer: Medicaid Other

## 2019-02-04 ENCOUNTER — Encounter (HOSPITAL_COMMUNITY): Payer: Self-pay

## 2019-02-04 ENCOUNTER — Ambulatory Visit (HOSPITAL_COMMUNITY)
Admission: EM | Admit: 2019-02-04 | Discharge: 2019-02-04 | Disposition: A | Discharge status: Home / Self Care | Payer: Medicaid Other | Attending: Family Medicine | Admitting: Family Medicine

## 2019-02-04 ENCOUNTER — Other Ambulatory Visit: Payer: Self-pay

## 2019-02-04 DIAGNOSIS — Z0289 Encounter for other administrative examinations: Secondary | ICD-10-CM

## 2019-02-04 DIAGNOSIS — S29011A Strain of muscle and tendon of front wall of thorax, initial encounter: Secondary | ICD-10-CM

## 2019-02-04 MED ORDER — CYCLOBENZAPRINE HCL 10 MG PO TABS: 10.0000 mg | ORAL_TABLET | Freq: Every day | ORAL | 0 refills | Status: DC

## 2019-02-04 MED ORDER — NAPROXEN 375 MG PO TABS: 375.0000 mg | ORAL_TABLET | Freq: Two times a day (BID) | ORAL | 0 refills | Status: DC

## 2019-02-04 NOTE — ED Triage Notes (Signed)
Patient presents to Urgent Care with complaints of feeling like she pulled a muscle on her right rib cage since waking up the day before yesterday. Patient states she does not know what she did, has not taken pain meds.

## 2019-02-04 NOTE — Discharge Instructions (Signed)
Continue conservative management of rest, ice, and gentle stretches Take naproxen as needed for pain relief (may cause abdominal discomfort, ulcers, and GI bleeds avoid taking with other NSAIDs) Take cyclobenzaprine at nighttime for symptomatic relief. Avoid driving or operating heavy machinery while using medication. Follow up with PCP or [facility] if symptoms persist Return or go to the ER if you have any new or worsening symptoms (fever, chills, chest pain, abdominal pain, changes in bowel or bladder habits, pain radiating into lower legs, etc...)

## 2019-02-04 NOTE — ED Provider Notes (Signed)
[facility] CARE CENTER   [id number] 02/04/19 Arrival Time: 1223  CC: Right side pain  SUBJECTIVE: History from: patient. Courtney [name] is a 28 y.o. female complains of RT side pain that began 2 days ago.  Denies a precipitating event or specific injury.  Denies strenuous lifting or repetitive activities at home.  Localizes the pain to the RT lower rib cage.  Describes the pain as constant and as if she pulled a muscle.  Has NOT tried OTC medications.  Symptoms are made worse with stretching with her RT arm over head, and take a deep breath.  Denies similar symptoms in the past.  Denies fever, chills, chest pain, SOB, erythema, ecchymosis, effusion, weakness, numbness and tingling, or skin changes.      Starts new job at a Doctor, hospital.  Requests work note.    ROS: As per HPI.  Past Medical History:  Diagnosis Date  . Eczema   . Gall stones    Past Surgical History:  Procedure Laterality Date  . gallstones removed     No Known Allergies No current facility-administered medications on file prior to encounter.    No current outpatient medications on file prior to encounter.   Social History   Socioeconomic History  . Marital status: Single    Spouse name: Not on file  . Number of children: 2  . Years of education: Not on file  . Highest education level: Not on file  Occupational History  . Not on file  Social Needs  . Financial resource strain: Not on file  . Food insecurity:    Worry: Not on file    Inability: Not on file  . Transportation needs:    Medical: Not on file    Non-medical: Not on file  Tobacco Use  . Smoking status: Current Every Day Smoker    Packs/day: 0.25    Years: 2.00    Pack years: 0.50    Types: Cigarettes  . Smokeless tobacco: Never Used  Substance and Sexual Activity  . Alcohol use: Yes    Comment: every 2-3 days  . Drug use: No  . Sexual activity: Yes    Partners: Male    Birth control/protection: Condom  Lifestyle  . Physical  activity:    Days per week: Not on file    Minutes per session: Not on file  . Stress: Not on file  Relationships  . Social connections:    Talks on phone: Not on file    Gets together: Not on file    Attends religious service: Not on file    Active member of club or organization: Not on file    Attends meetings of clubs or organizations: Not on file    Relationship status: Not on file  . Intimate partner violence:    Fear of current or ex partner: Not on file    Emotionally abused: Not on file    Physically abused: Not on file    Forced sexual activity: Not on file  Other Topics Concern  . Not on file  Social History Narrative  . Not on file   Family History  Problem Relation Age of Onset  . Diabetes Mother     OBJECTIVE:  Vitals:   02/04/19 1242  BP: 110/70  Pulse: 85  Resp: 17  Temp: 98.5 F (36.9 C)  TempSrc: Oral  SpO2: 100%    General appearance: Alert; in no acute distress.  Head: NCAT Lungs: CTA bilaterally Heart: RRR.  Clear  S1 and S2 without murmur, gallops, or rubs.  Radial pulses 2+ bilaterally. Musculoskeletal: Chest wall Palpation: TTP over RT lower rib cage, no bony tenderness; TTP with lateral compression of chest ROM: FROM active  Strength: 5/5 shld abduction, 5/5 shld adduction, 5/5 elbow flexion, 5/5 elbow extension, 5/5 grip strength Skin: warm and dry  Neurologic: Ambulates without difficulty; Sensation intact about the upper extremities Psychological: alert and cooperative; normal mood and affect  ASSESSMENT & PLAN:  1. Muscle strain of chest wall, initial encounter     Meds ordered this encounter  Medications  . naproxen (NAPROSYN) 375 MG tablet    Sig: Take 1 tablet (375 mg total) by mouth 2 (two) times daily.    Dispense:  20 tablet    Refill:  0    Order Specific Question:   Supervising Provider    Answer:   Eustace MooreNELSON, YVONNE SUE [[id number]]  . cyclobenzaprine (FLEXERIL) 10 MG tablet    Sig: Take 1 tablet (10 mg total) by mouth at  bedtime.    Dispense:  5 tablet    Refill:  0    Order Specific Question:   Supervising Provider    Answer:   Eustace MooreELSON, YVONNE SUE [[id number]]    Continue conservative management of rest, ice, and gentle stretches Take naproxen as needed for pain relief (may cause abdominal discomfort, ulcers, and GI bleeds avoid taking with other NSAIDs) Take cyclobenzaprine at nighttime for symptomatic relief. Avoid driving or operating heavy machinery while using medication. Follow up with PCP or [facility] if symptoms persist Return or go to the ER if you have any new or worsening symptoms (fever, chills, chest pain, abdominal pain, changes in bowel or bladder habits, pain radiating into lower legs, etc...)   Reviewed expectations re: course of current medical issues. Questions answered. Outlined signs and symptoms indicating need for more acute intervention. Patient verbalized understanding. After Visit Summary given.    Rennis HardingWurst, Myha Arizpe, PA-C 02/04/19 1335

## 2019-02-24 ENCOUNTER — Emergency Department (HOSPITAL_COMMUNITY)
Admission: EM | Admit: 2019-02-24 | Discharge: 2019-02-25 | Disposition: A | Discharge status: Home / Self Care | Payer: Medicaid Other | Attending: Emergency Medicine | Admitting: Emergency Medicine

## 2019-02-24 ENCOUNTER — Other Ambulatory Visit: Payer: Self-pay

## 2019-02-24 ENCOUNTER — Encounter (HOSPITAL_COMMUNITY): Payer: Self-pay

## 2019-02-24 DIAGNOSIS — N760 Acute vaginitis: Secondary | ICD-10-CM | POA: Insufficient documentation

## 2019-02-24 DIAGNOSIS — Z202 Contact with and (suspected) exposure to infections with a predominantly sexual mode of transmission: Secondary | ICD-10-CM | POA: Insufficient documentation

## 2019-02-24 DIAGNOSIS — F17210 Nicotine dependence, cigarettes, uncomplicated: Secondary | ICD-10-CM | POA: Insufficient documentation

## 2019-02-24 DIAGNOSIS — B9689 Other specified bacterial agents as the cause of diseases classified elsewhere: Secondary | ICD-10-CM | POA: Insufficient documentation

## 2019-02-24 LAB — WET PREP, GENITAL
Sperm: NONE SEEN
Trich, Wet Prep: NONE SEEN
Yeast Wet Prep HPF POC: NONE SEEN

## 2019-02-24 LAB — URINALYSIS, ROUTINE W REFLEX MICROSCOPIC
Bacteria, UA: NONE SEEN
Bilirubin Urine: NEGATIVE
Glucose, UA: NEGATIVE mg/dL
Hgb urine dipstick: NEGATIVE
Ketones, ur: NEGATIVE mg/dL
Nitrite: NEGATIVE
Protein, ur: NEGATIVE mg/dL
Specific Gravity, Urine: 1.018 (ref 1.005–1.030)
pH: 8 (ref 5.0–8.0)

## 2019-02-24 MED ORDER — CEFTRIAXONE SODIUM 250 MG IJ SOLR
250.0000 mg | Freq: Once | INTRAMUSCULAR | Status: AC
  Administered 2019-02-25: 250 mg via INTRAMUSCULAR
  Filled 2019-02-24: qty 250

## 2019-02-24 MED ORDER — AZITHROMYCIN 250 MG PO TABS
1000.0000 mg | ORAL_TABLET | Freq: Once | ORAL | Status: AC
  Administered 2019-02-25: 1000 mg via ORAL
  Filled 2019-02-24: qty 4

## 2019-02-24 NOTE — ED Triage Notes (Signed)
Pt wants to be tested for STI's  was recently with someone who tested positive. Pt has no pain no discharge and patient wants pregnancy test last period 21months ago

## 2019-02-24 NOTE — ED Notes (Signed)
Lab to add-on Hcg.
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
## 2019-02-25 LAB — HCG, QUANTITATIVE, PREGNANCY: hCG, Beta Chain, Quant, S: 1 m[IU]/mL (ref <5)

## 2019-02-25 LAB — HIV ANTIBODY (ROUTINE TESTING W REFLEX): HIV Screen 4th Generation wRfx: NONREACTIVE

## 2019-02-25 LAB — RPR: RPR Ser Ql: NONREACTIVE

## 2019-02-25 MED ORDER — STERILE WATER FOR INJECTION IJ SOLN
INTRAMUSCULAR | Status: AC
  Administered 2019-02-25: 1 mL
  Filled 2019-02-25: qty 10

## 2019-02-25 MED ORDER — METRONIDAZOLE 500 MG PO TABS: 500.0000 mg | ORAL_TABLET | Freq: Two times a day (BID) | ORAL | 0 refills | Status: DC

## 2019-02-25 NOTE — Discharge Instructions (Signed)
You were tested and treated for STDs today.  You have testing pending will be called by phone if any of your results are positive.  Your wet prep also showed evidence of bacterial vaginosis, please take Flagyl twice daily for the next week, do not drink alcohol while taking this medication as it will cause severe nausea and vomiting.  Please avoid sexual intercourse for the next week while your antibiotics take effect.  Make sure when you resume sexual activity that you use protection.  Please contact any partners if you have positive results so that they can be tested and treated as well.  You may follow-up with your OB/GYN or at the health department for further STD testing needs.

## 2019-02-26 LAB — GC/CHLAMYDIA PROBE AMP (~~LOC~~) NOT AT ARMC
Chlamydia: POSITIVE — AB
Neisseria Gonorrhea: NEGATIVE

## 2019-03-16 ENCOUNTER — Other Ambulatory Visit: Payer: Self-pay

## 2019-03-16 ENCOUNTER — Ambulatory Visit (INDEPENDENT_AMBULATORY_CARE_PROVIDER_SITE_OTHER): Payer: Medicaid Other

## 2019-03-16 ENCOUNTER — Encounter (HOSPITAL_COMMUNITY): Payer: Self-pay

## 2019-03-16 ENCOUNTER — Ambulatory Visit (HOSPITAL_COMMUNITY)
Admission: EM | Admit: 2019-03-16 | Discharge: 2019-03-16 | Disposition: A | Discharge status: Home / Self Care | Payer: Medicaid Other | Attending: Family Medicine | Admitting: Family Medicine

## 2019-03-16 DIAGNOSIS — S6991XA Unspecified injury of right wrist, hand and finger(s), initial encounter: Secondary | ICD-10-CM | POA: Diagnosis not present
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
## 2019-03-16 NOTE — Discharge Instructions (Signed)
You may use OTC Tylenol, ibuprofen, ice as needed for additional pain and swelling relief. Follow-up with hand specialist if symptoms worsen

## 2019-05-09 ENCOUNTER — Emergency Department (HOSPITAL_COMMUNITY)
Admission: EM | Admit: 2019-05-09 | Discharge: 2019-05-09 | Disposition: A | Discharge status: Home / Self Care | Payer: Medicaid Other | Attending: Emergency Medicine | Admitting: Emergency Medicine

## 2019-05-09 ENCOUNTER — Encounter (HOSPITAL_COMMUNITY): Payer: Self-pay | Admitting: Emergency Medicine

## 2019-05-09 DIAGNOSIS — F17210 Nicotine dependence, cigarettes, uncomplicated: Secondary | ICD-10-CM | POA: Insufficient documentation

## 2019-05-09 DIAGNOSIS — Z20828 Contact with and (suspected) exposure to other viral communicable diseases: Secondary | ICD-10-CM | POA: Diagnosis not present

## 2019-05-09 DIAGNOSIS — R509 Fever, unspecified: Secondary | ICD-10-CM | POA: Insufficient documentation

## 2019-05-09 NOTE — Discharge Instructions (Signed)
You have been seen today for fever and cough. Please read and follow all provided instructions. Return to the emergency room for worsening condition or new concerning symptoms.    1. Medications:  -You can take Tylenol as needed for fever and body aches.  Please take as directed. -You can take over-the-counter cough medicine if needed Continue usual home medications  2. Treatment: rest, drink plenty of fluids  3. Follow Up: Please follow up with your primary doctor in 2-5 days for discussion of your diagnoses and further evaluation after today's visit; Call today to arrange your follow up.   ?

## 2019-05-09 NOTE — ED Triage Notes (Signed)
Pt  Here from home with c/o fever and cough , no fever today states that she feels fine but her work wanted her to come get checked out

## 2019-05-09 NOTE — ED Provider Notes (Signed)
[facility] EMERGENCY [facility] Provider Note   CSN: [id number] Arrival date & time: 05/09/19  0908    History   Chief Complaint Fever, cough  HPI Courtney [name] is a 28 y.o. female with past medical history of eczema gallstones presents emergency department today with chief complaint of fever x2 days.  Patient states when she woke up yesterday she felt hot so she checked her temperature and it was 100.7.  During the day she noticed she had a dry nonproductive cough.  She called out of work because of the fever and was told in order to return she needs a COVID test.  She did not take any medications for her symptoms prior to arrival.  She reports today that she feels like her normal self. She denies any sick contacts or contact with anyone positive for covid-19.  She denies congestion, sore throat, chest pain, shortness of breath, abdominal pain, nausea, vomiting, diarrhea, urinary symptoms.    Past Medical History:  Diagnosis Date  . Eczema   . Gall stones     Patient Active Problem List   Diagnosis Date Noted  . Indication for care in labor or delivery 09/27/2013  . Normal delivery 09/27/2013  . Right orbit fracture 08/10/2013  . Eczema 04/17/2013  . Mild hyperemesis gravidarum, antepartum 02/19/2013  . General counseling for initiation of other contraceptive measures 01/02/2013  . Anemia, iron deficiency 05/30/2012    Past Surgical History:  Procedure Laterality Date  . gallstones removed       OB History    Gravida  3   Para  3   Term  3   Preterm  0   AB  0   Living  3     SAB  0   TAB  0   Ectopic  0   Multiple  0   Live Births  3            Home Medications    Prior to Admission medications   Not on File    Family History Family History  Problem Relation Age of Onset  . Diabetes Mother   . Healthy Father     Social History Social History   Tobacco Use  . Smoking status: Current Every Day Smoker   Packs/day: 0.25    Years: 2.00    Pack years: 0.50    Types: Cigarettes  . Smokeless tobacco: Never Used  Substance Use Topics  . Alcohol use: Yes    Comment: every 2-3 days  . Drug use: No     Allergies   Patient has no known allergies.   Review of Systems Review of Systems  Constitutional: Positive for fever. Negative for chills.  HENT: Negative for congestion, ear pain, facial swelling, sinus pressure, sinus pain, sneezing, sore throat and trouble swallowing.   Respiratory: Positive for cough. Negative for shortness of breath and wheezing.   Cardiovascular: Negative for chest pain.  Gastrointestinal: Negative for abdominal pain, diarrhea, nausea and vomiting.  Skin: Negative for rash and wound.  Allergic/Immunologic: Negative for immunocompromised state.     Physical Exam Updated Vital Signs BP 113/76 (BP Location: Right Arm)   Pulse 73   Temp 98.1 F (36.7 C) (Oral)   Resp 20   SpO2 100%   Physical Exam Vitals signs and nursing note reviewed.  Constitutional:      Appearance: She is well-developed. She is not ill-appearing or toxic-appearing.  HENT:     Head: Normocephalic and atraumatic.  Nose: Nose normal.  Eyes:     General: No scleral icterus.       Right eye: No discharge.        Left eye: No discharge.     Conjunctiva/sclera: Conjunctivae normal.  Neck:     Musculoskeletal: Normal range of motion.     Vascular: No JVD.  Cardiovascular:     Rate and Rhythm: Normal rate and regular rhythm.     Pulses: Normal pulses.     Heart sounds: Normal heart sounds.  Pulmonary:     Effort: Pulmonary effort is normal.     Breath sounds: Normal breath sounds.  Abdominal:     General: There is no distension.  Musculoskeletal: Normal range of motion.  Skin:    General: Skin is warm and dry.  Neurological:     Mental Status: She is oriented to person, place, and time.     GCS: GCS eye subscore is 4. GCS verbal subscore is 5. GCS motor subscore is 6.      Comments: Fluent speech, no facial droop.  Psychiatric:        Behavior: Behavior normal.      ED Treatments / Results  Labs (all labs ordered are listed, but only abnormal results are displayed) Labs Reviewed  NOVEL CORONAVIRUS, NAA (HOSPITAL ORDER, SEND-OUT TO REF LAB)    EKG None  Radiology No results found.  Procedures Procedures (including critical care time)  Medications Ordered in ED Medications - No data to display   Initial Impression / Assessment and Plan / ED Course  I have reviewed the triage vital signs and the nursing notes.  Pertinent labs & imaging results that were available during my care of the patient were reviewed by me and considered in my medical decision making (see chart for details).  28 year old female presents with fever and cough x1 day.  She is afebrile, very well-appearing, in no acute distress. Lungs are clear to auscultation in all fields. Will perform send out covid test. Pt aware she needs to self quarantine until she has test result. Patient is comfortable with above plan and is stable for discharge at this time. All questions were answered prior to disposition. Strict return precautions for returning to the ED were discussed. Encouraged follow up with PCP.    Courtney [name] was evaluated in Emergency Department on 05/09/2019 for the symptoms described in the history of present illness. She was evaluated in the context of the global COVID-19 pandemic, which necessitated consideration that the patient might be at risk for infection with the SARS-CoV-2 virus that causes COVID-19. Institutional protocols and algorithms that pertain to the evaluation of patients at risk for COVID-19 are in a state of rapid change based on information released by regulatory bodies including the CDC and federal and state organizations. These policies and algorithms were followed during the patient's care in the ED.  This note was prepared using Dragon voice  recognition software and may include unintentional dictation errors due to the inherent limitations of voice recognition software.   Final Clinical Impressions(s) / ED Diagnoses   Final diagnoses:  Fever, unspecified fever cause    ED Discharge Orders    None       Courtney Robins, PA-C 05/09/19 1013    Malvin Johns, MD 05/09/19 1153

## 2019-05-10 LAB — NOVEL CORONAVIRUS, NAA (HOSP ORDER, SEND-OUT TO REF LAB; TAT 18-24 HRS): SARS-CoV-2, NAA: NOT DETECTED

## 2019-11-02 ENCOUNTER — Ambulatory Visit (INDEPENDENT_AMBULATORY_CARE_PROVIDER_SITE_OTHER): Payer: Medicaid Other | Admitting: Obstetrics

## 2019-11-02 ENCOUNTER — Other Ambulatory Visit: Payer: Self-pay

## 2019-11-02 ENCOUNTER — Other Ambulatory Visit (HOSPITAL_COMMUNITY)
Admission: RE | Admit: 2019-11-02 | Discharge: 2019-11-02 | Disposition: A | Discharge status: Home / Self Care | Payer: Medicaid Other | Source: Ambulatory Visit | Attending: Obstetrics | Admitting: Obstetrics

## 2019-11-02 ENCOUNTER — Encounter: Payer: Self-pay | Admitting: Obstetrics

## 2019-11-02 VITALS — BP 114/77 | HR 93 | Ht 64.0 in | Wt 136.0 lb

## 2019-11-02 DIAGNOSIS — Z01419 Encounter for gynecological examination (general) (routine) without abnormal findings: Secondary | ICD-10-CM | POA: Diagnosis present

## 2019-11-02 DIAGNOSIS — N898 Other specified noninflammatory disorders of vagina: Secondary | ICD-10-CM | POA: Insufficient documentation

## 2019-11-02 DIAGNOSIS — Z30013 Encounter for initial prescription of injectable contraceptive: Secondary | ICD-10-CM | POA: Diagnosis not present

## 2019-11-02 DIAGNOSIS — F17210 Nicotine dependence, cigarettes, uncomplicated: Secondary | ICD-10-CM

## 2019-11-02 DIAGNOSIS — B369 Superficial mycosis, unspecified: Secondary | ICD-10-CM

## 2019-11-02 DIAGNOSIS — Z0000 Encounter for general adult medical examination without abnormal findings: Secondary | ICD-10-CM

## 2019-11-02 DIAGNOSIS — Z3009 Encounter for other general counseling and advice on contraception: Secondary | ICD-10-CM

## 2019-11-02 DIAGNOSIS — M549 Dorsalgia, unspecified: Secondary | ICD-10-CM

## 2019-11-02 LAB — POCT URINE PREGNANCY: Preg Test, Ur: NEGATIVE

## 2019-11-02 MED ORDER — MEDROXYPROGESTERONE ACETATE 150 MG/ML IM SUSP
150.0000 mg | Freq: Once | INTRAMUSCULAR | Status: AC
  Administered 2019-11-02: 11:00:00 150 mg via INTRAMUSCULAR

## 2019-11-02 MED ORDER — MEDROXYPROGESTERONE ACETATE 150 MG/ML IM SUSP: 150.0000 mg | INTRAMUSCULAR | 4 refills | Status: DC

## 2019-11-02 NOTE — Addendum Note (Signed)
Addended by: Frutoso Chase on: 11/02/2019 11:50 AM   Modules accepted: Orders

## 2019-11-02 NOTE — Progress Notes (Signed)
Pt is here for annual gyn exam. Last pap 10/06/2017 normal.

## 2019-11-02 NOTE — Progress Notes (Signed)
Subjective:        Courtney [name] is a 29 y.o. female here for a routine exam.  Current complaints: None.    Personal health questionnaire:  Is patient Ashkenazi Jewish, have a family history of breast and/or ovarian cancer: no Is there a family history of uterine cancer diagnosed at age < 4, gastrointestinal cancer, urinary tract cancer, family member who is a Field seismologist syndrome-associated carrier: no Is the patient overweight and hypertensive, family history of diabetes, personal history of gestational diabetes, preeclampsia or PCOS: no Is patient over 67, have PCOS,  family history of premature CHD under age 20, diabetes, smoke, have hypertension or peripheral artery disease:  no At any time, has a partner hit, kicked or otherwise hurt or frightened you?: no Over the past 2 weeks, have you felt down, depressed or hopeless?: no Over the past 2 weeks, have you felt little interest or pleasure in doing things?:no   Gynecologic History Patient's last menstrual period was 10/12/2019 (exact date). Contraception: condoms Last Pap: 12-220      Results: Normal Last mammogram: n/a. Results were: n/a  Obstetric History OB History  Gravida Para Term Preterm AB Living  3 3 3  0 0 3  SAB TAB Ectopic Multiple Live Births  0 0 0 0 3    # Outcome Date GA Lbr Len/2nd Weight Sex Delivery Anes PTL Lv  3 Term 09/27/13 [redacted]w[redacted]d 05:33 / 01:25 7 lb 1.2 oz (3.209 kg) F Vag-Spont EPI  LIV  2 Term 07/05/12 [redacted]w[redacted]d 98:40 / 00:19 8 lb 2.5 oz (3.7 kg) F Vag-Spont EPI  LIV  1 Term 2009 [redacted]w[redacted]d 16:00 8 lb 5 oz (3.771 kg) M Vag-Spont EPI  LIV    Past Medical History:  Diagnosis Date  . Eczema   . Gall stones     Past Surgical History:  Procedure Laterality Date  . gallstones removed       Current Outpatient Medications:  .  medroxyPROGESTERone (DEPO-PROVERA) 150 MG/ML injection, Inject 1 mL (150 mg total) into the muscle every 3 (three) months., Disp: 1 mL, Rfl: 4 No Known Allergies  Social History    Tobacco Use  . Smoking status: Current Every Day Smoker    Packs/day: 0.25    Years: 2.00    Pack years: 0.50    Types: Cigarettes  . Smokeless tobacco: Never Used  Substance Use Topics  . Alcohol use: Yes    Comment: every 2-3 days    Family History  Problem Relation Age of Onset  . Diabetes Mother   . Healthy Father       Review of Systems  Constitutional: negative for fatigue and weight loss Respiratory: negative for cough and wheezing Cardiovascular: negative for chest pain, fatigue and palpitations Gastrointestinal: negative for abdominal pain and change in bowel habits Musculoskeletal:positive for myalgias -  Backache Neurological: negative for gait problems and tremors Behavioral/Psych: negative for abusive relationship, depression Endocrine: negative for temperature intolerance    Genitourinary:negative for abnormal menstrual periods, genital lesions, hot flashes, sexual problems and vaginal discharge Integument/breast: negative for breast lump, breast tenderness, nipple discharge. Positive for skin blemishes and itching    Objective:       BP 114/77   Pulse 93   Ht 5\' 4"  (1.626 m)   Wt 136 lb (61.7 kg)   LMP 10/12/2019 (Exact Date)   BMI 23.34 kg/m  General:   alert  Skin:   depigmented generalized rash  Lungs:   clear to auscultation bilaterally  Heart:   regular rate and rhythm, S1, S2 normal, no murmur, click, rub or gallop  Breasts:   normal without suspicious masses, skin or nipple changes or axillary nodes  Abdomen:  normal findings: no organomegaly, soft, non-tender and no hernia  Pelvis:  External genitalia: normal general appearance Urinary system: urethral meatus normal and bladder without fullness, nontender Vaginal: normal without tenderness, induration or masses Cervix: normal appearance Adnexa: normal bimanual exam Uterus: anteverted and non-tender, normal size   Lab Review Urine pregnancy test Labs reviewed yes Radiologic studies  reviewed no  50% of 25 min visit spent on counseling and coordination of care.   Assessment:     1. Encounter for routine gynecological examination with Papanicolaou smear of cervix Rx - Cytology - PAP( [place])  2. Vaginal discharge Rx: - Cervicovaginal ancillary only( [place])  3. Encounter for other general counseling and advice on contraception - wants to start Depo injections  4. Encounter for initial prescription of injectable contraceptive Rx: - medroxyPROGESTERone (DEPO-PROVERA) injection 150 mg - medroxyPROGESTERone (DEPO-PROVERA) 150 MG/ML injection; Inject 1 mL (150 mg total) into the muscle every 3 (three) months.  Dispense: 1 mL; Refill: 4  5. Tobacco dependence due to cigarettes - tobacco cessation with the aid of medication and behavioral modification recommended  6. Rash - generalized, depigmented and pruritic.  Probable fungal rash    Plan:    Education reviewed: calcium supplements, depression evaluation, low fat, low cholesterol diet, safe sex/STD prevention, self breast exams, smoking cessation and weight bearing exercise. Contraception: Depo-Provera injections. Follow up in: 1 year.   Meds ordered this encounter  Medications  . medroxyPROGESTERone (DEPO-PROVERA) injection 150 mg  . medroxyPROGESTERone (DEPO-PROVERA) 150 MG/ML injection    Sig: Inject 1 mL (150 mg total) into the muscle every 3 (three) months.    Dispense:  1 mL    Refill:  4   Orders Placed This Encounter  Procedures  . Ambulatory referral to Dermatology    Referral Priority:   Routine    Referral Type:   Consultation    Referral Reason:   Specialty Services Required    Requested Specialty:   Dermatology    Number of Visits Requested:   1  . Ambulatory referral to Chiropractic    Referral Priority:   Routine    Referral Type:   Chiropractic    Referral Reason:   Specialty Services Required    Requested Specialty:   Chiropractic Medicine    Number of Visits  Requested:   1    Brock Bad, MD 11/02/2019 11:17 AM

## 2019-11-05 LAB — CERVICOVAGINAL ANCILLARY ONLY
Bacterial Vaginitis (gardnerella): POSITIVE — AB
Candida Glabrata: NEGATIVE
Candida Vaginitis: NEGATIVE
Chlamydia: NEGATIVE
Comment: NEGATIVE
Comment: NEGATIVE
Comment: NEGATIVE
Comment: NEGATIVE
Comment: NEGATIVE
Comment: NORMAL
Neisseria Gonorrhea: NEGATIVE
Trichomonas: NEGATIVE

## 2019-11-06 ENCOUNTER — Other Ambulatory Visit: Payer: Self-pay | Admitting: Obstetrics

## 2019-11-06 DIAGNOSIS — N760 Acute vaginitis: Secondary | ICD-10-CM

## 2019-11-06 DIAGNOSIS — B9689 Other specified bacterial agents as the cause of diseases classified elsewhere: Secondary | ICD-10-CM

## 2019-11-06 LAB — CYTOLOGY - PAP
Chlamydia: NEGATIVE
Comment: NEGATIVE
Comment: NORMAL
Diagnosis: NEGATIVE
Neisseria Gonorrhea: NEGATIVE

## 2019-11-06 MED ORDER — SOLOSEC 2 G PO PACK: 1.0000 | PACK | Freq: Once | ORAL | 2 refills | Status: AC

## 2019-11-09 ENCOUNTER — Other Ambulatory Visit: Payer: Self-pay

## 2019-11-09 DIAGNOSIS — B9689 Other specified bacterial agents as the cause of diseases classified elsewhere: Secondary | ICD-10-CM

## 2019-11-09 MED ORDER — METRONIDAZOLE 500 MG PO TABS: 500.0000 mg | ORAL_TABLET | Freq: Two times a day (BID) | ORAL | 0 refills | Status: DC

## 2020-01-21 ENCOUNTER — Other Ambulatory Visit: Payer: Self-pay | Admitting: Obstetrics

## 2020-01-21 ENCOUNTER — Telehealth: Payer: Self-pay

## 2020-01-21 DIAGNOSIS — M549 Dorsalgia, unspecified: Secondary | ICD-10-CM

## 2020-01-21 NOTE — Telephone Encounter (Signed)
Returned call, pt states that she has been going to a chiropractor and was told that if it did not help she could receive a referral to ortho, routed to provider for review.

## 2020-01-21 NOTE — Telephone Encounter (Signed)
Referred to Neurology 

## 2020-02-12 ENCOUNTER — Encounter: Payer: Self-pay | Admitting: Emergency Medicine

## 2020-02-12 ENCOUNTER — Other Ambulatory Visit: Payer: Self-pay

## 2020-02-12 ENCOUNTER — Ambulatory Visit
Admission: EM | Admit: 2020-02-12 | Discharge: 2020-02-12 | Disposition: A | Discharge status: Home / Self Care | Payer: Medicaid Other | Attending: Emergency Medicine | Admitting: Emergency Medicine

## 2020-02-12 DIAGNOSIS — M940 Chondrocostal junction syndrome [Tietze]: Secondary | ICD-10-CM | POA: Diagnosis not present

## 2020-02-12 MED ORDER — NAPROXEN 500 MG PO TABS: 500.0000 mg | ORAL_TABLET | Freq: Two times a day (BID) | ORAL | 0 refills | Status: DC

## 2020-02-12 NOTE — Discharge Instructions (Signed)
Take anti-inflammatory twice daily with food. May do stretches provided. Return for persistent or worsening symptoms, or you develop fever, cough, chest pain.

## 2020-02-12 NOTE — ED Notes (Signed)
Spoke to provider in regards to patient's complaint.  Will wait for provider exam

## 2020-02-12 NOTE — ED Provider Notes (Signed)
[facility]    CSN: [id number] Arrival date & time: 02/12/20  0956      History   Chief Complaint Chief Complaint  Patient presents with  . Chest Pain    HPI Courtney [name] is a 29 y.o. female presenting for upper sternal chest pain x3 days.  States it has been near constant, though worse when laying down, sneezing, clearing her throat.  States that sharp when this happens.  Denies left or right-sided chest pain, palpitations, shortness of breath, lightheadedness or dizziness.  Denies fall, injury.  No history of reflux, change in diet/lifestyle or medications.  Has not tried nothing for this.   Past Medical History:  Diagnosis Date  . Eczema   . Gall stones     Patient Active Problem List   Diagnosis Date Noted  . Indication for care in labor or delivery 09/27/2013  . Normal delivery 09/27/2013  . Right orbit fracture ([place]) 08/10/2013  . Eczema 04/17/2013  . Mild hyperemesis gravidarum, antepartum 02/19/2013  . General counseling for initiation of other contraceptive measures 01/02/2013  . Anemia, iron deficiency 05/30/2012    Past Surgical History:  Procedure Laterality Date  . gallstones removed      OB History    Gravida  3   Para  3   Term  3   Preterm  0   AB  0   Living  3     SAB  0   TAB  0   Ectopic  0   Multiple  0   Live Births  3            Home Medications    Prior to Admission medications   Medication Sig Start Date End Date Taking? Authorizing Provider  medroxyPROGESTERone (DEPO-PROVERA) 150 MG/ML injection Inject 1 mL (150 mg total) into the muscle every 3 (three) months. 11/02/19   Shelly Bombard, MD  naproxen (NAPROSYN) 500 MG tablet Take 1 tablet (500 mg total) by mouth 2 (two) times daily. 02/12/20   Hall-Potvin, [place], PA-C    Family History Family History  Problem Relation Age of Onset  . Diabetes Mother   . Healthy Father     Social History Social History   Tobacco Use  . Smoking status:  Current Every Day Smoker    Packs/day: 0.25    Years: 2.00    Pack years: 0.50    Types: Cigarettes  . Smokeless tobacco: Never Used  Substance Use Topics  . Alcohol use: Yes    Comment: every 2-3 days  . Drug use: No     Allergies   Patient has no known allergies.   Review of Systems As per HPI   Physical Exam Triage Vital Signs ED Triage Vitals  Enc Vitals Group     BP 02/12/20 1030 123/82     Pulse Rate 02/12/20 1030 79     Resp 02/12/20 1030 16     Temp 02/12/20 1030 98 F (36.7 C)     Temp Source 02/12/20 1030 Oral     SpO2 02/12/20 1030 97 %     Weight --      Height --      Head Circumference --      Peak Flow --      Pain Score 02/12/20 1037 10     Pain Loc --      Pain Edu? --      Excl. in [place]? --    No data found.  Updated Vital Signs BP 123/82 (BP Location: Right Arm)   Pulse 79   Temp 98 F (36.7 C) (Oral)   Resp 16   SpO2 97%   Visual Acuity Right Eye Distance:   Left Eye Distance:   Bilateral Distance:    Right Eye Near:   Left Eye Near:    Bilateral Near:     Physical Exam Constitutional:      General: She is not in acute distress.    Appearance: She is well-developed and normal weight. She is not ill-appearing or diaphoretic.  HENT:     Head: Normocephalic and atraumatic.  Eyes:     General: No scleral icterus.    Pupils: Pupils are equal, round, and reactive to light.  Cardiovascular:     Rate and Rhythm: Normal rate and regular rhythm.  No extrasystoles are present.    Chest Wall: PMI is not displaced.     Heart sounds: Normal heart sounds.  Pulmonary:     Effort: Pulmonary effort is normal. No tachypnea, accessory muscle usage or respiratory distress.     Breath sounds: Normal breath sounds.     Comments: Good air entry bilaterally without prolonged expiratory phase Chest:    Skin:    Coloration: Skin is not jaundiced or pale.  Neurological:     Mental Status: She is alert and oriented to person, place, and time.       UC Treatments / Results  Labs (all labs ordered are listed, but only abnormal results are displayed) Labs Reviewed - No data to display  EKG   Radiology No results found.  Procedures Procedures (including critical care time)  Medications Ordered in UC Medications - No data to display  Initial Impression / Assessment and Plan / UC Course  I have reviewed the triage vital signs and the nursing notes.  Pertinent labs & imaging results that were available during my care of the patient were reviewed by me and considered in my medical decision making (see chart for details).     H&P concerning for costochondritis.  Will start NSAID today, push fluids, and follow-up with PCP in 1-2 weeks.  Return precautions discussed, patient verbalized understanding and is agreeable to plan. Final Clinical Impressions(s) / UC Diagnoses   Final diagnoses:  Costochondritis     Discharge Instructions     Take anti-inflammatory twice daily with food. May do stretches provided. Return for persistent or worsening symptoms, or you develop fever, cough, chest pain.    ED Prescriptions    Medication Sig Dispense Auth. Provider   naproxen (NAPROSYN) 500 MG tablet Take 1 tablet (500 mg total) by mouth 2 (two) times daily. 30 tablet Hall-Potvin, [place], PA-C     PDMP not reviewed this encounter.   Hall-Potvin, [place], [place] 02/12/20 1935

## 2020-02-12 NOTE — ED Triage Notes (Signed)
Touches upper chest as location of pain.  Worsened by lying down, sneezing, denies cough patient reports pain is sharp.  Patient reports pain for 3 days

## 2020-02-22 ENCOUNTER — Ambulatory Visit: Payer: Self-pay | Admitting: Neurology

## 2020-03-14 ENCOUNTER — Other Ambulatory Visit: Payer: Self-pay

## 2020-03-14 ENCOUNTER — Ambulatory Visit (HOSPITAL_COMMUNITY)
Admission: EM | Admit: 2020-03-14 | Discharge: 2020-03-14 | Disposition: A | Discharge status: Home / Self Care | Payer: Medicaid Other | Attending: Family Medicine | Admitting: Family Medicine

## 2020-03-14 ENCOUNTER — Encounter (HOSPITAL_COMMUNITY): Payer: Self-pay

## 2020-03-14 DIAGNOSIS — L0231 Cutaneous abscess of buttock: Secondary | ICD-10-CM

## 2020-03-14 DIAGNOSIS — H65191 Other acute nonsuppurative otitis media, right ear: Secondary | ICD-10-CM | POA: Diagnosis not present

## 2020-03-14 MED ORDER — AMOXICILLIN-POT CLAVULANATE 875-125 MG PO TABS: 1.0000 | ORAL_TABLET | Freq: Two times a day (BID) | ORAL | 0 refills | Status: AC

## 2020-03-14 NOTE — Discharge Instructions (Signed)
I have sent in Augmentin for you to take  This will cover your skin infection and your ear  Follow up as needed

## 2020-03-14 NOTE — ED Triage Notes (Signed)
Pt c/o right ear fullness, decreased hearing for approx 2 weeks.  Pt also c/o "bump" to left buttocks/gluteal crease for approx 1 week. Denies drainage from area.  Denies fever, chills, n/v/d.

## 2020-03-15 NOTE — ED Provider Notes (Signed)
[facility]   [id number] 03/14/20 Arrival Time: 1809  CC: EAR PAIN  SUBJECTIVE: History from: patient.  Courtney [name] is a 29 y.o. female who presents with of right ear pain for the past 2 weeks. Denies a precipitating event, such as swimming or wearing ear plugs. Patient states the pain is constant and achy in character. Has not taken OTC meds for this. Symptoms are made worse with lying down. Denies similar symptoms in the past. Also reports that she has a painful bump to her left buttock that has been there for about a week. Has not tried OTC medications for this. Denies fever, chills, fatigue, sinus pain, rhinorrhea, ear discharge, sore throat, SOB, wheezing, chest pain, nausea, changes in bowel or bladder habits.    ROS: As per HPI.  All other pertinent ROS negative.     Past Medical History:  Diagnosis Date  . Eczema   . Gall stones    Past Surgical History:  Procedure Laterality Date  . gallstones removed     No Known Allergies No current facility-administered medications on file prior to encounter.   Current Outpatient Medications on File Prior to Encounter  Medication Sig Dispense Refill  . medroxyPROGESTERone (DEPO-PROVERA) 150 MG/ML injection Inject 1 mL (150 mg total) into the muscle every 3 (three) months. 1 mL 4  . naproxen (NAPROSYN) 500 MG tablet Take 1 tablet (500 mg total) by mouth 2 (two) times daily. 30 tablet 0   Social History   Socioeconomic History  . Marital status: Single    Spouse name: Not on file  . Number of children: 2  . Years of education: Not on file  . Highest education level: Not on file  Occupational History  . Not on file  Tobacco Use  . Smoking status: Current Every Day Smoker    Packs/day: 0.25    Years: 2.00    Pack years: 0.50    Types: Cigarettes  . Smokeless tobacco: Never Used  Substance and Sexual Activity  . Alcohol use: Yes    Comment: every 2-3 days  . Drug use: No  . Sexual activity: Yes    Partners:  Male    Birth control/protection: Condom, Injection  Other Topics Concern  . Not on file  Social History Narrative  . Not on file   Social Determinants of Health   Financial Resource Strain:   . Difficulty of Paying Living Expenses:   Food Insecurity:   . Worried About Charity fundraiser in the Last Year:   . Arboriculturist in the Last Year:   Transportation Needs:   . Film/video editor (Medical):   Marland Kitchen Lack of Transportation (Non-Medical):   Physical Activity:   . Days of Exercise per Week:   . Minutes of Exercise per Session:   Stress:   . Feeling of Stress :   Social Connections:   . Frequency of Communication with Friends and Family:   . Frequency of Social Gatherings with Friends and Family:   . Attends Religious Services:   . Active Member of Clubs or Organizations:   . Attends Archivist Meetings:   Marland Kitchen Marital Status:   Intimate Partner Violence:   . Fear of Current or Ex-Partner:   . Emotionally Abused:   Marland Kitchen Physically Abused:   . Sexually Abused:    Family History  Problem Relation Age of Onset  . Diabetes Mother   . Healthy Father     OBJECTIVE:  Vitals:  03/14/20 1857  BP: 112/78  Pulse: 98  Resp: 16  Temp: 98.7 F (37.1 C)  TempSrc: Oral  SpO2: 98%     General appearance: alert; appears fatigued HEENT: Ears: EACs clear, R TM erythematous, bulging with effusion , L TM pearly gray with visible cone of light, without erythema; Eyes: PERRL, EOMI grossly; Sinuses nontender to palpation; Nose: clear rhinorrhea; Throat: oropharynx mildly erythematous, tonsils 1+ without white tonsillar exudates, uvula midline Neck: supple without LAD Lungs: unlabored respirations, symmetrical air entry; cough: absent; no respiratory distress Heart: regular rate and rhythm.  Radial pulses 2+ symmetrical bilaterally Skin: warm and dry, about 0.5cm in diameter area of raised, tender skin, no drainage, no fluctuance Psychological: alert and cooperative;  normal mood and affect  Imaging: No results found.   ASSESSMENT & PLAN:  1. Other non-recurrent acute nonsuppurative otitis media of right ear   2. Abscess of buttock, right     Meds ordered this encounter  Medications  . amoxicillin-clavulanate (AUGMENTIN) 875-125 MG tablet    Sig: Take 1 tablet by mouth 2 (two) times daily for 10 days.    Dispense:  20 tablet    Refill:  0    Order Specific Question:   Supervising Provider    Answer:   Merrilee Jansky [[id number]]    Abscess Right OM Rest and drink plenty of fluids Prescribed augmentin 875 for 7-10 days Take medications as directed and to completion Continue to use OTC ibuprofen and/ or tylenol as needed for pain control Follow up with PCP if symptoms persists Return here or go to the ER if you have any new or worsening symptoms   Reviewed expectations re: course of current medical issues. Questions answered. Outlined signs and symptoms indicating need for more acute intervention. Patient verbalized understanding. After Visit Summary given.          Moshe Cipro, NP 03/15/20 1019

## 2020-04-08 ENCOUNTER — Other Ambulatory Visit: Payer: Self-pay

## 2020-04-08 ENCOUNTER — Ambulatory Visit: Payer: Medicaid Other | Admitting: Neurology

## 2020-04-08 ENCOUNTER — Encounter: Payer: Self-pay | Admitting: Neurology

## 2020-04-08 VITALS — BP 113/74 | HR 84 | Ht 64.0 in | Wt 148.5 lb

## 2020-04-08 DIAGNOSIS — M549 Dorsalgia, unspecified: Secondary | ICD-10-CM | POA: Insufficient documentation

## 2020-04-08 MED ORDER — DULOXETINE HCL 60 MG PO CPEP: 60.0000 mg | ORAL_CAPSULE | Freq: Every day | ORAL | 12 refills | Status: DC

## 2020-04-08 MED ORDER — CYCLOBENZAPRINE HCL 5 MG PO TABS: 5.0000 mg | ORAL_TABLET | Freq: Every evening | ORAL | 6 refills | Status: DC | PRN

## 2020-04-08 MED ORDER — MELOXICAM 7.5 MG PO TABS: 7.5000 mg | ORAL_TABLET | Freq: Every day | ORAL | 5 refills | Status: DC | PRN

## 2020-04-08 NOTE — Progress Notes (Signed)
HISTORICAL  Courtney Courtney [name] is a 29 year old female, seen in request by her primary care physician Dr. Clearance Courtney [name], Courtney Courtney [name] for evaluation of upper back pain, initial evaluation was on April 08, 2020.  I reviewed and summarized the referring note.  She used to work at [employer], currently working as a Engineer, agricultural, which require heavy lifting occasionally, she denies difficulty performing her job,  About 3 years ago, while she was working at [employer], which require constant movement, lifting sometimes, she developed upper back pain, between the shoulder blade, deep muscle achy pain, but she denies radiating pain to her upper extremity, she denies gait abnormality  Over the years, she has tried heating pad, over-the-counter ibuprofen, Tylenol without helping, also failed massage therapy, chiropractic therapy  She has never tried physical therapy, she denies bowel bladder incontinence, now has difficulty moving arms, next due to limitation of the pain, muscle stiffness, especially early morning times,  Per patient, x-ray from chiropractor mild exacerbated curvature of the thoracic spine  REVIEW OF SYSTEMS: Full 14 system review of systems performed and notable only for as above All other review of systems were negative.  ALLERGIES: No Known Allergies  HOME MEDICATIONS: Current Outpatient Medications  Medication Sig Dispense Refill  . naproxen (NAPROSYN) 500 MG tablet Take 1 tablet (500 mg total) by mouth 2 (two) times daily. (Patient not taking: Reported on 04/08/2020) 30 tablet 0   No current facility-administered medications for this visit.    PAST MEDICAL HISTORY: Past Medical History:  Diagnosis Date  . Back pain   . Eczema   . Gall stones     PAST SURGICAL HISTORY: Past Surgical History:  Procedure Laterality Date  . gallstones removed      FAMILY HISTORY: Family History  Problem Relation Age of Onset  . Diabetes Mother   . Healthy Father     SOCIAL  HISTORY: Social History   Socioeconomic History  . Marital status: Single    Spouse name: Not on file  . Number of children: 3  . Years of education: 43  . Highest education level: High school graduate  Occupational History  . 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 Smoking status: Current Every Day Smoker    Packs/day: 0.25    Years: 2.00    Pack years: 0.50    Types: Cigarettes  . Smokeless tobacco: Never Used  Vaping Use  . Vaping Use: Never used  Substance and Sexual Activity  . Alcohol use: Yes    Comment: every 2-3 days  . Drug use: No  . Sexual activity: Yes    Partners: Male    Birth control/protection: Condom, Injection  Other Topics Concern  . Not on file  Social History Narrative   Lives at home with children.   Right-handed.   No daily use of caffeine.   Social Determinants of Health   Financial Resource Strain:   . Difficulty of Paying Living Expenses:   Food Insecurity:   . Worried About Programme researcher, broadcasting/film/video in the Last Year:   . Barista in the Last Year:   Transportation Needs:   . Freight forwarder (Medical):   Marland Kitchen Lack of Transportation (Non-Medical):   Physical Activity:   . Days of Exercise per Week:   . Minutes of Exercise per Session:   Stress:   . Feeling of Stress :   Social Connections:   . Frequency of Communication with Friends and Family:   .  Frequency of Social Gatherings with Friends and Family:   . Attends Religious Services:   . Active Member of Clubs or Organizations:   . Attends Banker Meetings:   Marland Kitchen Marital Status:   Intimate Partner Violence:   . Fear of Current or Ex-Partner:   . Emotionally Abused:   Marland Kitchen Physically Abused:   . Sexually Abused:      PHYSICAL EXAM   Vitals:   04/08/20 1527  BP: 113/74  Pulse: 84  Weight: 148 lb 8 oz (67.4 kg)  Height: 5\' 4"  (1.626 m)   Not recorded     Body mass index is 25.49 kg/m.  PHYSICAL EXAMNIATION:  Gen: NAD,  conversant, well nourised, well groomed                     Cardiovascular: Regular rate rhythm, no peripheral edema, warm, nontender. Eyes: Conjunctivae clear without exudates or hemorrhage Neck: Supple, no carotid bruits. Pulmonary: Clear to auscultation bilaterally   NEUROLOGICAL EXAM:  MENTAL STATUS: Speech:    Speech is normal; fluent and spontaneous with normal comprehension.  Cognition:     Orientation to time, place and person     Normal recent and remote memory     Normal Attention span and concentration     Normal Language, naming, repeating,spontaneous speech     Fund of knowledge   CRANIAL NERVES: CN II: Visual fields are full to confrontation. Pupils are round equal and briskly reactive to light. CN III, IV, VI: extraocular movement are normal. No ptosis. CN V: Facial sensation is intact to light touch CN VII: Face is symmetric with normal eye closure  CN VIII: Hearing is normal to causal conversation. CN IX, X: Phonation is normal. CN XI: Head turning and shoulder shrug are intact  MOTOR: There is no pronator drift of out-stretched arms. Muscle bulk and tone are normal. Muscle strength is normal.  REFLEXES: Reflexes are 2+ and symmetric at the biceps, triceps, knees, and ankles. Plantar responses are flexor.  SENSORY: Intact to light touch, pinprick and vibratory sensation are intact in fingers and toes.  COORDINATION: There is no trunk or limb dysmetria noted.  GAIT/STANCE: She can get up from seated position arms crossed, mildly antalgic due to upper back pain, spasm  DIAGNOSTIC DATA (LABS, IMAGING, TESTING) - I reviewed patient records, labs, notes, testing and imaging myself where available.   ASSESSMENT AND PLAN  Courtney Courtney [name] is a 29 y.o. female   Upper back pain  Essentially normal neurological examination, limitation is Courtney [name] likely due to muscle achiness and stiffness  Mobic as needed, Flexeril as needed  Cymbalta 60 mg daily  Referral to  physical therapy   Courtney [name], M.D. Ph.D.  [facility] [address], [address] [place], [place] Ph: [phone] Fax: [phone]  CC:  [phone], MD [address] [place],  [place]

## 2020-04-29 ENCOUNTER — Ambulatory Visit: Payer: Medicaid Other | Admitting: Physical Therapy

## 2020-04-30 ENCOUNTER — Other Ambulatory Visit: Payer: Self-pay

## 2020-04-30 ENCOUNTER — Encounter: Payer: Self-pay | Admitting: Physical Therapy

## 2020-04-30 ENCOUNTER — Ambulatory Visit: Payer: Medicaid Other | Attending: Neurology | Admitting: Physical Therapy

## 2020-04-30 DIAGNOSIS — R293 Abnormal posture: Secondary | ICD-10-CM | POA: Insufficient documentation

## 2020-04-30 DIAGNOSIS — M6281 Muscle weakness (generalized): Secondary | ICD-10-CM | POA: Insufficient documentation

## 2020-04-30 DIAGNOSIS — M546 Pain in thoracic spine: Secondary | ICD-10-CM | POA: Diagnosis present

## 2020-04-30 NOTE — Patient Instructions (Signed)
Access Code: [email or web address] URL: [email or web address] Date: 04/30/2020 Prepared by: Rosana Hoes  Exercises Sidelying Thoracic Lumbar Rotation - 2 x daily - 7 x weekly - 10 reps - 2-3 seconds hold Cat-Camel - 2 x daily - 7 x weekly - 10 reps Standing Bilateral Low Shoulder Row with Anchored Resistance - 1 x daily - 7 x weekly - 2 sets - 10 reps Shoulder External Rotation and Scapular Retraction with Resistance - 1 x daily - 7 x weekly - 2 sets - 10 reps

## 2020-05-01 ENCOUNTER — Other Ambulatory Visit: Payer: Self-pay

## 2020-05-01 NOTE — Therapy (Signed)
[facility] [address] [place], [place], [address] Phone: [phone]   Fax:  [phone]  Physical Therapy Evaluation  Patient Details  Name: Courtney [name] MRN: [id number] Date of Birth: 01-11-1991 Referring Provider (PT): Levert Feinstein, MD   Encounter Date: 04/30/2020   PT End of Session - 05/01/20 0811    Visit Number 1    Number of Visits 4    Date for PT Re-Evaluation 05/28/20    Authorization Type [facility] [place]    PT Start Time 1620   patient arrived late   PT Stop Time 1700    PT Time Calculation (min) 40 min    Activity Tolerance Patient tolerated treatment well    Behavior During Therapy [facility] for tasks assessed/performed           Past Medical History:  Diagnosis Date  . Back pain   . Eczema   . Gall stones     Past Surgical History:  Procedure Laterality Date  . gallstones removed      There were no vitals filed for this visit.    Subjective Assessment - 04/30/20 1620    Subjective Patient reports upper/mid back pain for years that gradually came on with no mechanism of injury. She has tried a Land who did x-rays and told her she had a curve at the top and they couldn't do anything, so they referred her to a neurologist and then the neurologist referred her to PT. Patient reports that sitting or driving for extended periods will bring on pain, she is also woken by her pain. She has tried heating pads and medication with minimal benefit. Pain is sharp and stays between the shoulder blades.    Limitations Sitting;Lifting;House hold activities    How long can you sit comfortably? 20 minutes    How long can you stand comfortably? No limitation    How long can you walk comfortably? No limitation    Diagnostic tests X-ray (chiropractor) - patient states she has curviture at the top of the spine    Patient Stated Goals Get rid of upper back pain    Currently in Pain? Yes    Pain Score 7     Pain Location  Back    Pain Orientation Upper;Mid   between shoulder blades   Pain Descriptors / Indicators Sharp    Pain Type Chronic pain    Pain Onset More than a month ago    Pain Frequency Intermittent    Aggravating Factors  Sitting, standing up quickly, driving    Pain Relieving Factors Nothing reported    Effect of Pain on Daily Activities Patient limited with sitting, driving, lifting, carrying, reaching overhead              [facility] PT Assessment - 05/01/20 0001      Assessment   Medical Diagnosis Upper back pain    Referring Provider (PT) Levert Feinstein, MD    Onset Date/Surgical Date --   patient reports this has been going on for years   Hand Dominance Right    Next MD Visit 07/09/2020    Prior Therapy None      Precautions   Precautions None      Restrictions   Weight Bearing Restrictions No      Balance Screen   Has the patient fallen in the past 6 months No    Has the patient had a decrease in activity level because of a fear of falling?  No    Is the patient reluctant to leave their home because of a fear of falling?  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, cook, get dressed    Leisure None reported - patient has 3 kids      Cognition   Overall Cognitive Status Within Functional Limits for tasks assessed      Observation/Other Assessments   Observations Patient appears in no apparent distress    Focus on Therapeutic Outcomes (FOTO)  NA - MCD      Sensation   Light Touch Appears Intact      Coordination   Gross Motor Movements are Fluid and Coordinated Yes      Posture/Postural Control   Posture Comments Patient exhibits rounded shoulder and forward  head posture, no significant scapular winging or scoliosis noted      ROM / Strength   AROM / PROM / Strength AROM;Strength      AROM   Overall AROM Comments Cervical motion grossly WFL and non-painful    AROM Assessment Site Shoulder    Right/Left Shoulder Right;Left    Right Shoulder Flexion 150 Degrees    Right Shoulder ABduction 120 Degrees    Right Shoulder Internal Rotation --   reach to T10   Right Shoulder External Rotation --   reach to occiput   Left Shoulder Flexion 140 Degrees    Left Shoulder ABduction 120 Degrees    Left Shoulder Internal Rotation --   reach to L1   Left Shoulder External Rotation --   unbale to reach behind head     Strength   Overall Strength Comments Periscapular strength grossly 4-/5 MMT with increased pain during all muscle testing    Strength Assessment Site Shoulder    Right/Left Shoulder Right;Left    Right Shoulder Extension 4-/5    Right Shoulder ABduction 4-/5    Right Shoulder Internal Rotation 4+/5    Right Shoulder External Rotation 4-/5    Left Shoulder Extension 4-/5    Left Shoulder ABduction 4-/5    Left Shoulder Internal Rotation 4+/5    Left Shoulder External Rotation 4-/5      Palpation   Spinal mobility Increased discomfort with thoracic mobility testing in all directions, extension worst    Palpation comment TTP to periscapular musculature between medial border of scapula and spine with trigger points noted, patient unable to tolerate any TPR this visit      Special Tests   Other special tests None performed      Transfers   Transfers Independent with all Transfers                      Objective measurements completed on examination: See above findings.       [facility] - 05/01/20 0001      Exercises   Exercises Shoulder      Shoulder Exercises: Seated   External Rotation 10 reps    Theraband Level (Shoulder External Rotation) Level 1 (Yellow)    External Rotation  Limitations double ER  Shoulder Exercises: Standing   Row 10 reps    Theraband Level (Shoulder Row) Level 1 (Yellow)    Row Limitations cueing required to avoid shrug      Shoulder Exercises: Stretch   Other Shoulder Stretches Sidelying thoracic rotation x10 each with 3 sec hold    Other Shoulder Stretches Cat Camel x10 - extension to tolerance                  PT Education - 04/30/20 1658    Education Details Exam findings, POC, HEP, posture    Person(s) Educated Patient    Methods Explanation;Demonstration;Tactile cues;Verbal cues;Handout    Comprehension Verbalized understanding;Returned demonstration;Verbal cues required;Tactile cues required;Need further instruction            PT Short Term Goals - 05/01/20 1610      PT SHORT TERM GOAL #1   Title Patient will be I with initial HEP to progress with PT    Baseline HEP provided at evaluation    Time 4    Period Weeks    Status New    Target Date 05/28/20      PT SHORT TERM GOAL #2   Title Patient will exhibit full shoulder AROM bilaterally to improve dressing and self care tasks    Baseline Patient exhibits limitation in active shoulder flexion bilaterally (Right: 150 deg, Left: 140 deg) and unable to reach overhead to upper back    Time 4    Period Weeks    Status New    Target Date 05/28/20      PT SHORT TERM GOAL #3   Title Patient will report resting upper back pain level to </= 4/10 to improve sitting ability    Baseline Patient reports 7/10 pain at rest    Time 4    Period Weeks    Status New    Target Date 05/28/20      PT SHORT TERM GOAL #4   Title Patient will be able to demonstrate appropriate seated posture to reduce increased stress and discomfort of upper back    Baseline Patient exhibits rounded shoulder and forward head posture    Time 4    Period Weeks    Status New    Target Date 05/28/20             PT Long Term Goals - 05/01/20 0815      PT LONG TERM GOAL #1   Title  Patient will be I with final HEP to maintain progress from PT    Time 8    Period Weeks    Status New    Target Date 06/25/20      PT LONG TERM GOAL #2   Title Patient will demonstrate improved periscapular strength to 4+/5 MMT bilaterally to improve postural control and reduce pain    Time 8    Period Weeks    Status New    Target Date 06/25/20      PT LONG TERM GOAL #3   Title Patient will be able to sit >/= 1 hour with </= 3/10 pain level to improve driving ability    Time 8    Period Weeks    Status New    Target Date 06/25/20      PT LONG TERM GOAL #4   Title Patient will exhibit normalized thoracic mobility to improve lifting and carrying ability    Time 8    Period Weeks    Status New  Target Date 06/25/20                  Plan - 05/01/20 6237    Clinical Impression Statement Patient presents to PT with report of chronic thoracic pain located between the medial border of scapula and spine bilaterally, with left side being worse than right. Her symptoms seem musculoskeletal in nature and related to poor posturing, trigger points of periscapular muscles including traps and rhomboids, mobility deficit of thoracic spine, and strength deficit for postural musculature. Patient was provided with exercises to initiate thoracic mobility and postural strengthening, and she would benefit from continued skilled PT to reduce pain and improve posture to allow her to sit and perform activities such as lifting and daily tasks without limitation.    Personal Factors and Comorbidities Time since onset of injury/illness/exacerbation;Past/Current Experience    Examination-Activity Limitations Reach Overhead;Sleep;Sit;Lift;Hygiene/Grooming;Dressing;Carry;Caring for Others;Bathing    Examination-Participation Restrictions Cleaning;Community Activity;Driving;Shop;Laundry;Yard Work    Stability/Clinical Decision Making Stable/Uncomplicated    Clinical Decision Making Low    Rehab  Potential Good    PT Frequency 1x / week    PT Duration 8 weeks    PT Treatment/Interventions ADLs/Self Care Home Management;Cryotherapy;Electrical Stimulation;Iontophoresis 4mg /ml Dexamethasone;Moist Heat;Ultrasound;Neuromuscular re-education;Therapeutic exercise;Therapeutic activities;Functional mobility training;Patient/family education;Manual techniques;Dry needling;Passive range of motion;Taping;Spinal Manipulations;Joint Manipulations    PT Next Visit Plan Assess HEP and progress PRN, manual/dry needling for periscapular musculature (traps and rhomboid region), thoracic mobility, periscapular/postural strengthening    PT Home Exercise Plan : sidelying thoracic rotation, cat camel, row with yellow, double ER with yellow    Consulted and Agree with Plan of Care Patient          Check all possible CPT codes:      [x]  97110 (Therapeutic Exercise)  []  92507 (SLP Treatment)  [x]  97112 (Neuro Re-ed)   []  92526 (Swallowing Treatment)   []  97116 (Gait Training)   []  [id number] (Cognitive Training, 1st 15 minutes) [x]  97140 (Manual Therapy)   []  97130 (Cognitive Training, each add'l 15 minutes)  [x]  97530 (Therapeutic Activities)  []  Other, List CPT Code ____________    [x]  97535 (Self Care)       [x]  All codes above (97110 - 97535)  []  97012 (Mechanical Traction)  [x]  97014 (E-stim Unattended)  []  97032 (E-stim manual)  [x]  97033 (Ionto)  [x]  97035 (Ultrasound)  []  97016 (Vaso)  []  97760 (Orthotic Fit) []  (Prosthetic Training) []  (Physical Performance Training) []  (Aquatic Therapy) []  (Canalith Repositioning) []  (Contrast Bath) []  (Paraffin) []  97597 (Wound Care 1st 20 sq cm) []  97598 (Wound Care each add'l 20 sq cm)    Patient will benefit from skilled therapeutic intervention in order to improve the following deficits and impairments:  Decreased range of motion, Pain, Decreased activity tolerance, Decreased strength, Postural  dysfunction  Visit Diagnosis: Pain in thoracic spine  Muscle weakness (generalized)  Abnormal posture     Problem List Patient Active Problem List   Diagnosis Date Noted  . Upper back pain 04/08/2020  . Indication for care in labor or delivery 09/27/2013  . Normal delivery 09/27/2013  . Right orbit fracture (HCC) 08/10/2013  . Eczema 04/17/2013  . Mild hyperemesis gravidarum, antepartum 02/19/2013  . General counseling for initiation of other contraceptive measures 01/02/2013  . Anemia, iron deficiency 05/30/2012    , PT, DPT, LAT, ATC 05/01/20  8:51 AM Phone: [phone] Fax: [phone]   [facility] [address] [place], [id number],  [address] Phone: [phone]   Fax:  [phone]  Name: Courtney [name] MRN: [id number] Date of Birth: Apr 14, 1991

## 2020-05-14 ENCOUNTER — Telehealth: Payer: Self-pay | Admitting: Physical Therapy

## 2020-05-14 ENCOUNTER — Ambulatory Visit: Payer: Medicaid Other | Attending: Neurology | Admitting: Physical Therapy

## 2020-05-14 DIAGNOSIS — M546 Pain in thoracic spine: Secondary | ICD-10-CM | POA: Insufficient documentation

## 2020-05-14 DIAGNOSIS — R293 Abnormal posture: Secondary | ICD-10-CM | POA: Insufficient documentation

## 2020-05-14 DIAGNOSIS — M6281 Muscle weakness (generalized): Secondary | ICD-10-CM | POA: Insufficient documentation

## 2020-05-14 NOTE — Telephone Encounter (Signed)
Attempted to contact patient due to missing PT appointment. LVM informing patient of missed appointment, reminding her of attendance policy and date/time of next scheduled appointment.   Rosana Hoes, PT, DPT, LAT, ATC 05/14/20  5:30 PM Phone: [phone] Fax: [phone]

## 2020-05-21 ENCOUNTER — Ambulatory Visit: Payer: Medicaid Other | Admitting: Physical Therapy

## 2020-05-28 ENCOUNTER — Ambulatory Visit: Payer: Medicaid Other | Admitting: Physical Therapy

## 2020-05-28 ENCOUNTER — Encounter: Payer: Self-pay | Admitting: Physical Therapy

## 2020-05-28 ENCOUNTER — Other Ambulatory Visit: Payer: Self-pay

## 2020-05-28 DIAGNOSIS — R293 Abnormal posture: Secondary | ICD-10-CM | POA: Diagnosis present

## 2020-05-28 DIAGNOSIS — M546 Pain in thoracic spine: Secondary | ICD-10-CM | POA: Diagnosis not present

## 2020-05-28 DIAGNOSIS — M6281 Muscle weakness (generalized): Secondary | ICD-10-CM | POA: Diagnosis present

## 2020-05-29 NOTE — Patient Instructions (Signed)
Access Code: [email or web address] URL: [email or web address] Date: 05/29/2020 Prepared by: Rosana Hoes  Exercises Sidelying Thoracic Lumbar Rotation - 2 x daily - 7 x weekly - 10 reps - 2-3 seconds hold Cat-Camel - 2 x daily - 7 x weekly - 10 reps Standing Bilateral Low Shoulder Row with Anchored Resistance - 1 x daily - 7 x weekly - 2 sets - 10 reps Shoulder External Rotation and Scapular Retraction with Resistance - 1 x daily - 7 x weekly - 2 sets - 10 reps Child's Pose Stretch - 2 x daily - 7 x weekly - 2 reps - 20 seconds hold Doorway Rhomboid Stretch - 2 x daily - 7 x weekly - 2 reps - 20 seconds hold Standing Upper Trapezius Mobilization with Small Newman Pies

## 2020-05-29 NOTE — Therapy (Addendum)
[facility] Outpatient Rehabilitation [facility] [address] [place], [place], [address] Phone: [phone]   Fax:  [phone]  Physical Therapy Treatment / ERO  Progress Note Reporting Period 04/30/2020 to 05/28/2020  See note below for Objective Data and Assessment of Progress/Goals.    Patient Details  Name: Courtney [name] MRN: [id number] Date of Birth: [birth year] Referring Provider (PT): Levert Feinstein, MD   Encounter Date: 05/28/2020   PT End of Session - 05/28/20 1636    Visit Number 2    Number of Visits 8    Date for PT Re-Evaluation 07/09/20    Authorization Type [employer] of [place]    PT Start Time 1629   patient arrived late   PT Stop Time 1700    PT Time Calculation (min) 31 min    Activity Tolerance Patient tolerated treatment well;Patient limited by pain    Behavior During Therapy [facility] for tasks assessed/performed           Past Medical History:  Diagnosis Date  . Back pain   . Eczema   . Gall stones     Past Surgical History:  Procedure Laterality Date  . gallstones removed      There were no vitals filed for this visit.   Subjective Assessment - 05/28/20 1633    Subjective Patient reports that she is feeling better but still having some pain. Pain will get worse when she starts stretching.    Limitations Sitting;Lifting;House hold activities    How long can you sit comfortably? 20 minutes    How long can you stand comfortably? No limitation    How long can you walk comfortably? No limitation    Patient Stated Goals Get rid of upper back pain    Currently in Pain? Yes    Pain Score 8     Pain Location Back    Pain Orientation Upper;Mid    Pain Descriptors / Indicators Aching;Sharp    Pain Type Chronic pain    Pain Onset More than a month ago    Pain Frequency Intermittent    Aggravating Factors  Sitting, bending, turning, driving    Pain Relieving Factors Nothing, medicine doesn't work    Effect of Pain on Daily Activities Patient  limited with sitting, driving, lifting, carrying, reaching overhead              [facility] PT Assessment - 05/29/20 0001      Assessment   Medical Diagnosis Upper back pain    Referring Provider (PT) Levert Feinstein, MD    Onset Date/Surgical Date --   patient reports this has been going on for years   Next MD Visit 07/09/2020      Precautions   Precautions None      Restrictions   Weight Bearing Restrictions No      Balance Screen   Has the patient fallen in the past 6 months No      Prior Function   Level of Independence Independent    Vocation Full time employment    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, cook, get dressed      Observation/Other Assessments   Focus on Therapeutic Outcomes (FOTO)  NA - MCD      Posture/Postural Control   Posture Comments Patient exhibits rounded shoulder and forward head posture, no significant scapular winging or scoliosis noted      AROM   Overall AROM Comments Cervical and shoulder AROM grossly WFL, patient does report left shoulder  motion painful and difficult      Strength   Overall Strength Comments Periscapular strength grossly 4-/5 MMT with increased pain during all muscle testing    Right Shoulder Flexion 4/5    Right Shoulder Extension 4/5    Right Shoulder ABduction 4/5    Right Shoulder External Rotation 4/5    Left Shoulder Flexion 4/5    Left Shoulder Extension 4/5    Left Shoulder ABduction 4/5    Left Shoulder External Rotation 4/5      Palpation   Palpation comment TTP to periscapular musculature between medial border of scapula and spine with trigger points noted, patient unable to tolerate any TPR this visit                         [facility] Adult PT Treatment/Exercise - 05/29/20 0001      Self-Care   Self-Care Posture;Other Self-Care Comments    Posture Seated posture    Other Self-Care Comments  Updated POC      Exercises   Exercises Shoulder      Shoulder Exercises: Seated    External Rotation 10 reps    Theraband Level (Shoulder External Rotation) Level 1 (Yellow)    External Rotation Limitations double ER      Shoulder Exercises: Standing   Row 10 reps    Theraband Level (Shoulder Row) Level 1 (Yellow)    Row Limitations cueing required to avoid shrug    Other Standing Exercises Self TPR using tennis ball against wall to tolerance for periscapular region       Shoulder Exercises: Stretch   Corner Stretch Limitations Child's pose stretch 2x20 sec    Cross Chest Stretch Limitations Rhomboid stretch in doorway 2x20 sec each    Other Shoulder Stretches Sidelying thoracic rotation x10 each with 3 sec hold    Other Shoulder Stretches Cat Camel x10 - extension to tolerance                  PT Education - 05/29/20 0813    Education Details POC update, HEP update    Person(s) Educated Patient    Methods Explanation;Demonstration;Tactile cues;Verbal cues;Handout    Comprehension Verbalized understanding;Returned demonstration;Verbal cues required;Tactile cues required;Need further instruction            PT Short Term Goals - 05/29/20 0820      PT SHORT TERM GOAL #1   Title Patient will be I with initial HEP to progress with PT    Baseline Patient continues to require cueing for HEP - 05/29/2020    Time 3    Period Weeks    Status On-going    Target Date 06/18/20      PT SHORT TERM GOAL #2   Title Patient will exhibit full shoulder AROM bilaterally to improve dressing and self care tasks    Baseline Patient exhibits AROM WFL - 05/29/2020    Time 3    Period Weeks    Status Achieved    Target Date 06/18/20      PT SHORT TERM GOAL #3   Title Patient will report resting upper back pain level to </= 4/10 to improve sitting ability    Baseline Patient reports resting pain level 8/10 this visit - 05/29/2020    Time 3    Period Weeks    Status On-going    Target Date 06/18/20      PT SHORT TERM GOAL #4   Title Patient  will be able to  demonstrate appropriate seated posture to reduce increased stress and discomfort of upper back    Baseline Patient continues to exhibit rounded shoulder and forward head posture - 05/29/2020    Time 3    Period Weeks    Status On-going    Target Date 06/18/20             PT Long Term Goals - 05/29/20 0822      PT LONG TERM GOAL #1   Title Patient will be I with final HEP to maintain progress from PT    Baseline Updating HEP    Time 6    Period Weeks    Status On-going    Target Date 07/09/20      PT LONG TERM GOAL #2   Title Patient will demonstrate improved periscapular strength to 4+/5 MMT bilaterally to improve postural control and reduce pain    Baseline Patient exhibits grossly 4-/5 MMT periscapular strength    Time 6    Period Weeks    Status On-going    Target Date 07/09/20      PT LONG TERM GOAL #3   Title Patient will be able to sit >/= 1 hour with </= 3/10 pain level to improve driving ability    Baseline Patient only able to sit 20 minutes    Time 6    Period Weeks    Status On-going    Target Date 07/09/20      PT LONG TERM GOAL #4   Title Patient will exhibit normalized thoracic mobility to improve lifting and carrying ability    Baseline Patient exhibits limited mobility with increased pain    Time 6    Period Weeks    Status On-going    Target Date 07/09/20                 Plan - 05/29/20 0814    Clinical Impression Statement Patient continues to report pain of periscapular region between the shoulder blades that seems to be muscular in nature. Patient has not attended PT since her evaluation due to work and childcare conflicts. She does exhibit improved strength and shoulder motion compared to her previous visit. She continues to have increased pain with palpation to the rhomboid/trap region and limitations with sitting extended periods. She was provided with and updated exercise program and instructed on using ball for self TPR. She would  benefit from continued skilled PT to reduce pain and improve posture to allow her to sit and perform activities such as lifting and daily tasks without limitation.    Personal Factors and Comorbidities Time since onset of injury/illness/exacerbation;Past/Current Experience    Examination-Activity Limitations Reach Overhead;Sleep;Sit;Lift;Hygiene/Grooming;Dressing;Carry;Caring for Others;Bathing    Examination-Participation Restrictions Cleaning;Community Activity;Driving;Shop;Laundry;Yard Work    PT Frequency 1x / week    PT Duration 6 weeks    PT Treatment/Interventions ADLs/Self Care Home Management;Cryotherapy;Electrical Stimulation;Iontophoresis 4mg /ml Dexamethasone;Moist Heat;Ultrasound;Neuromuscular re-education;Therapeutic exercise;Therapeutic activities;Functional mobility training;Patient/family education;Manual techniques;Dry needling;Passive range of motion;Taping;Spinal Manipulations;Joint Manipulations    PT Next Visit Plan Assess HEP and progress PRN, manual/dry needling for periscapular musculature (traps and rhomboid region), thoracic mobility, periscapular/postural strengthening    PT Home Exercise Plan : sidelying thoracic rotation, cat camel, childs pose stretch, rhomboid stretch in doorway, row with yellow, double ER with yellow, self TPR using ball    Consulted and Agree with Plan of Care Patient          Check all possible CPT codes:      [  x] 97110 (Therapeutic Exercise)  []  92507 (SLP Treatment)  [x]  (Neuro Re-ed)   []  92526 (Swallowing Treatment)   [x]  97116 (Gait Training)   []  (Cognitive Training, 1st 15 minutes) [x]  [id number] (Manual Therapy)   []  97130 (Cognitive Training, each add'l 15 minutes)  [x]  97530 (Therapeutic Activities)  []  Other, List CPT Code ____________    [x]  97535 (Self Care)       [x]  All codes above (97110 - 97535)  []  97012 (Mechanical Traction)  [x]  97014 (E-stim Unattended)  []  97032 (E-stim manual)  [x]  97033 (Ionto)  [x]   97035 (Ultrasound)  []  97016 (Vaso)  []  97760 (Orthotic Fit) []  (Prosthetic Training) []  (Physical Performance Training) []  (Aquatic Therapy) []  [id number] (Canalith Repositioning) []  (Contrast Bath) []  96295 (Paraffin) []  97597 (Wound Care 1st 20 sq cm) []  97598 (Wound Care each add'l 20 sq cm)     Patient will benefit from skilled therapeutic intervention in order to improve the following deficits and impairments:  Decreased range of motion, Pain, Decreased activity tolerance, Decreased strength, Postural dysfunction  Visit Diagnosis: Pain in thoracic spine  Muscle weakness (generalized)  Abnormal posture     Problem List Patient Active Problem List   Diagnosis Date Noted  . Upper back pain 04/08/2020  . Indication for care in labor or delivery 09/27/2013  . Normal delivery 09/27/2013  . Right orbit fracture (HCC) 08/10/2013  . Eczema 04/17/2013  . Mild hyperemesis gravidarum, antepartum 02/19/2013  . General counseling for initiation of other contraceptive measures 01/02/2013  . Anemia, iron deficiency 05/30/2012    , PT, DPT, LAT, ATC 05/29/20  8:27 AM Phone: [phone] Fax: [phone]   [facility] Outpatient Rehabilitation [facility] [address] [place], , [place] Phone: [phone]   Fax:  [phone]  Name: Alorah Mcree MRN: [id number] Date of Birth: 01-Oct-1991

## 2020-06-04 ENCOUNTER — Ambulatory Visit: Payer: Medicaid Other | Admitting: Physical Therapy

## 2020-06-04 ENCOUNTER — Telehealth: Payer: Self-pay | Admitting: Physical Therapy

## 2020-06-04 NOTE — Telephone Encounter (Signed)
Spoke with pt regarding missed appointment today. She reports she forgot about the appointment. I reviewed our attendance policy and that she has one more appointment scheduled and noted the date/ time. If she is unable to make it, to call before her appointment and we can cancel that for her. If she misses that's appointment per policy would be her 3rd no show and she would be discharged. She verbalized understanding.    Kaylum Shrum PT, DPT, LAT, ATC  06/04/20  4:55 PM

## 2020-06-11 ENCOUNTER — Other Ambulatory Visit: Payer: Self-pay

## 2020-06-11 ENCOUNTER — Ambulatory Visit: Payer: Medicaid Other | Attending: Neurology | Admitting: Physical Therapy

## 2020-06-11 DIAGNOSIS — M546 Pain in thoracic spine: Secondary | ICD-10-CM | POA: Diagnosis not present

## 2020-06-11 DIAGNOSIS — R293 Abnormal posture: Secondary | ICD-10-CM | POA: Diagnosis present

## 2020-06-11 DIAGNOSIS — M6281 Muscle weakness (generalized): Secondary | ICD-10-CM | POA: Diagnosis present

## 2020-06-11 NOTE — Patient Instructions (Signed)

## 2020-06-11 NOTE — Therapy (Addendum)
[place], [place], [address] Phone: [phone]   Fax:  [phone]  Physical Therapy Treatment / Discharge  Patient Details  Name: Courtney [name] MRN: [id number] Date of Birth: [birth year] Referring Provider (PT): Marcial Pacas, MD   Encounter Date: 06/11/2020   PT End of Session - 06/11/20 1632    Visit Number 3    Number of Visits 8    Date for PT Re-Evaluation 07/09/20    Authorization Type MCD of [place]    Authorization Time Period 06/04/2020 - 07/15/2020    Authorization - Visit Number 2    Authorization - Number of Visits 6    PT Start Time 7078    PT Stop Time 1713    PT Time Calculation (min) 42 min    Activity Tolerance Patient tolerated treatment well;Patient limited by pain    Behavior During Therapy [facility] for tasks assessed/performed           Past Medical History:  Diagnosis Date  . Back pain   . Eczema   . Gall stones     Past Surgical History:  Procedure Laterality Date  . gallstones removed      There were no vitals filed for this visit.   Subjective Assessment - 06/11/20 1635    Subjective " I am feeling pretty sore in the back, rated at 7/10 which this morning was  9/10"    Currently in Pain? Yes    Pain Score 7     Pain Location Back    Pain Orientation Mid    Pain Descriptors / Indicators Aching;Sharp    Pain Type Chronic pain    Aggravating Factors  sitting, bedning, turning, driving    Pain Relieving Factors nothing.              [facility] PT Assessment - 06/11/20 0001      Assessment   Medical Diagnosis Upper back pain    Referring Provider (PT) Marcial Pacas, MD                         [facility] Adult PT Treatment/Exercise - 06/11/20 0001      Self-Care   Posture effiicient posture in various positions and handout      Neck Exercises: Supine   Neck Retraction 10 reps;5 secs      Lumbar Exercises: Seated   Other Seated Lumbar Exercises anterior pelvic  tilt 1 x 10 holding 3 seconds on dyna-disc      Shoulder Exercises: Seated   Other Seated Exercises scap retraction and ER with green band 2 x 15 seated on dyna disc to promote efficient posture      Shoulder Exercises: ROM/Strengthening   Nustep L5 x 5 min UE/LE      Shoulder Exercises: Stretch   Other Shoulder Stretches rhomboid stretch with hands crossed on door 2 x 30 sec      Manual Therapy   Manual therapy comments MTPR along bil rhomboids, upper trap/ levator scapulae                  PT Education - 06/11/20 1714    Education Details Reviewed HEP and efficient posture / positioning. provided DN handout    Person(s) Educated Patient    Methods Explanation;Verbal cues;Handout    Comprehension Verbalized understanding;Verbal cues required            PT Short Term Goals - 05/29/20 0820  PT SHORT TERM GOAL #1   Title Patient will be I with initial HEP to progress with PT    Baseline Patient continues to require cueing for HEP - 05/29/2020    Time 3    Period Weeks    Status On-going    Target Date 06/18/20      PT SHORT TERM GOAL #2   Title Patient will exhibit full shoulder AROM bilaterally to improve dressing and self care tasks    Baseline Patient exhibits AROM WFL - 05/29/2020    Time 3    Period Weeks    Status Achieved    Target Date 06/18/20      PT SHORT TERM GOAL #3   Title Patient will report resting upper back pain level to </= 4/10 to improve sitting ability    Baseline Patient reports resting pain level 8/10 this visit - 05/29/2020    Time 3    Period Weeks    Status On-going    Target Date 06/18/20      PT SHORT TERM GOAL #4   Title Patient will be able to demonstrate appropriate seated posture to reduce increased stress and discomfort of upper back    Baseline Patient continues to exhibit rounded shoulder and forward head posture - 05/29/2020    Time 3    Period Weeks    Status On-going    Target Date 06/18/20             PT  Long Term Goals - 05/29/20 0822      PT LONG TERM GOAL #1   Title Patient will be I with final HEP to maintain progress from PT    Baseline Updating HEP    Time 6    Period Weeks    Status On-going    Target Date 07/09/20      PT LONG TERM GOAL #2   Title Patient will demonstrate improved periscapular strength to 4+/5 MMT bilaterally to improve postural control and reduce pain    Baseline Patient exhibits grossly 4-/5 MMT periscapular strength    Time 6    Period Weeks    Status On-going    Target Date 07/09/20      PT LONG TERM GOAL #3   Title Patient will be able to sit >/= 1 hour with </= 3/10 pain level to improve driving ability    Baseline Patient only able to sit 20 minutes    Time 6    Period Weeks    Status On-going    Target Date 07/09/20      PT LONG TERM GOAL #4   Title Patient will exhibit normalized thoracic mobility to improve lifting and carrying ability    Baseline Patient exhibits limited mobility with increased pain    Time 6    Period Weeks    Status On-going    Target Date 07/09/20                 Plan - 06/11/20 1721    Clinical Impression Statement pt arrived to treatment reporting she was doing better until she missed her last appointment. Reviewed TPDN which pt was apprehensive, so performed MTPR and provided handout for further information. reviewed efficien tposture and provided associated handout. worked on posterior shoulder strengthening, which she responded well to.    PT Treatment/Interventions ADLs/Self Care Home Management;Cryotherapy;Electrical Stimulation;Iontophoresis 37m/ml Dexamethasone;Moist Heat;Ultrasound;Neuromuscular re-education;Therapeutic exercise;Therapeutic activities;Functional mobility training;Patient/family education;Manual techniques;Dry needling;Passive range of motion;Taping;Spinal Manipulations;Joint Manipulations    PT Next  Visit Plan Assess HEP and progress PRN, manual/dry needling for periscapular musculature  (traps and rhomboid region), thoracic mobility, periscapular/postural strengthening    PT Home Exercise Plan [id number]: sidelying thoracic rotation, cat camel, childs pose stretch, rhomboid stretch in doorway, row with yellow, double ER with yellow, self TPR using ball, posture handout    Consulted and Agree with Plan of Care Patient           Patient will benefit from skilled therapeutic intervention in order to improve the following deficits and impairments:  Decreased range of motion, Pain, Decreased activity tolerance, Decreased strength, Postural dysfunction  Visit Diagnosis: Pain in thoracic spine  Muscle weakness (generalized)  Abnormal posture     Problem List Patient Active Problem List   Diagnosis Date Noted  . Upper back pain 04/08/2020  . Indication for care in labor or delivery 09/27/2013  . Normal delivery 09/27/2013  . Right orbit fracture ([place]) 08/10/2013  . Eczema 04/17/2013  . Mild hyperemesis gravidarum, antepartum 02/19/2013  . General counseling for initiation of other contraceptive measures 01/02/2013  . Anemia, iron deficiency 05/30/2012   Starr Lake PT, DPT, LAT, ATC  06/11/20  5:26 PM      [facility] [address] [place], [place], [address] Phone: [phone]   Fax:  [phone]  Name: Courtney [name] MRN: [id number] Date of Birth: [birth year]   PHYSICAL THERAPY DISCHARGE SUMMARY  Visits from Start of Care: 3  Current functional level related to goals / functional outcomes: See above   Remaining deficits: See above   Education / Equipment: HEP Plan: Patient agrees to discharge.  Patient goals were not met. Patient is being discharged due to not returning since the last visit.  ?????    Hilda Blades, PT, DPT, LAT, ATC 07/21/20  8:55 AM Phone: [phone] Fax: [phone]

## 2020-07-09 ENCOUNTER — Encounter: Payer: Self-pay | Admitting: Neurology

## 2020-07-09 ENCOUNTER — Ambulatory Visit: Payer: Medicaid Other | Admitting: Neurology

## 2020-07-09 NOTE — Progress Notes (Deleted)
PATIENT: Courtney [name] DOB: 1991-09-18  REASON FOR VISIT: follow up HISTORY FROM: patient  HISTORY OF PRESENT ILLNESS: Today 07/09/20  HISTORY Courtney [name] is a 29 year old female, seen in request by her primary care physician Dr. Clearance Coots, Leonette Most for evaluation of upper back pain, initial evaluation was on April 08, 2020.  I reviewed and summarized the referring note.  She used to work at [employer], currently working as a Engineer, agricultural, which require heavy lifting occasionally, she denies difficulty performing her job,  About 3 years ago, while she was working at [employer], which require constant movement, lifting sometimes, she developed upper back pain, between the shoulder blade, deep muscle achy pain, but she denies radiating pain to her upper extremity, she denies gait abnormality  Over the years, she has tried heating pad, over-the-counter ibuprofen, Tylenol without helping, also failed massage therapy, chiropractic therapy  She has never tried physical therapy, she denies bowel bladder incontinence, now has difficulty moving arms, next due to limitation of the pain, muscle stiffness, especially early morning times,  Per patient, x-ray from chiropractor mild exacerbated curvature of the thoracic spine  Update July 09, 2020 SS:   REVIEW OF SYSTEMS: Out of a complete 14 system review of symptoms, the patient complains only of the following symptoms, and all other reviewed systems are negative.  ALLERGIES: No Known Allergies  HOME MEDICATIONS: Outpatient Medications Prior to Visit  Medication Sig Dispense Refill  . cyclobenzaprine (FLEXERIL) 5 MG tablet Take 1 tablet (5 mg total) by mouth at bedtime as needed for muscle spasms. 30 tablet 6  . DULoxetine (CYMBALTA) 60 MG capsule Take 1 capsule (60 mg total) by mouth daily. 30 capsule 12  . meloxicam (MOBIC) 7.5 MG tablet Take 1 tablet (7.5 mg total) by mouth daily as needed for pain. 30 tablet 5  .  naproxen (NAPROSYN) 500 MG tablet Take 1 tablet (500 mg total) by mouth 2 (two) times daily. (Patient not taking: Reported on 04/08/2020) 30 tablet 0   No facility-administered medications prior to visit.    PAST MEDICAL HISTORY: Past Medical History:  Diagnosis Date  . Back pain   . Eczema   . Gall stones     PAST SURGICAL HISTORY: Past Surgical History:  Procedure Laterality Date  . gallstones removed      FAMILY HISTORY: Family History  Problem Relation Age of Onset  . Diabetes Mother   . Healthy Father     SOCIAL HISTORY: Social History   Socioeconomic History  . Marital status: Single    Spouse name: Not on file  . Number of children: 3  . Years of education: 58  . Highest education level: High school graduate  Occupational History  . 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 Smoking status: Current Every Day Smoker    Packs/day: 0.25    Years: 2.00    Pack years: 0.50    Types: Cigarettes  . Smokeless tobacco: Never Used  Vaping Use  . Vaping Use: Never used  Substance and Sexual Activity  . Alcohol use: Yes    Comment: every 2-3 days  . Drug use: No  . Sexual activity: Yes    Partners: Male    Birth control/protection: Condom, Injection  Other Topics Concern  . Not on file  Social History Narrative   Lives at home with children.   Right-handed.   No daily use of caffeine.   Social Determinants of Corporate investment banker  Strain:   . Difficulty of Paying Living Expenses: Not on file  Food Insecurity:   . Worried About Programme researcher, broadcasting/film/video in the Last Year: Not on file  . Ran Out of Food in the Last Year: Not on file  Transportation Needs:   . Lack of Transportation (Medical): Not on file  . Lack of Transportation (Non-Medical): Not on file  Physical Activity:   . Days of Exercise per Week: Not on file  . Minutes of Exercise per Session: Not on file  Stress:   . Feeling of Stress : Not on file  Social Connections:     . Frequency of Communication with Friends and Family: Not on file  . Frequency of Social Gatherings with Friends and Family: Not on file  . Attends Religious Services: Not on file  . Active Member of Clubs or Organizations: Not on file  . Attends Banker Meetings: Not on file  . Marital Status: Not on file  Intimate Partner Violence:   . Fear of Current or Ex-Partner: Not on file  . Emotionally Abused: Not on file  . Physically Abused: Not on file  . Sexually Abused: Not on file      PHYSICAL EXAM  There were no vitals filed for this visit. There is no height or weight on file to calculate BMI.  Generalized: Well developed, in no acute distress   Neurological examination  Mentation: Alert oriented to time, place, history taking. Follows all commands speech and language fluent Cranial nerve II-XII: Pupils were equal round reactive to light. Extraocular movements were full, visual field were full on confrontational test. Facial sensation and strength were normal. Uvula tongue midline. Head turning and shoulder shrug  were normal and symmetric. Motor: The motor testing reveals 5 over 5 strength of all 4 extremities. Good symmetric motor tone is noted throughout.  Sensory: Sensory testing is intact to soft touch on all 4 extremities. No evidence of extinction is noted.  Coordination: Cerebellar testing reveals good finger-nose-finger and heel-to-shin bilaterally.  Gait and station: Gait is normal. Tandem gait is normal. Romberg is negative. No drift is seen.  Reflexes: Deep tendon reflexes are symmetric and normal bilaterally.   DIAGNOSTIC DATA (LABS, IMAGING, TESTING) - I reviewed patient records, labs, notes, testing and imaging myself where available.  Lab Results  Component Value Date   WBC 12.3 (H) 12/14/2018   HGB 13.6 12/14/2018   HCT 41.7 12/14/2018   MCV 96.1 12/14/2018   PLT 258 12/14/2018      Component Value Date/Time   NA 138 12/14/2018 1639   K  3.6 12/14/2018 1639   CL 106 12/14/2018 1639   CO2 23 12/14/2018 1639   GLUCOSE 80 12/14/2018 1639   BUN <5 (L) 12/14/2018 1639   CREATININE 0.72 12/14/2018 1639   CALCIUM 10.0 12/14/2018 1639   PROT 8.0 12/14/2018 1639   ALBUMIN 4.5 12/14/2018 1639   AST 21 12/14/2018 1639   ALT 14 12/14/2018 1639   ALKPHOS 70 12/14/2018 1639   BILITOT 0.8 12/14/2018 1639   GFRNONAA >60 12/14/2018 1639   GFRAA >60 12/14/2018 1639   No results found for: CHOL, HDL, LDLCALC, LDLDIRECT, TRIG, CHOLHDL No results found for: NWGN5A No results found for: VITAMINB12 No results found for: TSH    ASSESSMENT AND PLAN 29 y.o. year old female  has a past medical history of Back pain, Eczema, and Gall stones. here with:  1. Upper back pain -Continue Mobic and Flexeril as needed -  Continue Cymbalta 60 mg daily   I spent 15 minutes with the patient. 50% of this time was spent   Margie Ege, [place], DNP 07/09/2020, 5:50 AM [facility] [address], [address] [place], [place] [address] [phone]

## 2020-07-10 ENCOUNTER — Telehealth: Payer: Self-pay | Admitting: Neurology

## 2020-07-10 ENCOUNTER — Encounter: Payer: Self-pay | Admitting: Neurology

## 2020-07-10 ENCOUNTER — Ambulatory Visit (INDEPENDENT_AMBULATORY_CARE_PROVIDER_SITE_OTHER): Payer: Medicaid Other | Admitting: Neurology

## 2020-07-10 VITALS — BP 110/68 | HR 95 | Ht 64.0 in | Wt 147.6 lb

## 2020-07-10 DIAGNOSIS — M549 Dorsalgia, unspecified: Secondary | ICD-10-CM

## 2020-07-10 MED ORDER — MELOXICAM 7.5 MG PO TABS: 7.5000 mg | ORAL_TABLET | Freq: Every day | ORAL | 5 refills | Status: DC | PRN

## 2020-07-10 MED ORDER — CYCLOBENZAPRINE HCL 5 MG PO TABS: 5.0000 mg | ORAL_TABLET | Freq: Two times a day (BID) | ORAL | 6 refills | Status: DC | PRN

## 2020-07-10 NOTE — Telephone Encounter (Signed)
Medicaid order sent to [facility]. They will reach out to the patient to schedule.  

## 2020-07-10 NOTE — Progress Notes (Signed)
PATIENT: [name] DOB: 05-11-1991  REASON FOR VISIT: follow up HISTORY FROM: patient  HISTORY OF PRESENT ILLNESS: Today 07/10/20  HISTORY  Alita Waldren is a 29 year old female, seen in request by her primary care physician Dr. Clearance Coots, Leonette Most for evaluation of upper back pain, initial evaluation was on April 08, 2020.  I reviewed and summarized the referring note.  She used to work at [employer], currently working as a Engineer, agricultural, which require heavy lifting occasionally, she denies difficulty performing her job,  About 3 years ago, while she was working at [employer], which require constant movement, lifting sometimes, she developed upper back pain, between the shoulder blade, deep muscle achy pain, but she denies radiating pain to her upper extremity, she denies gait abnormality  Over the years, she has tried heating pad, over-the-counter ibuprofen, Tylenol without helping, also failed massage therapy, chiropractic therapy  She has never tried physical therapy, she denies bowel bladder incontinence, now has difficulty moving arms, next due to limitation of the pain, muscle stiffness, especially early morning times,  Per patient, x-ray from chiropractor 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 Pain has been present for at least a year, no identifiable injury, unable to determine etiology, but isn't improving.  REVIEW OF SYSTEMS: Out of a complete 14 system review of symptoms, the patient complains only of the following symptoms, and all other reviewed systems are negative.  Back  pain  ALLERGIES: No Known Allergies  HOME MEDICATIONS: Outpatient Medications Prior to Visit  Medication Sig Dispense Refill  . cyclobenzaprine (FLEXERIL) 5 MG tablet Take 1 tablet (5 mg total) by mouth at bedtime as needed for muscle spasms. 30 tablet 6  . DULoxetine (CYMBALTA) 60 MG capsule Take 1 capsule (60 mg total) by mouth daily. 30 capsule 12  . meloxicam (MOBIC) 7.5 MG tablet Take 1 tablet (7.5 mg total) by mouth daily as needed for pain. 30 tablet 5  . naproxen (NAPROSYN) 500 MG tablet Take 1 tablet (500 mg total) by mouth 2 (two) times daily. (Patient not taking: Reported on 04/08/2020) 30 tablet 0   No facility-administered medications prior to visit.    PAST MEDICAL HISTORY: Past Medical History:  Diagnosis Date  . Back pain   . Eczema   . Gall stones     PAST SURGICAL HISTORY: Past Surgical History:  Procedure Laterality Date  . gallstones removed      FAMILY HISTORY: Family History  Problem Relation Age of Onset  . Diabetes Mother   . Healthy Father     SOCIAL HISTORY: Social History   Socioeconomic History  . Marital status: Single    Spouse name: Not on file  . Number of children: 3  . Years of education: 55  . Highest education level: High school graduate  Occupational History  . 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 Smoking status: Current Every Day Smoker    Packs/day: 0.25    Years: 2.00    Pack years: 0.50    Types: Cigarettes  . Smokeless tobacco: Never Used  Vaping Use  . Vaping Use: Never used  Substance  and Sexual Activity  . Alcohol use: Yes    Comment: every 2-3 days  . Drug use: No  . Sexual activity: Yes    Partners: Male    Birth control/protection: Condom, Injection  Other Topics Concern  . Not on file  Social History Narrative   Lives at home with children.   Right-handed.   No daily use of caffeine.   Social Determinants of Health   Financial Resource Strain:   . Difficulty of Paying  Living Expenses: Not on file  Food Insecurity:   . Worried About Programme researcher, broadcasting/film/video in the Last Year: Not on file  . Ran Out of Food in the Last Year: Not on file  Transportation Needs:   . Lack of Transportation (Medical): Not on file  . Lack of Transportation (Non-Medical): Not on file  Physical Activity:   . Days of Exercise per Week: Not on file  . Minutes of Exercise per Session: Not on file  Stress:   . Feeling of Stress : Not on file  Social Connections:   . Frequency of Communication with Friends and Family: Not on file  . Frequency of Social Gatherings with Friends and Family: Not on file  . Attends Religious Services: Not on file  . Active Member of Clubs or Organizations: Not on file  . Attends Banker Meetings: Not on file  . Marital Status: Not on file  Intimate Partner Violence:   . Fear of Current or Ex-Partner: Not on file  . Emotionally Abused: Not on file  . Physically Abused: Not on file  . Sexually Abused: Not on file   PHYSICAL EXAM  Vitals:   07/10/20 1441  BP: 110/68  Pulse: 95  Weight: 147 lb 9.6 oz (67 kg)  Height: 5\' 4"  (1.626 m)   Body mass index is 25.34 kg/m.  Generalized: Well developed, in no acute distress   Neurological examination  Mentation: Alert oriented to time, place, history taking. Follows all commands speech and language fluent Cranial nerve II-XII: Pupils were equal round reactive to light. Extraocular movements were full, visual field were full on confrontational test. Facial sensation and strength were normal. Head turning and shoulder shrug  were normal and symmetric. Motor: The motor testing reveals 5 over 5 strength of all 4 extremities. Good symmetric motor tone is noted throughout. Upper thoracic area pain, with deep palpation. Sensory: Sensory testing is intact to soft touch on all 4 extremities. No evidence of extinction is noted.  Coordination: Cerebellar testing reveals good finger-nose-finger and  heel-to-shin bilaterally.  Gait and station: Gait is normal.   Reflexes: Deep tendon reflexes are symmetric and normal bilaterally.   DIAGNOSTIC DATA (LABS, IMAGING, TESTING) - I reviewed patient records, labs, notes, testing and imaging myself where available.  Lab Results  Component Value Date   WBC 12.3 (H) 12/14/2018   HGB 13.6 12/14/2018   HCT 41.7 12/14/2018   MCV 96.1 12/14/2018   PLT 258 12/14/2018      Component Value Date/Time   NA 138 12/14/2018 1639   K 3.6 12/14/2018 1639   CL 106 12/14/2018 1639   CO2 23 12/14/2018 1639   GLUCOSE 80 12/14/2018 1639   BUN <5 (L) 12/14/2018 1639   CREATININE 0.72 12/14/2018 1639   CALCIUM 10.0 12/14/2018 1639   PROT 8.0 12/14/2018 1639   ALBUMIN 4.5 12/14/2018 1639   AST 21 12/14/2018 1639   ALT 14 12/14/2018 1639   ALKPHOS 70 12/14/2018 1639  BILITOT 0.8 12/14/2018 1639   GFRNONAA >60 12/14/2018 1639   GFRAA >60 12/14/2018 1639   No results found for: CHOL, HDL, LDLCALC, LDLDIRECT, TRIG, CHOLHDL No results found for: TGGY6R No results found for: VITAMINB12 No results found for: TSH  ASSESSMENT AND PLAN 29 y.o. year old female  has a past medical history of Back pain, Eczema, and Gall stones. here with:  1. Upper thoracic back pain -No benefit with physical therapy, or medications -Will order MRI of thoracic spine, pain has been present for at least a year, no improvement -Increase Flexeril 5 mg twice daily as needed -Continue meloxicam as needed, continue Cymbalta 60 mg daily -Follow-up in 6 months or sooner if needed  I spent 20 minutes of face-to-face and non-face-to-face time with patient.  This included previsit chart review, lab review, study review, order entry, electronic health record documentation, patient education.  Margie Ege, AGNP-C, DNP 07/10/2020, 2:53 PM [facility] [address], [address] [place], [place] [address] [phone]

## 2020-07-10 NOTE — Patient Instructions (Addendum)
Continue current medications but will increase Flexeril to 5 mg twice daily as needed I will order MRI of thoracic spine See you back in 6 months

## 2020-07-18 ENCOUNTER — Inpatient Hospital Stay: Admission: RE | Admit: 2020-07-18 | Payer: Medicaid Other | Source: Ambulatory Visit

## 2020-07-27 ENCOUNTER — Other Ambulatory Visit: Payer: Medicaid Other

## 2020-09-01 NOTE — Progress Notes (Signed)
I have reviewed and agreed above plan. 

## 2020-10-21 ENCOUNTER — Encounter (HOSPITAL_COMMUNITY): Payer: Self-pay

## 2020-10-21 ENCOUNTER — Emergency Department (HOSPITAL_COMMUNITY)
Admission: EM | Admit: 2020-10-21 | Discharge: 2020-10-21 | Disposition: A | Discharge status: Home / Self Care | Payer: Medicaid Other | Attending: Emergency Medicine | Admitting: Emergency Medicine

## 2020-10-21 ENCOUNTER — Other Ambulatory Visit: Payer: Self-pay

## 2020-10-21 DIAGNOSIS — F17210 Nicotine dependence, cigarettes, uncomplicated: Secondary | ICD-10-CM | POA: Diagnosis not present

## 2020-10-21 DIAGNOSIS — U071 COVID-19: Secondary | ICD-10-CM | POA: Insufficient documentation

## 2020-10-21 DIAGNOSIS — R509 Fever, unspecified: Secondary | ICD-10-CM | POA: Diagnosis present

## 2020-10-21 MED ORDER — ONDANSETRON HCL 4 MG PO TABS: 4.0000 mg | ORAL_TABLET | Freq: Three times a day (TID) | ORAL | 0 refills | Status: DC | PRN

## 2020-10-21 MED ORDER — ALBUTEROL SULFATE HFA 108 (90 BASE) MCG/ACT IN AERS
1.0000 | INHALATION_SPRAY | Freq: Once | RESPIRATORY_TRACT | Status: AC
  Administered 2020-10-21: 2 via RESPIRATORY_TRACT
  Filled 2020-10-21: qty 6.7

## 2020-10-21 NOTE — Discharge Instructions (Signed)
You have been seen here for URI like symptoms.  I have given you a prescription for Zofran please use as needed for nausea.  I have also given you an inhaler please use every 4 hours as needed for shortness of breath.  Please be aware this can increase your heart rate.  I recommend taking Tylenol for fever control and ibuprofen for pain control please follow dosing on the back of bottle.  I recommend staying hydrated and if you do not an appetite, I recommend soups as this will provide you with fluids and calories.  Your Covid test is pending I recommend self quarantine until you get your results back on MyChart.    you are Covid positive you must self quarantine for 5 days starting on symptom onset, if at the end of those 5 days you are feeling better you may return back to school/work, if you continue to have symptoms you must self quarantine for additional 5 days.  I would like you to contact "post Covid care" as they will provide you with information how to manage your Covid symptoms  Come back to the emergency department if you develop chest pain, shortness of breath, severe abdominal pain, uncontrolled nausea, vomiting, diarrhea.

## 2020-10-21 NOTE — ED Triage Notes (Signed)
Pt presents with c/o covid positive. Pt reports she still does not feel good since her diagnosis last week. Pt reports she has body aches and decreased appetite.

## 2020-10-21 NOTE — ED Provider Notes (Signed)
[facility]   CSN: [id number] Arrival date & time: 10/21/20  1116     History Chief Complaint  Patient presents with  . Covid Positive    Courtney [name] is a 30 y.o. female.  HPI   Patient with no significant medical history presents to the emergency department with chief complaint of URI-like symptoms.  Patient endorses fevers, chills, nasal congestion, fatigue, abdominal pain, nausea, vomiting, diarrhea, general body aches.  Patient endorses that she was diagnosed with COVID 1 week ago and has been experiencing some since then.  She states she has been unable to tolerate p.o. and feels like she is dehydrated.  She has not taking any over-the-counter medications.  Patient has no history of DVTs or PEs, is not on hormone therapy no recent surgeries or long immobilizations.  She denies any alleviating factors.  Patient is not immunocompromise and is not vaccinated against COVID-19.  Past Medical History:  Diagnosis Date  . Back pain   . Eczema   . Gall stones     Patient Active Problem List   Diagnosis Date Noted  . Upper back pain 04/08/2020  . Indication for care in labor or delivery 09/27/2013  . Normal delivery 09/27/2013  . Right orbit fracture (HCC) 08/10/2013  . Eczema 04/17/2013  . Mild hyperemesis gravidarum, antepartum 02/19/2013  . General counseling for initiation of other contraceptive measures 01/02/2013  . Anemia, iron deficiency 05/30/2012    Past Surgical History:  Procedure Laterality Date  . gallstones removed       OB History    Gravida  3   Para  3   Term  3   Preterm  0   AB  0   Living  3     SAB  0   IAB  0   Ectopic  0   Multiple  0   Live Births  3           Family History  Problem Relation Age of Onset  . Diabetes Mother   . Healthy Father     Social History   Tobacco Use  . Smoking status: Current Every Day Smoker    Packs/day: 0.25    Years: 2.00    Pack  years: 0.50    Types: Cigarettes  . Smokeless tobacco: Never Used  Vaping Use  . Vaping Use: Never used  Substance Use Topics  . Alcohol use: Yes    Comment: every 2-3 days  . Drug use: No    Home Medications Prior to Admission medications   Medication Sig Start Date End Date Taking? Authorizing Provider  ondansetron (ZOFRAN) 4 MG tablet Take 1 tablet (4 mg total) by mouth every 8 (eight) hours as needed for nausea or vomiting. 10/21/20  Yes Carroll Sage, PA-C  cyclobenzaprine (FLEXERIL) 5 MG tablet Take 1 tablet (5 mg total) by mouth 2 (two) times daily as needed for muscle spasms. 07/10/20   Glean Salvo, NP  DULoxetine (CYMBALTA) 60 MG capsule Take 1 capsule (60 mg total) by mouth daily. 04/08/20   Levert Feinstein, MD  meloxicam (MOBIC) 7.5 MG tablet Take 1 tablet (7.5 mg total) by mouth daily as needed for pain. 07/10/20   Glean Salvo, NP    Allergies    Patient has no known allergies.  Review of Systems   Review of Systems  Constitutional: Positive for appetite change, chills, fatigue and fever.  HENT: Positive for congestion. Negative for sneezing  and sore throat.   Respiratory: Negative for cough and shortness of breath.   Cardiovascular: Negative for chest pain.  Gastrointestinal: Positive for diarrhea, nausea and vomiting. Negative for abdominal pain.  Genitourinary: Negative for dysuria, enuresis and flank pain.  Musculoskeletal: Positive for myalgias. Negative for back pain.  Skin: Negative for rash.  Neurological: Positive for headaches. Negative for dizziness.  Hematological: Does not bruise/bleed easily.    Physical Exam Updated Vital Signs BP 110/82   Pulse 82   Temp 99.9 F (37.7 C) (Oral)   Resp 18   Ht 5\' 4"  (1.626 m)   Wt 63.5 kg   LMP 09/10/2020 (Approximate)   SpO2 100%   BMI 24.03 kg/m   Physical Exam Vitals and nursing note reviewed.  Constitutional:      General: She is not in acute distress.    Appearance: She is not ill-appearing.   HENT:     Head: Normocephalic and atraumatic.     Right Ear: Tympanic membrane, ear canal and external ear normal.     Left Ear: Tympanic membrane, ear canal and external ear normal.     Nose: Congestion present.     Comments: Patient has nasal congestion present, erythematous turbinates bilaterally.    Mouth/Throat:     Mouth: Mucous membranes are moist.     Pharynx: Oropharynx is clear. No oropharyngeal exudate or posterior oropharyngeal erythema.  Eyes:     Conjunctiva/sclera: Conjunctivae normal.  Cardiovascular:     Rate and Rhythm: Normal rate and regular rhythm.     Pulses: Normal pulses.     Heart sounds: No murmur heard. No friction rub. No gallop.   Pulmonary:     Effort: No respiratory distress.     Breath sounds: No wheezing, rhonchi or rales.  Abdominal:     Palpations: Abdomen is soft.     Tenderness: There is no abdominal tenderness.  Musculoskeletal:     Comments: Patient is moving all 4 extremities out difficulty.  Skin:    General: Skin is warm and dry.  Neurological:     Mental Status: She is alert.  Psychiatric:        Mood and Affect: Mood normal.     ED Results / Procedures / Treatments   Labs (all labs ordered are listed, but only abnormal results are displayed) Labs Reviewed - No data to display  EKG None  Radiology No results found.  Procedures Procedures (including critical care time)  Medications Ordered in ED Medications  albuterol (VENTOLIN HFA) 108 (90 Base) MCG/ACT inhaler 1-2 puff (has no administration in time range)    ED Course  I have reviewed the triage vital signs and the nursing notes.  Pertinent labs & imaging results that were available during my care of the patient were reviewed by me and considered in my medical decision making (see chart for details).    MDM Rules/Calculators/A&P                          Patient presents with URI-like symptoms.  She is alert, does not appear in acute distress, vital signs  reassuring.  Due to well-appearing patient, benign physical exam further  lab and imaging not warranted at this time.   Low suspicion for systemic infection as patient is nontoxic-appearing, vital signs reassuring, no obvious source infection noted on exam.  Low suspicion for pneumonia as lung sounds are clear bilaterally, will defer imaging at this time.  I have  low suspicion for PE as patient denies pleuritic chest pain, shortness of breath, patient is PERC. low suspicion for strep throat as oropharynx was visualized, no erythema or exudates noted.  Low suspicion patient would need  hospitalized due to Covid as vital signs reassuring, patient is not in respiratory distress.  Patient does not meet criteria for infusion, will recommend over-the-counter pain medications, provide her with Zofran for nausea, albuterol for chest tightness, and recommend she follows up post COVID care.  Vital signs have remained stable, no indication for hospital admission. Patient given at home care as well strict return precautions.  Patient verbalized that they understood agreed to said plan.   Final Clinical Impression(s) / ED Diagnoses Final diagnoses:  COVID    Rx / DC Orders ED Discharge Orders         Ordered    ondansetron (ZOFRAN) 4 MG tablet  Every 8 hours PRN        10/21/20 1836           Carroll Sage, PA-C 10/21/20 Herbie [place]    Cathren Laine, MD 10/21/20 1918

## 2020-10-24 ENCOUNTER — Telehealth: Payer: Self-pay | Admitting: Nurse Practitioner

## 2020-10-24 NOTE — Telephone Encounter (Signed)
[facility] [phone]) called pt for HFU. Unable to leave msg per recording "not able to take calls at this time. Pt is 10 days post ED visit.

## 2020-10-25 ENCOUNTER — Inpatient Hospital Stay (HOSPITAL_COMMUNITY): Payer: Medicaid Other

## 2020-10-25 ENCOUNTER — Encounter (HOSPITAL_COMMUNITY): Payer: Self-pay

## 2020-10-25 ENCOUNTER — Other Ambulatory Visit: Payer: Self-pay

## 2020-10-25 ENCOUNTER — Inpatient Hospital Stay (HOSPITAL_COMMUNITY)
Admission: AD | Admit: 2020-10-25 | Discharge: 2020-10-25 | Disposition: A | Discharge status: Home / Self Care | Payer: Medicaid Other | Attending: Obstetrics & Gynecology | Admitting: Obstetrics & Gynecology

## 2020-10-25 DIAGNOSIS — O99331 Smoking (tobacco) complicating pregnancy, first trimester: Secondary | ICD-10-CM | POA: Insufficient documentation

## 2020-10-25 DIAGNOSIS — O210 Mild hyperemesis gravidarum: Secondary | ICD-10-CM | POA: Insufficient documentation

## 2020-10-25 DIAGNOSIS — O26891 Other specified pregnancy related conditions, first trimester: Secondary | ICD-10-CM

## 2020-10-25 DIAGNOSIS — O26899 Other specified pregnancy related conditions, unspecified trimester: Secondary | ICD-10-CM

## 2020-10-25 DIAGNOSIS — O98511 Other viral diseases complicating pregnancy, first trimester: Secondary | ICD-10-CM | POA: Insufficient documentation

## 2020-10-25 DIAGNOSIS — F17210 Nicotine dependence, cigarettes, uncomplicated: Secondary | ICD-10-CM | POA: Insufficient documentation

## 2020-10-25 DIAGNOSIS — Z3A01 Less than 8 weeks gestation of pregnancy: Secondary | ICD-10-CM | POA: Diagnosis not present

## 2020-10-25 DIAGNOSIS — R109 Unspecified abdominal pain: Secondary | ICD-10-CM | POA: Insufficient documentation

## 2020-10-25 DIAGNOSIS — U071 COVID-19: Secondary | ICD-10-CM | POA: Diagnosis not present

## 2020-10-25 DIAGNOSIS — Z3491 Encounter for supervision of normal pregnancy, unspecified, first trimester: Secondary | ICD-10-CM

## 2020-10-25 LAB — CBC
HCT: 40.9 % (ref 36.0–46.0)
Hemoglobin: 14.2 g/dL (ref 12.0–15.0)
MCH: 31.6 pg (ref 26.0–34.0)
MCHC: 34.7 g/dL (ref 30.0–36.0)
MCV: 91.1 fL (ref 80.0–100.0)
Platelets: 309 10*3/uL (ref 150–400)
RBC: 4.49 MIL/uL (ref 3.87–5.11)
RDW: 11.7 % (ref 11.5–15.5)
WBC: 13.4 10*3/uL — ABNORMAL HIGH (ref 4.0–10.5)
nRBC: 0 % (ref 0.0–0.2)

## 2020-10-25 LAB — POCT PREGNANCY, URINE: Preg Test, Ur: POSITIVE — AB

## 2020-10-25 LAB — URINALYSIS, ROUTINE W REFLEX MICROSCOPIC
Bilirubin Urine: NEGATIVE
Glucose, UA: NEGATIVE mg/dL
Hgb urine dipstick: NEGATIVE
Ketones, ur: 80 mg/dL — AB
Nitrite: NEGATIVE
Protein, ur: 30 mg/dL — AB
Specific Gravity, Urine: 1.029 (ref 1.005–1.030)
pH: 5 (ref 5.0–8.0)

## 2020-10-25 LAB — HCG, QUANTITATIVE, PREGNANCY: hCG, Beta Chain, Quant, S: 123773 m[IU]/mL — ABNORMAL HIGH (ref <5)

## 2020-10-25 LAB — WET PREP, GENITAL
Clue Cells Wet Prep HPF POC: NONE SEEN
Sperm: NONE SEEN
Trich, Wet Prep: NONE SEEN
Yeast Wet Prep HPF POC: NONE SEEN

## 2020-10-25 MED ORDER — PRENATAL 27-0.8 MG PO TABS: 1.0000 | ORAL_TABLET | Freq: Every day | ORAL | 11 refills | Status: DC

## 2020-10-25 MED ORDER — LACTATED RINGERS IV BOLUS
1000.0000 mL | Freq: Once | INTRAVENOUS | Status: AC
  Administered 2020-10-25: 1000 mL via INTRAVENOUS

## 2020-10-25 MED ORDER — FAMOTIDINE 20 MG PO TABS: 20.0000 mg | ORAL_TABLET | Freq: Two times a day (BID) | ORAL | 0 refills | Status: DC

## 2020-10-25 MED ORDER — PROMETHAZINE HCL 25 MG PO TABS: 25.0000 mg | ORAL_TABLET | Freq: Four times a day (QID) | ORAL | 0 refills | Status: DC | PRN

## 2020-10-25 MED ORDER — SCOPOLAMINE 1 MG/3DAYS TD PT72: 1.0000 | MEDICATED_PATCH | TRANSDERMAL | 12 refills | Status: DC

## 2020-10-25 MED ORDER — PROMETHAZINE HCL 25 MG/ML IJ SOLN
25.0000 mg | Freq: Once | INTRAMUSCULAR | Status: AC
  Administered 2020-10-25: 25 mg via INTRAVENOUS
  Filled 2020-10-25: qty 1

## 2020-10-25 MED ORDER — FAMOTIDINE IN NACL 20-0.9 MG/50ML-% IV SOLN
20.0000 mg | Freq: Once | INTRAVENOUS | Status: AC
  Administered 2020-10-25: 20 mg via INTRAVENOUS
  Filled 2020-10-25: qty 50

## 2020-10-25 NOTE — MAU Provider Note (Signed)
History     CSN: [id number]  Arrival date and time: 10/25/20 1105   Event Date/Time   First Provider Initiated Contact with Patient 10/25/20 1148      Chief Complaint  Patient presents with  . Nausea  . Abdominal Pain  . Emesis   HPI Courtney [name] is a 30 y.o. G4P3003 at [redacted]w[redacted]d who presents with nausea, vomiting and abdominal pain. She states for the last 4 days she has been vomiting constantly and has been unable to keep down any food or water. She attempted to eat pineapple yesterday but vomited. She has tried zofran with no relief. She also reports intermittent lower abdominal cramping that she rates a 4/10. She was diagnosed with COVID on 1/4 and states the symptoms have been ongoing since then.   OB History    Gravida  4   Para  3   Term  3   Preterm  0   AB  0   Living  3     SAB  0   IAB  0   Ectopic  0   Multiple  0   Live Births  3           Past Medical History:  Diagnosis Date  . Back pain   . Eczema   . Gall stones     Past Surgical History:  Procedure Laterality Date  . gallstones removed      Family History  Problem Relation Age of Onset  . Diabetes Mother   . Healthy Father     Social History   Tobacco Use  . Smoking status: Current Every Day Smoker    Packs/day: 0.25    Years: 2.00    Pack years: 0.50    Types: Cigarettes  . Smokeless tobacco: Never Used  Vaping Use  . Vaping Use: Never used  Substance Use Topics  . Alcohol use: Yes    Comment: every 2-3 days  . Drug use: No    Allergies: No Known Allergies  Medications Prior to Admission  Medication Sig Dispense Refill Last Dose  . cyclobenzaprine (FLEXERIL) 5 MG tablet Take 1 tablet (5 mg total) by mouth 2 (two) times daily as needed for muscle spasms. 60 tablet 6   . DULoxetine (CYMBALTA) 60 MG capsule Take 1 capsule (60 mg total) by mouth daily. 30 capsule 12   . meloxicam (MOBIC) 7.5 MG tablet Take 1 tablet (7.5 mg total) by mouth daily as needed for pain.  30 tablet 5   . ondansetron (ZOFRAN) 4 MG tablet Take 1 tablet (4 mg total) by mouth every 8 (eight) hours as needed for nausea or vomiting. 12 tablet 0     Review of Systems  Constitutional: Negative.  Negative for fatigue and fever.  HENT: Negative.   Respiratory: Negative.  Negative for shortness of breath.   Cardiovascular: Negative.  Negative for chest pain.  Gastrointestinal: Positive for abdominal pain, nausea and vomiting. Negative for constipation and diarrhea.  Genitourinary: Negative.  Negative for dysuria, vaginal bleeding and vaginal discharge.  Neurological: Negative.  Negative for dizziness and headaches.   Physical Exam   Blood pressure 101/77, pulse (!) 102, temperature 98.1 F (36.7 C), temperature source Oral, resp. rate 20, last menstrual period 09/10/2020, SpO2 99 %.  Physical Exam Vitals and nursing note reviewed.  Constitutional:      General: She is not in acute distress.    Appearance: She is well-developed and well-nourished.  HENT:     Head:  Normocephalic.  Eyes:     Pupils: Pupils are equal, round, and reactive to light.  Cardiovascular:     Rate and Rhythm: Normal rate and regular rhythm.     Heart sounds: Normal heart sounds.  Pulmonary:     Effort: Pulmonary effort is normal. No respiratory distress.     Breath sounds: Normal breath sounds.  Abdominal:     General: Bowel sounds are normal. There is no distension.     Palpations: Abdomen is soft.     Tenderness: There is no abdominal tenderness.  Skin:    General: Skin is warm and dry.  Neurological:     Mental Status: She is alert and oriented to person, place, and time.  Psychiatric:        Mood and Affect: Mood and affect normal.        Behavior: Behavior normal.        Thought Content: Thought content normal.        Judgment: Judgment normal.     MAU Course  Procedures Results for orders placed or performed during the hospital encounter of 10/25/20 (from the past 24 hour(s))   Pregnancy, urine POC     Status: Abnormal   Collection Time: 10/25/20 11:18 AM  Result Value Ref Range   Preg Test, Ur POSITIVE (A) NEGATIVE  Urinalysis, Routine w reflex microscopic Urine, Clean Catch     Status: Abnormal   Collection Time: 10/25/20 11:38 AM  Result Value Ref Range   Color, Urine AMBER (A) YELLOW   APPearance HAZY (A) CLEAR   Specific Gravity, Urine 1.029 1.005 - 1.030   pH 5.0 5.0 - 8.0   Glucose, UA NEGATIVE NEGATIVE mg/dL   Hgb urine dipstick NEGATIVE NEGATIVE   Bilirubin Urine NEGATIVE NEGATIVE   Ketones, ur 80 (A) NEGATIVE mg/dL   Protein, ur 30 (A) NEGATIVE mg/dL   Nitrite NEGATIVE NEGATIVE   Leukocytes,Ua TRACE (A) NEGATIVE   RBC / HPF 0-5 0 - 5 RBC/hpf   WBC, UA 0-5 0 - 5 WBC/hpf   Bacteria, UA RARE (A) NONE SEEN   Squamous Epithelial / LPF 6-10 0 - 5   Mucus PRESENT    Hyaline Casts, UA PRESENT   CBC     Status: Abnormal   Collection Time: 10/25/20 11:53 AM  Result Value Ref Range   WBC 13.4 (H) 4.0 - 10.5 K/uL   RBC 4.49 3.87 - 5.11 MIL/uL   Hemoglobin 14.2 12.0 - 15.0 g/dL   HCT 02.5 42.7 - 06.2 %   MCV 91.1 80.0 - 100.0 fL   MCH 31.6 26.0 - 34.0 pg   MCHC 34.7 30.0 - 36.0 g/dL   RDW 37.6 28.3 - 15.1 %   Platelets 309 150 - 400 K/uL   nRBC 0.0 0.0 - 0.2 %  hCG, quantitative, pregnancy     Status: Abnormal   Collection Time: 10/25/20 11:53 AM  Result Value Ref Range   hCG, Beta Chain, Quant, S 123,773 (H) <5 mIU/mL  Wet prep, genital     Status: Abnormal   Collection Time: 10/25/20 12:45 PM   Specimen: Vaginal  Result Value Ref Range   Yeast Wet Prep HPF POC NONE SEEN NONE SEEN   Trich, Wet Prep NONE SEEN NONE SEEN   Clue Cells Wet Prep HPF POC NONE SEEN NONE SEEN   WBC, Wet Prep HPF POC FEW (A) NONE SEEN   Sperm NONE SEEN    MDM UA, UPT LR bolus Phenergan Pepcid  CBC,  HCG ABO/Rh- O Pos Wet prep and gc/chlamydia US OB Comp Less 14 weeks with Transvaginal  Lengthy discussion with patient regarding diagnosis of subchorionic  hemorrhage and what to expect with bleeding. Reviewed normalcy of some bleeding and when to return to MAU.   Assessment and Plan   1. Normal intrauterine pregnancy on prenatal ultrasound in first trimester   2. Abdominal pain affecting pregnancy   3. [redacted] weeks gestation of pregnancy   4. Morning sickness   5. COVID-19 affecting pregnancy in first trimester    -Discharge home in stable condition -Rx for phenergan, pepcid and scopolamine patches sent to patient's pharmacy -First trimester precautions discussed -Patient advised to follow-up with OB to establish prenatal care -Patient may return to MAU as needed or if her condition were to change or worsen   Rolm Bookbinder CNM 10/25/2020, 11:49 AM

## 2020-10-25 NOTE — MAU Note (Signed)
Courtney [name] is a 30 y.o. at [redacted]w[redacted]d here in MAU reporting: vomiting for the past 6-7 days, states she unable to keep down food or liquids. Emesis x 4 in the past 24 hours. No bleeding. Is having lower abdominal pain. Last used marijuana in dec 2021.  LMP: 09/10/20  Onset of complaint: ongoing  Pain score: 8/10  Vitals:   10/25/20 1123  BP: 101/77  Pulse: (!) 102  Resp: 20  Temp: 98.1 F (36.7 C)  SpO2: 99%     Lab orders placed from triage: UA, UPT

## 2020-10-25 NOTE — Discharge Instructions (Signed)
Prenatal Care Providers           [facility]  [address] [phone]  [facility]   [address]  [phone]  [facility]       [address] [phone]            [facility] [place]     [phone] [place] [phone]          [facility] @ [facility]   [address] [phone]  [facility]  [address] [phone]     [facility] @ [facility] [address] [place]()  [address]   [phone]     [facility]  Phone: [phone]  [facility]  Phone: [phone]  [facility] Phone: [phone]  [facility] Phone: [phone]  [facility] Phone: [phone]  [facility] Phone: [phone]  [facility]  Phone: [phone]  Safe Medications in Pregnancy   Acne: Benzoyl Peroxide Salicylic Acid  Backache/Headache: Tylenol: 2 regular strength every 4 hours OR              2 Extra strength every 6 hours  Colds/Coughs/Allergies: Benadryl (alcohol free) 25 mg every 6 hours as needed Breath right strips Claritin Cepacol throat lozenges Chloraseptic throat spray Cold-Eeze- up to three times per day Cough drops, alcohol free Flonase (by prescription only) Guaifenesin Mucinex Robitussin DM (plain only, alcohol free) Saline nasal spray/drops Sudafed (pseudoephedrine) & Actifed ** use only after [redacted] weeks gestation and if you do not have high blood pressure Tylenol Vicks Vaporub Zinc lozenges Zyrtec   Constipation: Colace Ducolax suppositories Fleet enema Glycerin suppositories Metamucil Milk of magnesia Miralax Senokot Smooth move tea  Diarrhea: Kaopectate Imodium A-D  *NO pepto  Bismol  Hemorrhoids: Anusol Anusol HC Preparation H Tucks  Indigestion: Tums Maalox Mylanta Zantac  Pepcid  Insomnia: Benadryl (alcohol free) 25mg  every 6 hours as needed Tylenol PM Unisom, no Gelcaps  Leg Cramps: Tums MagGel  Nausea/Vomiting:  Bonine Dramamine Emetrol Ginger extract Sea bands Meclizine  Nausea medication to take during pregnancy:  Unisom (doxylamine succinate 25 mg tablets) Take one tablet daily at bedtime. If symptoms are not adequately controlled, the dose can be increased to a maximum recommended dose of two tablets daily (1/2 tablet in the morning, 1/2 tablet mid-afternoon and one at bedtime). Vitamin B6 100mg  tablets. Take one tablet twice a day (up to 200 mg per day).  Skin Rashes: Aveeno products Benadryl cream or 25mg  every 6 hours as needed Calamine Lotion 1% cortisone cream  Yeast infection: Gyne-lotrimin 7 Monistat 7   **If taking multiple medications, please check labels to avoid duplicating the same active ingredients **take medication as directed on the label ** Do not exceed 4000 mg of tylenol in 24 hours **Do not take medications that contain aspirin or ibuprofen     Subchorionic Hematoma  A hematoma is a collection of blood outside of the blood vessels. A subchorionic hematoma is a collection of blood between the outer wall of the embryo (chorion) and the inner wall of the uterus. This condition can cause vaginal bleeding. Early small hematomas usually shrink on their own and do not affect your baby or pregnancy. When bleeding starts later in pregnancy, or if the hematoma  is larger or occurs in older pregnant women, the condition may be more serious. Larger hematomas increase the chances of miscarriage. This condition also increases the risk of:  Premature separation of the placenta from the uterus.  Premature (preterm) labor.  Stillbirth. What are the causes? The exact cause of this condition is not known. It occurs  when blood is trapped between the placenta and the uterine wall because the placenta has separated from the original site of implantation. What increases the risk? You are more likely to develop this condition if:  You were treated with fertility medicines.  You became pregnant through in vitro fertilization (IVF). What are the signs or symptoms? Symptoms of this condition include:  Vaginal spotting or bleeding.  Abdominal pain. This is rare. Sometimes you may have no symptoms and the bleeding may only be seen when ultrasound images are taken (transvaginal ultrasound). How is this diagnosed? This condition is diagnosed based on a physical exam. This includes a pelvic exam. You may also have other tests, including:  Blood tests.  Urine tests.  Ultrasound of the abdomen. How is this treated? Treatment for this condition can vary. Treatment may include:  Watchful waiting. You will be monitored closely for any changes in bleeding.  Medicines.  Activity restriction. This may be needed until the bleeding stops.  A medicine called Rh immunoglobulin. This is given if you have an Rh-negative blood type. It prevents Rh sensitization. Follow these instructions at home:  Stay on bed rest if told to do so by your health care provider.  Do not lift anything that is heavier than 10 lb (4.5 kg), or the limit that you are told by your health care provider.  Track and write down the number of pads you use each day and how soaked (saturated) they are.  Do not use tampons.  Keep all follow-up visits. This is important. Your health care provider may ask you to have follow-up blood tests or ultrasound tests or both. Contact a health care provider if:  You have any vaginal bleeding.  You have a fever. Get help right away if:  You have severe cramps in your stomach, back, abdomen, or pelvis.  You pass large clots or tissue. Save any tissue for your health care provider to look at.  You  faint.  You become light-headed or weak. Summary  A subchorionic hematoma is a collection of blood between the outer wall of the embryo (chorion) and the inner wall of the uterus.  This condition can cause vaginal bleeding.  Sometimes you may have no symptoms and the bleeding may only be seen when ultrasound images are taken.  Treatment may include watchful waiting, medicines, or activity restriction.  Keep all follow-up visits. Get help right away if you have severe cramps or heavy vaginal bleeding. This information is not intended to replace advice given to you by your health care provider. Make sure you discuss any questions you have with your health care provider. Document Revised: 06/23/2020 Document Reviewed: 06/23/2020 Elsevier Patient Education  2021 Elsevier Inc.  Obstetrics: Normal and Problem Pregnancies (7th ed., pp. 102-121). [place], [place]: Elsevier."> Textbook of Family Medicine (9th ed., pp. [phone]). [place]: Elsevier [employer].">  First Trimester of Pregnancy  The first trimester of pregnancy starts on the first day of your last menstrual period until the end of week 12. This is months 1 through 3 of pregnancy. A week after a sperm fertilizes an egg, the egg will implant into the wall of  the uterus and begin to develop into a baby. By the end of 12 weeks, all the baby's organs will be formed and the baby will be 2-3 inches in size. Body changes during your first trimester Your body goes through many changes during pregnancy. The changes vary and generally return to normal after your baby is born. Physical changes  You may gain or lose weight.  Your breasts may begin to grow larger and become tender. The tissue that surrounds your nipples (areola) may become darker.  Dark spots or blotches (chloasma or mask of pregnancy) may develop on your face.  You may have changes in your hair. These can include thickening or thinning of your hair or changes in  texture. Health changes  You may feel nauseous, and you may vomit.  You may have heartburn.  You may develop headaches.  You may develop constipation.  Your gums may bleed and may be sensitive to brushing and flossing. Other changes  You may tire easily.  You may urinate more often.  Your menstrual periods will stop.  You may have a loss of appetite.  You may develop cravings for certain kinds of food.  You may have changes in your emotions from day to day.  You may have more vivid and strange dreams. Follow these instructions at home: Medicines  Follow your health care provider's instructions regarding medicine use. Specific medicines may be either safe or unsafe to take during pregnancy. Do not take any medicines unless told to by your health care provider.  Take a prenatal vitamin that contains at least 600 micrograms (mcg) of folic acid. Eating and drinking  Eat a healthy diet that includes fresh fruits and vegetables, whole grains, good sources of protein such as meat, eggs, or tofu, and low-fat dairy products.  Avoid raw meat and unpasteurized juice, milk, and cheese. These carry germs that can harm you and your baby.  If you feel nauseous or you vomit: ? Eat 4 or 5 small meals a day instead of 3 large meals. ? Try eating a few soda crackers. ? Drink liquids between meals instead of during meals.  You may need to take these actions to prevent or treat constipation: ? Drink enough fluid to keep your urine pale yellow. ? Eat foods that are high in fiber, such as beans, whole grains, and fresh fruits and vegetables. ? Limit foods that are high in fat and processed sugars, such as fried or sweet foods. Activity  Exercise only as directed by your health care provider. Most people can continue their usual exercise routine during pregnancy. Try to exercise for 30 minutes at least 5 days a week.  Stop exercising if you develop pain or cramping in the lower abdomen or  lower back.  Avoid exercising if it is very hot or humid or if you are at high altitude.  Avoid heavy lifting.  If you choose to, you may have sex unless your health care provider tells you not to. Relieving pain and discomfort  Wear a good support bra to relieve breast tenderness.  Rest with your legs elevated if you have leg cramps or low back pain.  If you develop bulging veins (varicose veins) in your legs: ? Wear support hose as told by your health care provider. ? Elevate your feet for 15 minutes, 3-4 times a day. ? Limit salt in your diet. Safety  Wear your seat belt at all times when driving or riding in a car.  Talk with  your health care provider if someone is verbally or physically abusive to you.  Talk with your health care provider if you are feeling sad or have thoughts of hurting yourself. Lifestyle  Do not use hot tubs, steam rooms, or saunas.  Do not douche. Do not use tampons or scented sanitary pads.  Do not use herbal remedies, alcohol, illegal drugs, or medicines that are not approved by your health care provider. Chemicals in these products can harm your baby.  Do not use any products that contain nicotine or tobacco, such as cigarettes, e-cigarettes, and chewing tobacco. If you need help quitting, ask your health care provider.  Avoid cat litter boxes and soil used by cats. These carry germs that can cause birth defects in the baby and possibly loss of the unborn baby (fetus) by miscarriage or stillbirth. General instructions  During routine prenatal visits in the first trimester, your health care provider will do a physical exam, perform necessary tests, and ask you how things are going. Keep all follow-up visits. This is important.  Ask for help if you have counseling or nutritional needs during pregnancy. Your health care provider can offer advice or refer you to specialists for help with various needs.  Schedule a dentist appointment. At home, brush  your teeth with a soft toothbrush. Floss gently.  Write down your questions. Take them to your prenatal visits. Where to find more information  American Pregnancy Association: americanpregnancy.org  [employer] of Obstetricians and Gynecologists: https://www.todd-brady.net/  Office on [employer] Health: MightyReward.co.nz Contact a health care provider if you have:  Dizziness.  A fever.  Mild pelvic cramps, pelvic pressure, or nagging pain in the abdominal area.  Nausea, vomiting, or diarrhea that lasts for 24 hours or longer.  A bad-smelling vaginal discharge.  Pain when you urinate.  Known exposure to a contagious illness, such as chickenpox, measles, Zika virus, HIV, or hepatitis. Get help right away if you have:  Spotting or bleeding from your vagina.  Severe abdominal cramping or pain.  Shortness of breath or chest pain.  Any kind of trauma, such as from a fall or a car crash.  New or increased pain, swelling, or redness in an arm or leg. Summary  The first trimester of pregnancy starts on the first day of your last menstrual period until the end of week 12 (months 1 through 3).  Eating 4 or 5 small meals a day rather than 3 large meals may help to relieve nausea and vomiting.  Do not use any products that contain nicotine or tobacco, such as cigarettes, e-cigarettes, and chewing tobacco. If you need help quitting, ask your health care provider.  Keep all follow-up visits. This is important. This information is not intended to replace advice given to you by your health care provider. Make sure you discuss any questions you have with your health care provider. Document Revised: 03/05/2020 Document Reviewed: 01/10/2020 Elsevier Patient Education  2021 [employer].

## 2020-10-27 LAB — GC/CHLAMYDIA PROBE AMP (~~LOC~~) NOT AT ARMC
Chlamydia: NEGATIVE
Comment: NEGATIVE
Comment: NORMAL
Neisseria Gonorrhea: NEGATIVE

## 2020-11-13 ENCOUNTER — Ambulatory Visit (INDEPENDENT_AMBULATORY_CARE_PROVIDER_SITE_OTHER): Payer: Medicaid Other | Admitting: Obstetrics

## 2020-11-13 VITALS — BP 126/78 | HR 98 | Ht 64.0 in | Wt 139.1 lb

## 2020-11-13 DIAGNOSIS — Z3A01 Less than 8 weeks gestation of pregnancy: Secondary | ICD-10-CM

## 2020-11-15 NOTE — Progress Notes (Signed)
Appointment cancelled

## 2020-12-11 ENCOUNTER — Ambulatory Visit: Payer: Medicaid Other | Admitting: Obstetrics

## 2020-12-30 ENCOUNTER — Ambulatory Visit (INDEPENDENT_AMBULATORY_CARE_PROVIDER_SITE_OTHER): Payer: Medicaid Other | Admitting: Obstetrics

## 2020-12-30 ENCOUNTER — Other Ambulatory Visit (HOSPITAL_COMMUNITY)
Admission: RE | Admit: 2020-12-30 | Discharge: 2020-12-30 | Disposition: A | Discharge status: Home / Self Care | Payer: Medicaid Other | Source: Ambulatory Visit | Attending: Obstetrics | Admitting: Obstetrics

## 2020-12-30 ENCOUNTER — Encounter: Payer: Self-pay | Admitting: Obstetrics

## 2020-12-30 ENCOUNTER — Other Ambulatory Visit: Payer: Self-pay

## 2020-12-30 VITALS — BP 112/71 | HR 82 | Ht 64.5 in | Wt 161.0 lb

## 2020-12-30 DIAGNOSIS — F17210 Nicotine dependence, cigarettes, uncomplicated: Secondary | ICD-10-CM

## 2020-12-30 DIAGNOSIS — N898 Other specified noninflammatory disorders of vagina: Secondary | ICD-10-CM

## 2020-12-30 DIAGNOSIS — Z3009 Encounter for other general counseling and advice on contraception: Secondary | ICD-10-CM

## 2020-12-30 DIAGNOSIS — Z30013 Encounter for initial prescription of injectable contraceptive: Secondary | ICD-10-CM | POA: Diagnosis not present

## 2020-12-30 DIAGNOSIS — Z01419 Encounter for gynecological examination (general) (routine) without abnormal findings: Secondary | ICD-10-CM | POA: Insufficient documentation

## 2020-12-30 DIAGNOSIS — L309 Dermatitis, unspecified: Secondary | ICD-10-CM

## 2020-12-30 DIAGNOSIS — M549 Dorsalgia, unspecified: Secondary | ICD-10-CM

## 2020-12-30 LAB — POCT URINE PREGNANCY: Preg Test, Ur: NEGATIVE

## 2020-12-30 MED ORDER — MEDROXYPROGESTERONE ACETATE 150 MG/ML IM SUSP: 150.0000 mg | INTRAMUSCULAR | 4 refills | Status: DC

## 2020-12-30 MED ORDER — MEDROXYPROGESTERONE ACETATE 150 MG/ML IM SUSP
150.0000 mg | Freq: Once | INTRAMUSCULAR | Status: AC
  Administered 2020-12-30: 150 mg via INTRAMUSCULAR

## 2020-12-30 MED ORDER — HYDROCORTISONE 2.5 % EX CREA: TOPICAL_CREAM | Freq: Two times a day (BID) | CUTANEOUS | 2 refills | Status: DC

## 2020-12-30 NOTE — Progress Notes (Signed)
Pt presents for AEX. Would like to discuss BC options.    Pt had Negative UPT in office today. Per Dr Clearance Coots, to give start dose of depo in office today. Office stock was supplied for today's injection. Pt tolerated injection well. Pt advised to RTO 6/7-6/21 for next depo.  Administrations This Visit    medroxyPROGESTERone (DEPO-PROVERA) injection 150 mg    Admin Date 12/30/2020 Action Given Dose 150 mg Route Intramuscular Administered By Lanney Gins, CMA

## 2020-12-30 NOTE — Progress Notes (Signed)
Subjective:        Courtney [name] is a 30 y.o. female here for a routine exam.  Current complaints: Vaginal discharge.  Also c/o chronic backache.  Has been evaluated and treated by Physical Therapist and Chiropractor, without relief.Patient also requests referral to Dermatologist for eczema.  Personal health questionnaire:  Is patient Ashkenazi Jewish, have a family history of breast and/or ovarian cancer: no Is there a family history of uterine cancer diagnosed at age < 7, gastrointestinal cancer, urinary tract cancer, family member who is a Personnel officer syndrome-associated carrier: no Is the patient overweight and hypertensive, family history of diabetes, personal history of gestational diabetes, preeclampsia or PCOS: no Is patient over 74, have PCOS,  family history of premature CHD under age 35, diabetes, smoke, have hypertension or peripheral artery disease:  no At any time, has a partner hit, kicked or otherwise hurt or frightened you?: no Over the past 2 weeks, have you felt down, depressed or hopeless?: no Over the past 2 weeks, have you felt little interest or pleasure in doing things?:no   Gynecologic History Patient's last menstrual period was 12/12/2020. Contraception: none Last Pap: 11-02-2019. Results were: normal Last mammogram: n/a. Results were: n/a  Obstetric History OB History  Gravida Para Term Preterm AB Living  4 3 3  0 1 3  SAB IAB Ectopic Multiple Live Births  0 1 0 0 3    # Outcome Date GA Lbr Len/2nd Weight Sex Delivery Anes PTL Lv  4 Term 09/27/13 [redacted]w[redacted]d 05:33 / 01:25 7 lb 1.2 oz (3.209 kg) F Vag-Spont EPI  LIV  3 Term 07/05/12 [redacted]w[redacted]d 98:40 / 00:19 8 lb 2.5 oz (3.7 kg) F Vag-Spont EPI  LIV  2 Term 2009 [redacted]w[redacted]d 16:00 8 lb 5 oz (3.771 kg) M Vag-Spont EPI  LIV  1 IAB             Past Medical History:  Diagnosis Date  . Back pain   . Eczema   . Gall stones     Past Surgical History:  Procedure Laterality Date  . gallstones removed       Current  Outpatient Medications:  .  hydrocortisone 2.5 % cream, Apply topically 2 (two) times daily., Disp: 453.6 g, Rfl: 2 .  medroxyPROGESTERone (DEPO-PROVERA) 150 MG/ML injection, Inject 1 mL (150 mg total) into the muscle every 3 (three) months., Disp: 1 mL, Rfl: 4 .  cyclobenzaprine (FLEXERIL) 5 MG tablet, Take 1 tablet (5 mg total) by mouth 2 (two) times daily as needed for muscle spasms. (Patient not taking: Reported on 12/30/2020), Disp: 60 tablet, Rfl: 6 .  DULoxetine (CYMBALTA) 60 MG capsule, Take 1 capsule (60 mg total) by mouth daily. (Patient not taking: Reported on 12/30/2020), Disp: 30 capsule, Rfl: 12 .  famotidine (PEPCID) 20 MG tablet, Take 1 tablet (20 mg total) by mouth 2 (two) times daily. (Patient not taking: Reported on 12/30/2020), Disp: 30 tablet, Rfl: 0 .  ondansetron (ZOFRAN) 4 MG tablet, Take 1 tablet (4 mg total) by mouth every 8 (eight) hours as needed for nausea or vomiting. (Patient not taking: Reported on 12/30/2020), Disp: 12 tablet, Rfl: 0 .  Prenatal Vit-Fe Fumarate-FA (MULTIVITAMIN-PRENATAL) 27-0.8 MG TABS tablet, Take 1 tablet by mouth daily at 12 noon. (Patient not taking: Reported on 12/30/2020), Disp: 30 tablet, Rfl: 11 .  promethazine (PHENERGAN) 25 MG tablet, Take 1 tablet (25 mg total) by mouth every 6 (six) hours as needed for nausea or vomiting. (Patient not taking: Reported on  12/30/2020), Disp: 30 tablet, Rfl: 0 No Known Allergies  Social History   Tobacco Use  . Smoking status: Current Every Day Smoker    Packs/day: 0.25    Years: 2.00    Pack years: 0.50    Types: Cigarettes  . Smokeless tobacco: Never Used  . Tobacco comment: 8 per day   Substance Use Topics  . Alcohol use: Not Currently    Comment: every 2-3 days    Family History  Problem Relation Age of Onset  . Diabetes Mother   . Healthy Father       Review of Systems  Constitutional: negative for fatigue and weight loss Respiratory: negative for cough and wheezing Cardiovascular: negative  for chest pain, fatigue and palpitations Gastrointestinal: negative for abdominal pain and change in bowel habits Musculoskeletal: positive for myalgias - backache Neurological: negative for gait problems and tremors Behavioral/Psych: negative for abusive relationship, depression Endocrine: negative for temperature intolerance    Genitourinary:negative for abnormal menstrual periods, genital lesions, hot flashes, sexual problems.  Positive for vaginal discharge Integument/breast: negative for breast lump, breast tenderness, nipple discharge and skin lesion(s)    Objective:       BP 112/71   Pulse 82   Ht 5' 4.5" (1.638 m)   Wt 161 lb (73 kg)   LMP 12/12/2020   BMI 27.21 kg/m  General:   alert and no distress  Skin:   no rash or abnormalities  Lungs:   clear to auscultation bilaterally  Heart:   regular rate and rhythm, S1, S2 normal, no murmur, click, rub or gallop  Breasts:   normal without suspicious masses, skin or nipple changes or axillary nodes  Abdomen:  normal findings: no organomegaly, soft, non-tender and no hernia  Pelvis:  External genitalia: normal general appearance Urinary system: urethral meatus normal and bladder without fullness, nontender Vaginal: normal without tenderness, induration or masses Cervix: normal appearance Adnexa: normal bimanual exam Uterus: anteverted and non-tender, normal size   Lab Review Urine pregnancy test Labs reviewed yes Radiologic studies reviewed no  I have spent a total of 20 minutes of face-to-face time, excluding clinical staff time, reviewing notes and preparing to see patient, ordering tests and/or medications, and counseling the patient.   Assessment:     1. Encounter for gynecological examination with Papanicolaou smear of cervix Rx: - Cytology - PAP( [facility])  2. Vaginal discharge Rx: - Cervicovaginal ancillary only( [facility])  3. Encounter for counseling regarding contraception - wants Depo Provera  4.  Encounter for initial prescription of injectable contraceptive Rx: - medroxyPROGESTERone (DEPO-PROVERA) 150 MG/ML injection; Inject 1 mL (150 mg total) into the muscle every 3 (three) months.  Dispense: 1 mL; Refill: 4 - medroxyPROGESTERone (DEPO-PROVERA) injection 150 mg - POCT urine pregnancy  5. Eczema, unspecified type Rx: - hydrocortisone 2.5 % cream; Apply topically 2 (two) times daily.  Dispense: 453.6 g; Refill: 2  6. Backache without radiation - Chronic and unresponsive to physical therapy and chiropractic treatments  Rx: - Ambulatory referral to Orthopedics  7. Tobacco dependence due to cigarettes - cessation with the aid of medication and behavioral modification recommended    Plan:    Education reviewed: calcium supplements, depression evaluation, low fat, low cholesterol diet, safe sex/STD prevention, self breast exams, smoking cessation and weight bearing exercise. Contraception: Depo-Provera injections. Follow up in: 1 year.   Meds ordered this encounter  Medications  . medroxyPROGESTERone (DEPO-PROVERA) 150 MG/ML injection    Sig: Inject 1 mL (150 mg  total) into the muscle every 3 (three) months.    Dispense:  1 mL    Refill:  4  . hydrocortisone 2.5 % cream    Sig: Apply topically 2 (two) times daily.    Dispense:  453.6 g    Refill:  2  . medroxyPROGESTERone (DEPO-PROVERA) injection 150 mg   Orders Placed This Encounter  Procedures  . Ambulatory referral to Orthopedics    Referral Priority:   Routine    Referral Type:   Consultation    Number of Visits Requested:   1  . POCT urine pregnancy     Brock Bad, MD 12/30/2020 2:05 PM

## 2020-12-31 ENCOUNTER — Other Ambulatory Visit: Payer: Self-pay | Admitting: Obstetrics

## 2020-12-31 DIAGNOSIS — B373 Candidiasis of vulva and vagina: Secondary | ICD-10-CM

## 2020-12-31 DIAGNOSIS — B9689 Other specified bacterial agents as the cause of diseases classified elsewhere: Secondary | ICD-10-CM

## 2020-12-31 DIAGNOSIS — B3731 Acute candidiasis of vulva and vagina: Secondary | ICD-10-CM

## 2020-12-31 LAB — CERVICOVAGINAL ANCILLARY ONLY
Bacterial Vaginitis (gardnerella): POSITIVE — AB
Candida Glabrata: NEGATIVE
Candida Vaginitis: POSITIVE — AB
Chlamydia: NEGATIVE
Comment: NEGATIVE
Comment: NEGATIVE
Comment: NEGATIVE
Comment: NEGATIVE
Comment: NEGATIVE
Comment: NORMAL
Neisseria Gonorrhea: NEGATIVE
Trichomonas: NEGATIVE

## 2020-12-31 LAB — CYTOLOGY - PAP: Diagnosis: NEGATIVE

## 2020-12-31 MED ORDER — METRONIDAZOLE 500 MG PO TABS: 500.0000 mg | ORAL_TABLET | Freq: Two times a day (BID) | ORAL | 2 refills | Status: DC

## 2020-12-31 MED ORDER — FLUCONAZOLE 150 MG PO TABS: 150.0000 mg | ORAL_TABLET | Freq: Once | ORAL | 0 refills | Status: AC

## 2021-01-05 ENCOUNTER — Encounter: Payer: Self-pay | Admitting: Neurology

## 2021-01-05 ENCOUNTER — Telehealth: Payer: Self-pay | Admitting: Neurology

## 2021-01-05 ENCOUNTER — Other Ambulatory Visit: Payer: Self-pay

## 2021-01-05 ENCOUNTER — Ambulatory Visit: Payer: Medicaid Other | Admitting: Neurology

## 2021-01-05 VITALS — BP 108/66 | HR 85 | Ht 66.0 in | Wt 161.0 lb

## 2021-01-05 DIAGNOSIS — M549 Dorsalgia, unspecified: Secondary | ICD-10-CM

## 2021-01-05 NOTE — Progress Notes (Signed)
PATIENT: [name] DOB: 10-05-1991  REASON FOR VISIT: follow up HISTORY FROM: patient  HISTORY OF PRESENT ILLNESS: Today 01/05/21  HISTORY  Maudell Stanbrough is a 30 year old female, seen in request by her primary care physician Dr. Clearance Coots, Leonette Most for evaluation of upper back pain, initial evaluation was on April 08, 2020.  I reviewed and summarized the referring note.  She used to work at [employer], currently working as a Engineer, agricultural, which require heavy lifting occasionally, she denies difficulty performing her job,  About 3 years ago, while she was working at [employer], which require constant movement, lifting sometimes, she developed upper back pain, between the shoulder blade, deep muscle achy pain, but she denies radiating pain to her upper extremity, she denies gait abnormality  Over the years, she has tried heating pad, over-the-counter ibuprofen, Tylenol without helping, also failed massage therapy, chiropractic therapy  She has never tried physical therapy, she denies bowel bladder incontinence, now has difficulty moving arms, next due to limitation of the pain, muscle stiffness, especially early morning times,  Per patient, x-ray from chiropractor 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 Pain has been present for at least a year, no identifiable injury, unable to determine etiology, but isn't improving.  Update January 05, 2021 SS: Last time ordered MRI Thoracic Spine, wasn't done due to scheduling conflict of hers. Taking Flexeril, up to 2-3 or a time, wasn't helping. No longer taking  Cymbalta, it didn't help her at all. Still reports mid/upper back pain. Claims hurts her everyday, was working with elderly as care assistant, difficulty lifting patients, bending, driving within 10 minutes back is aching, can be sharp pains. Not taking anything currently. Did PT, without benefit. Isn't working right now. Is not pregnant, is on Depo now. Has 3 kids. Claims can't sleep at night due to pain. Claims c/o back issues for at least 4 years. No radiation of pain down the arms or legs.  REVIEW OF SYSTEMS: Out of a complete 14 system review of symptoms, the patient complains only of the following symptoms, and all other reviewed systems are negative.  Back pain  ALLERGIES: No Known Allergies  HOME MEDICATIONS: Outpatient Medications Prior to Visit  Medication Sig Dispense Refill  . famotidine (PEPCID) 20 MG tablet Take 1 tablet (20 mg total) by mouth 2 (two) times daily. 30 tablet 0  . hydrocortisone 2.5 % cream Apply topically 2 (two) times daily. 453.6 g 2  . medroxyPROGESTERone (DEPO-PROVERA) 150 MG/ML injection Inject 1 mL (150 mg total) into the muscle every 3 (three) months. 1 mL 4  . metroNIDAZOLE (FLAGYL) 500 MG tablet Take 1 tablet (500 mg total) by mouth 2 (two) times daily. 14 tablet 2  . cyclobenzaprine (FLEXERIL) 5 MG tablet Take 1 tablet (5 mg total) by mouth 2 (two) times daily as needed for muscle spasms. 60 tablet 6  . DULoxetine (CYMBALTA) 60 MG capsule Take 1 capsule (60 mg total) by mouth daily. 30 capsule 12  . ondansetron (ZOFRAN) 4 MG tablet Take 1 tablet (4 mg total) by mouth every 8 (eight) hours as needed for nausea or vomiting. (Patient not taking:  Reported on 12/30/2020) 12 tablet 0  . Prenatal Vit-Fe Fumarate-FA (MULTIVITAMIN-PRENATAL) 27-0.8 MG TABS tablet Take 1 tablet by mouth daily at 12 noon. (Patient not taking: Reported on 12/30/2020) 30 tablet 11  . promethazine (PHENERGAN) 25 MG tablet Take 1 tablet (25 mg total) by mouth every 6 (six) hours as needed  for nausea or vomiting. (Patient not taking: No sig reported) 30 tablet 0   No facility-administered medications prior to visit.    PAST MEDICAL HISTORY: Past Medical History:  Diagnosis Date  . Back pain   . Eczema   . Gall stones     PAST SURGICAL HISTORY: Past Surgical History:  Procedure Laterality Date  . gallstones removed      FAMILY HISTORY: Family History  Problem Relation Age of Onset  . Diabetes Mother   . Healthy Father     SOCIAL HISTORY: Social History   Socioeconomic History  . Marital status: Single    Spouse name: Not on file  . Number of children: 3  . Years of education: 67  . Highest education level: High school graduate  Occupational History  . 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 Smoking status: Current Every Day Smoker    Packs/day: 0.25    Years: 2.00    Pack years: 0.50    Types: Cigarettes  . Smokeless tobacco: Never Used  . Tobacco comment: 8 per day   Vaping Use  . Vaping Use: Never used  Substance and Sexual Activity  . Alcohol use: Not Currently    Comment: every 2-3 days  . Drug use: No  . Sexual activity: Yes    Partners: Male  Other Topics Concern  . Not on file  Social History Narrative   Lives at home with children.   Right-handed.   No daily use of caffeine.   Social Determinants of Health   Financial Resource Strain: Not on file  Food Insecurity: Not on file  Transportation Needs: Not on file  Physical Activity: Not on file  Stress: Not on file  Social Connections: Not on file  Intimate Partner Violence: Not on file   PHYSICAL EXAM  Vitals:   01/05/21 1444  BP: 108/66  Pulse: 85  Weight: 161 lb (73 kg)  Height: 5\' 6"  (1.676 m)   Body mass index is 25.99 kg/m.  Generalized: Well developed, in no acute distress   Neurological examination  Mentation: Alert oriented to time, place, history taking. Follows all commands speech and language fluent Cranial nerve II-XII: Pupils  were equal round reactive to light. Extraocular movements were full, visual field were full on confrontational test. Facial sensation and strength were normal. Head turning and shoulder shrug  were normal and symmetric. Motor: The motor testing reveals 5 over 5 strength of all 4 extremities. Good symmetric motor tone is noted throughout. Upper thoracic area pain, with deep palpation, also on the right side. Needs encouraged, lack of effort.  Sensory: Sensory testing is intact to soft touch on all 4 extremities. No evidence of extinction is noted.  Coordination: Cerebellar testing reveals good finger-nose-finger and heel-to-shin bilaterally.  Gait and station: Gait is normal. Tandem gait is normal. Reflexes: Deep tendon reflexes are symmetric and normal bilaterally.   DIAGNOSTIC DATA (LABS, IMAGING, TESTING) - I reviewed patient records, labs, notes, testing and imaging myself where available.  Lab Results  Component Value Date   WBC 13.4 (H) 10/25/2020   HGB 14.2 10/25/2020   HCT 40.9 10/25/2020  MCV 91.1 10/25/2020   PLT 309 10/25/2020      Component Value Date/Time   NA 138 12/14/2018 1639   K 3.6 12/14/2018 1639   CL 106 12/14/2018 1639   CO2 23 12/14/2018 1639   GLUCOSE 80 12/14/2018 1639   BUN <5 (L) 12/14/2018 1639   CREATININE 0.72 12/14/2018 1639   CALCIUM 10.0 12/14/2018 1639   PROT 8.0 12/14/2018 1639   ALBUMIN 4.5 12/14/2018 1639   AST 21 12/14/2018 1639   ALT 14 12/14/2018 1639   ALKPHOS 70 12/14/2018 1639   BILITOT 0.8 12/14/2018 1639   GFRNONAA >60 12/14/2018 1639   GFRAA >60 12/14/2018 1639   No results found for: CHOL, HDL, LDLCALC, LDLDIRECT, TRIG, CHOLHDL No results found for: NLZJ6B No results found for: VITAMINB12 No results found for: TSH  ASSESSMENT AND PLAN 30 y.o. year old female  has a past medical history of Back pain, Eczema, and Gall stones. here with:  1. Upper thoracic back pain -No benefit with physical therapy, or medications -Will order  MRI of thoracic spine again, she claims pain has been present for 4 years -Exam is normal -Will wait to determine follow-up depending on MRI results, possibly consider a pain management referral of other modalities that may be helpful for her comfort -Recommend stretching, ice/heat for comfort  I spent 20 minutes of face-to-face and non-face-to-face time with patient.  This included previsit chart review, lab review, study review, order entry, electronic health record documentation, patient education.  Margie Ege, AGNP-C, DNP 01/05/2021, 3:11 PM [facility] [address], [address] [place], [place] [address] [phone]

## 2021-01-05 NOTE — Telephone Encounter (Signed)
medicaid order sent to GI. No auth they will reach out to the patient to schedule.  

## 2021-01-05 NOTE — Patient Instructions (Signed)
Get MRI of the thoracic spine  Follow-up depending on MRI results  Continue to see your primary doctor

## 2021-01-06 NOTE — Progress Notes (Signed)
I reviewed the nurses note and agree with the plan of care.   Subrena Devereux A, MD 01/06/2018 10:46 AM  

## 2021-01-07 ENCOUNTER — Other Ambulatory Visit: Payer: Self-pay

## 2021-01-07 ENCOUNTER — Ambulatory Visit (INDEPENDENT_AMBULATORY_CARE_PROVIDER_SITE_OTHER): Payer: Medicaid Other | Admitting: Family Medicine

## 2021-01-07 ENCOUNTER — Encounter: Payer: Self-pay | Admitting: Family Medicine

## 2021-01-07 DIAGNOSIS — M546 Pain in thoracic spine: Secondary | ICD-10-CM | POA: Diagnosis not present

## 2021-01-07 NOTE — Progress Notes (Signed)
Office Visit Note   Patient: Courtney [name]           Date of Birth: Feb 25, 1991           MRN: [id number] Visit Date: 01/07/2021 Requested by: Brock Bad, MD [address] [place],  [place] [address] PCP: Brock Bad, MD  Subjective: Chief Complaint  Patient presents with  . Neck - Pain    Pain in the upper back, constant x 4 years. Pain is more on the right side. Has seen a chiropractor - medicaid only covered 6-8 sessions. She then went to PT. She says neither have helped her pain. Most recently is being seen by pain management Margie Ege) and was given a muscle relaxer - no help. Has an MRI scheduled for tomorrow at [facility].    HPI: She is here with right-sided mid back pain.  Symptoms started about 3 or 4 years ago, no injury.  Pain near the right scapula on a daily basis.  Last year she went to a chiropractor for a while but did not get relief.  She had x-rays taken by the chiropractor.  Then she was referred to physical therapy but that did not help either.  They did not do any dry needling.  Pain does not radiate, denies any neck pain.  She has tried a muscle relaxer which has not helped.  She has a history of anemia.  She works as a Lawyer.                ROS:   All other systems were reviewed and are negative.  Objective: Vital Signs: LMP 12/12/2020   Physical Exam:  General:  Alert and oriented, in no acute distress. Pulm:  Breathing unlabored. Psy:  Normal mood, congruent affect. Skin: No rash Back: She has no tenderness over the thoracic spinous processes.  Good neck range of motion and normal neurologic exam.  She has a tender trigger point in the right rhomboid area that seems to reproduce her pain.    Imaging: No results found.  Assessment & Plan: 1.  Chronic right-sided thoracic pain, suspect myofascial pain syndrome. -She will proceed with MRI scan that has already been scheduled for tomorrow.  Presuming it is normal, we  will also check labs today to screen for vitamin D deficiency, look for signs of rheumatologic disease. -We will treat any lab abnormalities.  If MRI is normal, we will start physical therapy again for dry needling.     Procedures: No procedures performed        PMFS History: Patient Active Problem List   Diagnosis Date Noted  . Upper back pain 04/08/2020  . Indication for care in labor or delivery 09/27/2013  . Normal delivery 09/27/2013  . Right orbit fracture (HCC) 08/10/2013  . Eczema 04/17/2013  . Mild hyperemesis gravidarum, antepartum 02/19/2013  . General counseling for initiation of other contraceptive measures 01/02/2013  . Anemia, iron deficiency 05/30/2012   Past Medical History:  Diagnosis Date  . Back pain   . Eczema   . Gall stones     Family History  Problem Relation Age of Onset  . Diabetes Mother   . Healthy Father     Past Surgical History:  Procedure Laterality Date  . gallstones removed     Social History   Occupational History  . 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 Smoking status: Current Every Day Smoker    Packs/day:  0.25    Years: 2.00    Pack years: 0.50    Types: Cigarettes  . Smokeless tobacco: Never Used  . Tobacco comment: 8 per day   Vaping Use  . Vaping Use: Never used  Substance and Sexual Activity  . Alcohol use: Not Currently    Comment: every 2-3 days  . Drug use: No  . Sexual activity: Yes    Partners: Male

## 2021-01-08 ENCOUNTER — Ambulatory Visit
Admission: RE | Admit: 2021-01-08 | Discharge: 2021-01-08 | Disposition: A | Discharge status: Home / Self Care | Payer: Medicaid Other | Source: Ambulatory Visit | Attending: Neurology | Admitting: Neurology

## 2021-01-08 DIAGNOSIS — M549 Dorsalgia, unspecified: Secondary | ICD-10-CM

## 2021-01-08 LAB — CBC WITH DIFFERENTIAL/PLATELET
Absolute Monocytes: 316 cells/uL (ref 200–950)
Basophils Absolute: 49 cells/uL (ref 0–200)
Basophils Relative: 0.6 %
Eosinophils Absolute: 186 cells/uL (ref 15–500)
Eosinophils Relative: 2.3 %
HCT: 39.2 % (ref 35.0–45.0)
Hemoglobin: 13.1 g/dL (ref 11.7–15.5)
Lymphs Abs: 2673 cells/uL (ref 850–3900)
MCH: 32.4 pg (ref 27.0–33.0)
MCHC: 33.4 g/dL (ref 32.0–36.0)
MCV: 97 fL (ref 80.0–100.0)
MPV: 10.9 fL (ref 7.5–12.5)
Monocytes Relative: 3.9 %
Neutro Abs: 4876 cells/uL (ref 1500–7800)
Neutrophils Relative %: 60.2 %
Platelets: 273 10*3/uL (ref 140–400)
RBC: 4.04 10*6/uL (ref 3.80–5.10)
RDW: 12.5 % (ref 11.0–15.0)
Total Lymphocyte: 33 %
WBC: 8.1 10*3/uL (ref 3.8–10.8)

## 2021-01-08 LAB — CYCLIC CITRUL PEPTIDE ANTIBODY, IGG: Cyclic Citrullin Peptide Ab: <16 UNITS

## 2021-01-08 LAB — VITAMIN D 25 HYDROXY (VIT D DEFICIENCY, FRACTURES): Vit D, 25-Hydroxy: 20 ng/mL — ABNORMAL LOW (ref 30–100)

## 2021-01-08 LAB — COMPREHENSIVE METABOLIC PANEL
AG Ratio: 1.6 (calc) (ref 1.0–2.5)
ALT: 12 U/L (ref 6–29)
AST: 16 U/L (ref 10–30)
Albumin: 4.7 g/dL (ref 3.6–5.1)
Alkaline phosphatase (APISO): 73 U/L (ref 31–125)
BUN/Creatinine Ratio: 9 (calc) (ref 6–22)
BUN: 6 mg/dL — ABNORMAL LOW (ref 7–25)
CO2: 24 mmol/L (ref 20–32)
Calcium: 9.8 mg/dL (ref 8.6–10.2)
Chloride: 105 mmol/L (ref 98–110)
Creat: 0.68 mg/dL (ref 0.50–1.10)
Globulin: 3 g/dL (calc) (ref 1.9–3.7)
Glucose, Bld: 112 mg/dL — ABNORMAL HIGH (ref 65–99)
Potassium: 4 mmol/L (ref 3.5–5.3)
Sodium: 138 mmol/L (ref 135–146)
Total Bilirubin: 0.5 mg/dL (ref 0.2–1.2)
Total Protein: 7.7 g/dL (ref 6.1–8.1)

## 2021-01-08 LAB — THYROID PANEL WITH TSH
Free Thyroxine Index: 2.9 (ref 1.4–3.8)
T3 Uptake: 31 % (ref 22–35)
T4, Total: 9.3 ug/dL (ref 5.1–11.9)
TSH: 0.3 mIU/L — ABNORMAL LOW

## 2021-01-08 LAB — ANA: Anti Nuclear Antibody (ANA): NEGATIVE

## 2021-01-08 LAB — FERRITIN: Ferritin: 35 ng/mL (ref 16–154)

## 2021-01-08 LAB — RHEUMATOID FACTOR: Rheumatoid fact SerPl-aCnc: <14 IU/mL (ref <14)

## 2021-01-09 ENCOUNTER — Telehealth: Payer: Self-pay | Admitting: Family Medicine

## 2021-01-09 NOTE — Telephone Encounter (Signed)
Labs show:  Vitamin D is low at 20 (goal is 50-80).  This can be associated with chronic musculoskeletal pain.  I recommend taking vitamin D3, 5,000 IU daily, take 2 daily for 3 months and then 1 daily long-term after that.  Recheck levels in about 6 months.  Ferritin is slightly low, suggesting mild iron deficiency.  I recommend increasing intake of iron-rich foods.  Thyroid TSH is low.  This should be rechecked by your PCP in a few months.  Other labs look good.

## 2021-01-12 ENCOUNTER — Encounter: Payer: Self-pay | Admitting: *Deleted

## 2021-01-15 ENCOUNTER — Encounter (HOSPITAL_BASED_OUTPATIENT_CLINIC_OR_DEPARTMENT_OTHER): Payer: Self-pay | Admitting: *Deleted

## 2021-01-15 ENCOUNTER — Emergency Department (HOSPITAL_BASED_OUTPATIENT_CLINIC_OR_DEPARTMENT_OTHER)
Admission: EM | Admit: 2021-01-15 | Discharge: 2021-01-15 | Disposition: A | Discharge status: Home / Self Care | Payer: Medicaid Other | Attending: Emergency Medicine | Admitting: Emergency Medicine

## 2021-01-15 DIAGNOSIS — L509 Urticaria, unspecified: Secondary | ICD-10-CM

## 2021-01-15 DIAGNOSIS — B349 Viral infection, unspecified: Secondary | ICD-10-CM | POA: Diagnosis not present

## 2021-01-15 DIAGNOSIS — L539 Erythematous condition, unspecified: Secondary | ICD-10-CM | POA: Diagnosis not present

## 2021-01-15 DIAGNOSIS — R21 Rash and other nonspecific skin eruption: Secondary | ICD-10-CM | POA: Diagnosis present

## 2021-01-15 DIAGNOSIS — R000 Tachycardia, unspecified: Secondary | ICD-10-CM | POA: Insufficient documentation

## 2021-01-15 DIAGNOSIS — F17210 Nicotine dependence, cigarettes, uncomplicated: Secondary | ICD-10-CM | POA: Diagnosis not present

## 2021-01-15 MED ORDER — ACETAMINOPHEN 325 MG PO TABS
650.0000 mg | ORAL_TABLET | Freq: Once | ORAL | Status: AC
  Administered 2021-01-15: 650 mg via ORAL
  Filled 2021-01-15: qty 2

## 2021-01-15 MED ORDER — DEXAMETHASONE SODIUM PHOSPHATE 10 MG/ML IJ SOLN
10.0000 mg | Freq: Once | INTRAMUSCULAR | Status: AC
  Administered 2021-01-15: 10 mg via INTRAMUSCULAR
  Filled 2021-01-15: qty 1

## 2021-01-15 MED ORDER — DIPHENHYDRAMINE HCL 50 MG/ML IJ SOLN
25.0000 mg | Freq: Once | INTRAMUSCULAR | Status: AC
  Administered 2021-01-15: 25 mg via INTRAMUSCULAR
  Filled 2021-01-15: qty 1

## 2021-01-15 NOTE — ED Notes (Signed)
States still a little itchy but has had relief from the benadryl.

## 2021-01-15 NOTE — ED Provider Notes (Signed)
[facility] EMERGENCY DEPARTMENT Provider Note   CSN: [id number] Arrival date & time: 01/15/21  1830     History Chief Complaint  Patient presents with  . Rash    Courtney [name] is a 30 y.o. female.  The history is provided by the patient.  Rash Location:  Full body Quality: itchiness and redness   Severity:  Severe Onset quality:  Sudden Duration:  2 days Timing:  Constant Progression:  Unchanged Chronicity:  New Context: not animal contact, not medications, not plant contact and not sick contacts   Relieved by:  Nothing Worsened by:  Nothing Ineffective treatments:  Antihistamines Associated symptoms: fever (did not realize she was febrile)   Associated symptoms: no abdominal pain, no joint pain, no periorbital edema, no shortness of breath, no sore throat, no URI, not vomiting and not wheezing        Past Medical History:  Diagnosis Date  . Back pain   . Eczema   . Gall stones     Patient Active Problem List   Diagnosis Date Noted  . Upper back pain 04/08/2020  . Indication for care in labor or delivery 09/27/2013  . Normal delivery 09/27/2013  . Right orbit fracture (HCC) 08/10/2013  . Eczema 04/17/2013  . Mild hyperemesis gravidarum, antepartum 02/19/2013  . General counseling for initiation of other contraceptive measures 01/02/2013  . Anemia, iron deficiency 05/30/2012    Past Surgical History:  Procedure Laterality Date  . gallstones removed       OB History    Gravida  4   Para  3   Term  3   Preterm  0   AB  1   Living  3     SAB  0   IAB  1   Ectopic  0   Multiple  0   Live Births  3           Family History  Problem Relation Age of Onset  . Diabetes Mother   . Healthy Father     Social History   Tobacco Use  . Smoking status: Current Every Day Smoker    Packs/day: 0.25    Years: 2.00    Pack years: 0.50    Types: Cigarettes  . Smokeless tobacco: Never Used  . Tobacco comment: 8 per day    Vaping Use  . Vaping Use: Never used  Substance Use Topics  . Alcohol use: Not Currently    Comment: every 2-3 days  . Drug use: No    Home Medications Prior to Admission medications   Medication Sig Start Date End Date Taking? Authorizing Provider  hydrocortisone 2.5 % cream Apply topically 2 (two) times daily. 12/30/20   Courtney Bad, MD  medroxyPROGESTERone (DEPO-PROVERA) 150 MG/ML injection Inject 1 mL (150 mg total) into the muscle every 3 (three) months. 12/30/20   Courtney Bad, MD    Allergies    Patient has no known allergies.  Review of Systems   Review of Systems  Constitutional: Positive for fever (did not realize she was febrile). Negative for chills.  HENT: Negative for ear pain and sore throat.   Eyes: Negative for pain and visual disturbance.  Respiratory: Negative for cough, shortness of breath and wheezing.   Cardiovascular: Negative for chest pain and palpitations.  Gastrointestinal: Negative for abdominal pain and vomiting.  Genitourinary: Negative for dysuria and hematuria.  Musculoskeletal: Negative for arthralgias and back pain.  Skin: Positive for rash. Negative for color  change.  Neurological: Negative for seizures and syncope.  All other systems reviewed and are negative.   Physical Exam Updated Vital Signs BP 114/78 (BP Location: Right Arm)   Pulse (!) 120   Temp (!) 102.1 F (38.9 C) (Oral)   Resp (!) 22   Ht 5\' 4"  (1.626 m)   Wt 73 kg   LMP 01/09/2021   SpO2 99%   BMI 27.64 kg/m   Physical Exam Vitals and nursing note reviewed.  Constitutional:      General: She is not in acute distress.    Appearance: She is well-developed.  HENT:     Head: Normocephalic and atraumatic.  Eyes:     Conjunctiva/sclera: Conjunctivae normal.  Cardiovascular:     Rate and Rhythm: Regular rhythm. Tachycardia present.     Heart sounds: No murmur heard.   Pulmonary:     Effort: Pulmonary effort is normal. No respiratory distress.     Breath  sounds: Normal breath sounds.  Musculoskeletal:     Cervical back: Neck supple.  Skin:    General: Skin is warm and dry.     Findings: Rash present.     Comments: Diffuse urticarial rash over most of her body  Neurological:     General: No focal deficit present.     Mental Status: She is alert.  Psychiatric:        Mood and Affect: Mood normal.     ED Results / Procedures / Treatments   Labs (all labs ordered are listed, but only abnormal results are displayed) Labs Reviewed - No data to display  EKG None  Radiology No results found.  Procedures Procedures   Medications Ordered in ED Medications  diphenhydrAMINE (BENADRYL) injection 25 mg (has no administration in time range)  acetaminophen (TYLENOL) tablet 650 mg (650 mg Oral Given 01/15/21 1916)  dexamethasone (DECADRON) injection 10 mg (10 mg Intramuscular Given 01/15/21 1916)    ED Course  I have reviewed the triage vital signs and the nursing notes.  Pertinent labs & imaging results that were available during my care of the patient were reviewed by me and considered in my medical decision making (see chart for details).    MDM Rules/Calculators/A&P                          Courtney [name] presents with a severe, diffuse urticarial rash.  She was noted to be febrile in triage.  The patient was tearful when I recommended blood work and urinalysis.  She has no symptoms aside from her rash.  She had COVID-19 in January of this year.  I told her that she is overall well-appearing.  We can certainly pursue blood work and urinalysis.  Alternatively, if her symptoms are persistent for the next 3 days, she could have a work-up at that point.  It is likely that her urticaria is secondary to some sort of infectious cause.  No indication of meningitis, syphilis, or other emergent cause of her symptoms.  I think her tachycardia and tachypnea is related to her fever. Final Clinical Impression(s) / ED Diagnoses Final diagnoses:   Urticaria  Viral syndrome    Rx / DC Orders ED Discharge Orders    None       February, MD 01/15/21 1943

## 2021-01-15 NOTE — ED Notes (Signed)
Patient continues to C/O itchy

## 2021-01-15 NOTE — Discharge Instructions (Signed)
Continue to take Benadryl for your rash as long as that is present.  If you are still having a fever by Sunday, you need to seek medical attention.

## 2021-01-15 NOTE — ED Triage Notes (Addendum)
C/o rash/ fever x 2 days Benadryl 50 mg x 8 hrs ago

## 2021-01-15 NOTE — ED Notes (Signed)
Yesterday PM after lunch, at approx 1400hrs began having rash and itching. Face, feet, arms red rash is noted, itching and scratching very well documented. Was not aware she had a fever until arriving to ED. Last dose of benadryl 50mg , two tabs at 1000hrs today. Denies any SOB, nausea, vomiting or diarrhea.

## 2021-01-15 NOTE — ED Notes (Signed)
ED Provider at bedside. 

## 2021-01-15 NOTE — ED Notes (Signed)
Tracheal sounds clear, speech WNL

## 2021-03-20 ENCOUNTER — Ambulatory Visit: Payer: Medicaid Other

## 2021-03-24 ENCOUNTER — Ambulatory Visit (INDEPENDENT_AMBULATORY_CARE_PROVIDER_SITE_OTHER): Payer: Medicaid Other | Admitting: *Deleted

## 2021-03-24 ENCOUNTER — Other Ambulatory Visit: Payer: Self-pay

## 2021-03-24 DIAGNOSIS — Z3042 Encounter for surveillance of injectable contraceptive: Secondary | ICD-10-CM

## 2021-03-24 MED ORDER — MEDROXYPROGESTERONE ACETATE 150 MG/ML IM SUSP
150.0000 mg | Freq: Once | INTRAMUSCULAR | Status: AC
  Administered 2021-03-24: 11:00:00 150 mg via INTRAMUSCULAR

## 2021-03-24 NOTE — Progress Notes (Signed)
Date last pap: 12/30/20. Last Depo-Provera: 12/30/20. Depo-Provera 150 mg IM given, pt tolerated well.   Next appointment due 8/30-9/13/22  Administrations This Visit     medroxyPROGESTERone (DEPO-PROVERA) injection 150 mg     Admin Date 03/24/2021 Action Given Dose 150 mg Route Intramuscular Administered By Lanney Gins, CMA

## 2021-03-24 NOTE — Progress Notes (Signed)
Patient was assessed and managed by nursing staff during this encounter. I have reviewed the chart and agree with the documentation and plan. I have also made any necessary editorial changes.  Staley Lunz, MD 03/24/2021 11:43 AM   

## 2021-06-16 ENCOUNTER — Ambulatory Visit: Payer: Medicaid Other

## 2021-12-30 ENCOUNTER — Encounter: Payer: Self-pay | Admitting: Emergency Medicine

## 2021-12-30 ENCOUNTER — Other Ambulatory Visit: Payer: Self-pay

## 2021-12-30 ENCOUNTER — Ambulatory Visit
Admission: EM | Admit: 2021-12-30 | Discharge: 2021-12-30 | Disposition: A | Discharge status: Home / Self Care | Payer: Medicaid Other | Attending: Physician Assistant | Admitting: Physician Assistant

## 2021-12-30 DIAGNOSIS — J019 Acute sinusitis, unspecified: Secondary | ICD-10-CM | POA: Diagnosis not present

## 2021-12-30 DIAGNOSIS — J209 Acute bronchitis, unspecified: Secondary | ICD-10-CM

## 2021-12-30 MED ORDER — AMOXICILLIN-POT CLAVULANATE 875-125 MG PO TABS: 1.0000 | ORAL_TABLET | Freq: Two times a day (BID) | ORAL | 0 refills | Status: DC

## 2021-12-30 MED ORDER — PREDNISONE 20 MG PO TABS
40.0000 mg | ORAL_TABLET | Freq: Every day | ORAL | 0 refills | Status: AC
Start: 2021-12-30 — End: 2022-01-04

## 2021-12-30 NOTE — ED Triage Notes (Signed)
Pt is present today with cough, nasal congestion, sore throat, body aches, and fever. Pt sx started last Thursday  ?

## 2021-12-30 NOTE — ED Provider Notes (Signed)
?[facility] ? ? ? ?CSN: [id number] ?Arrival date & time: 12/30/21  1155 ? ? ?  ? ?History   ?Chief Complaint ?Chief Complaint  ?Patient presents with  ? Cough  ? Sore Throat  ? Nasal Congestion  ? Headache  ? ? ?HPI ?Courtney [name] is a 31 y.o. female.  ? ?Patient here today for evaluation of cough, sore throat, congestion and headache that is been ongoing for the last week.  She reports symptoms seem to be worsening instead of improving.  She notes worst symptoms are her chest congestion and cough.  She has had fever but has not checked her temperature.  She has not had any vomiting or diarrhea.  She has tried over-the-counter medication without significant relief. ? ?The history is provided by the patient.  ? ?Past Medical History:  ?Diagnosis Date  ? Back pain   ? Eczema   ? Gall stones   ? ? ?Patient Active Problem List  ? Diagnosis Date Noted  ? Upper back pain 04/08/2020  ? Indication for care in labor or delivery 09/27/2013  ? Normal delivery 09/27/2013  ? Right orbit fracture (HCC) 08/10/2013  ? Eczema 04/17/2013  ? Mild hyperemesis gravidarum, antepartum 02/19/2013  ? General counseling for initiation of other contraceptive measures 01/02/2013  ? Anemia, iron deficiency 05/30/2012  ? ? ?Past Surgical History:  ?Procedure Laterality Date  ? gallstones removed    ? ? ?OB History   ? ? Gravida  ?4  ? Para  ?3  ? Term  ?3  ? Preterm  ?0  ? AB  ?1  ? Living  ?3  ?  ? ? SAB  ?0  ? IAB  ?1  ? Ectopic  ?0  ? Multiple  ?0  ? Live Births  ?3  ?   ?  ?  ? ? ? ?Home Medications   ? ?Prior to Admission medications   ?Medication Sig Start Date End Date Taking? Authorizing Provider  ?amoxicillin-clavulanate (AUGMENTIN) 875-125 MG tablet Take 1 tablet by mouth every 12 (twelve) hours. 12/30/21  Yes Tomi Bamberger, PA-C  ?predniSONE (DELTASONE) 20 MG tablet Take 2 tablets (40 mg total) by mouth daily with breakfast for 5 days. 12/30/21 01/04/22 Yes Tomi Bamberger, PA-C  ?hydrocortisone 2.5 % cream Apply  topically 2 (two) times daily. 12/30/20   Brock Bad, MD  ?medroxyPROGESTERone (DEPO-PROVERA) 150 MG/ML injection Inject 1 mL (150 mg total) into the muscle every 3 (three) months. 12/30/20   Brock Bad, MD  ? ? ?Family History ?Family History  ?Problem Relation Age of Onset  ? Diabetes Mother   ? Healthy Father   ? ? ?Social History ?Social History  ? ?Tobacco Use  ? Smoking status: Every Day  ?  Packs/day: 0.25  ?  Years: 2.00  ?  Pack years: 0.50  ?  Types: Cigarettes  ? Smokeless tobacco: Never  ? Tobacco comments:  ?  8 per day   ?Vaping Use  ? Vaping Use: Never used  ?Substance Use Topics  ? Alcohol use: Not Currently  ?  Comment: every 2-3 days  ? Drug use: No  ? ? ? ?Allergies   ?Patient has no known allergies. ? ? ?Review of Systems ?Review of Systems  ?Constitutional:  Positive for fever (subjective).  ?HENT:  Positive for congestion and sore throat. Negative for ear pain.   ?Eyes:  Negative for discharge and redness.  ?Respiratory:  Positive for cough. Negative for shortness  of breath and wheezing.   ?Gastrointestinal:  Negative for abdominal pain, diarrhea, nausea and vomiting.  ?Musculoskeletal:  Positive for myalgias.  ? ? ?Physical Exam ?Triage Vital Signs ?ED Triage Vitals  ?Enc Vitals Group  ?   BP   ?   Pulse   ?   Resp   ?   Temp   ?   Temp src   ?   SpO2   ?   Weight   ?   Height   ?   Head Circumference   ?   Peak Flow   ?   Pain Score   ?   Pain Loc   ?   Pain Edu?   ?   Excl. in GC?   ? ?No data found. ? ?Updated Vital Signs ?BP 109/74   Pulse 87   Temp 98.7 ?F (37.1 ?C) (Oral)   Resp 18   SpO2 98%  ?   ? ?Physical Exam ?Vitals and nursing note reviewed.  ?Constitutional:   ?   General: She is not in acute distress. ?   Appearance: Normal appearance. She is not ill-appearing.  ?HENT:  ?   Head: Normocephalic and atraumatic.  ?   Nose: Congestion present.  ?Eyes:  ?   Conjunctiva/sclera: Conjunctivae normal.  ?Cardiovascular:  ?   Rate and Rhythm: Normal rate and regular rhythm.   ?   Heart sounds: Normal heart sounds. No murmur heard. ?Pulmonary:  ?   Effort: Pulmonary effort is normal. No respiratory distress.  ?   Breath sounds: Rhonchi (scattered) present. No wheezing or rales.  ?Skin: ?   General: Skin is warm and dry.  ?Neurological:  ?   Mental Status: She is alert.  ?Psychiatric:     ?   Mood and Affect: Mood normal.     ?   Thought Content: Thought content normal.  ? ? ? ?UC Treatments / Results  ?Labs ?(all labs ordered are listed, but only abnormal results are displayed) ?Labs Reviewed - No data to display ? ?EKG ? ? ?Radiology ?No results found. ? ?Procedures ?Procedures (including critical care time) ? ?Medications Ordered in UC ?Medications - No data to display ? ?Initial Impression / Assessment and Plan / UC Course  ?I have reviewed the triage vital signs and the nursing notes. ? ?Pertinent labs & imaging results that were available during my care of the patient were reviewed by me and considered in my medical decision making (see chart for details). ? ?  ?Steroid and antibiotic prescribed to cover bronchitis as well as sinusitis given duration of symptoms.  Recommended follow-up if symptoms fail to improve or worsen. ? ?Final Clinical Impressions(s) / UC Diagnoses  ? ?Final diagnoses:  ?Acute bronchitis, unspecified organism  ?Acute sinusitis, recurrence not specified, unspecified location  ? ?Discharge Instructions   ?None ?  ? ?ED Prescriptions   ? ? Medication Sig Dispense Auth. Provider  ? predniSONE (DELTASONE) 20 MG tablet Take 2 tablets (40 mg total) by mouth daily with breakfast for 5 days. 10 tablet Tomi Bamberger, PA-C  ? amoxicillin-clavulanate (AUGMENTIN) 875-125 MG tablet Take 1 tablet by mouth every 12 (twelve) hours. 14 tablet Tomi Bamberger, PA-C  ? ?  ? ?PDMP not reviewed this encounter. ?  ?Tomi Bamberger, PA-C ?12/30/21 1351 ? ?

## 2022-01-07 IMAGING — US US OB COMP LESS 14 WK
1 series · 15 of 28 positions shown · non-contrast
Comparison: None.

CLINICAL DATA: Abdominal pain.

EXAM:
OBSTETRIC <14 WK ULTRASOUND
TECHNIQUE: Transvaginal ultrasound was performed for complete evaluation of the
gestation as well as the maternal uterus, adnexal regions, and
pelvic cul-de-sac.

[Series 1: us ob comp less 14 wk · 15 of 48 slices shown]
[im 1/48]
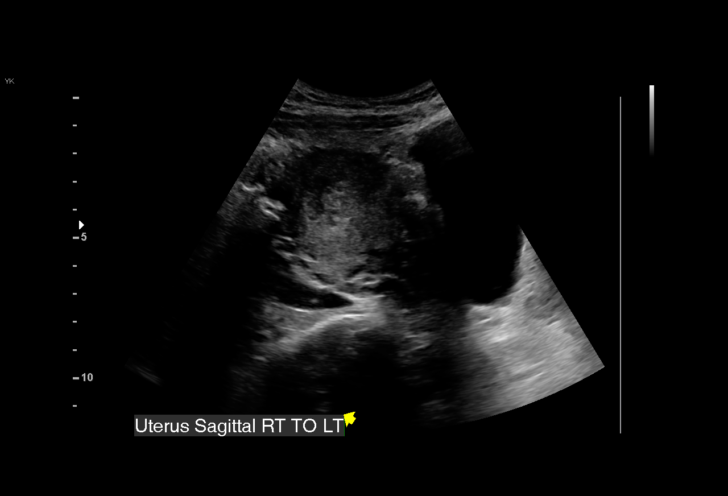
[im 4/48]
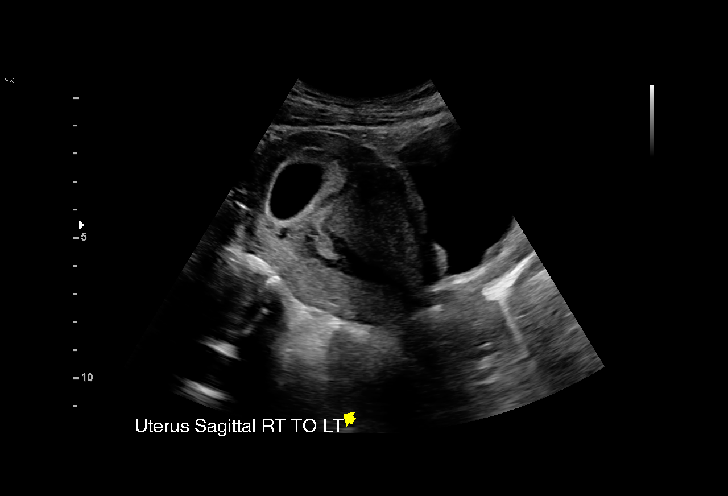
[im 7/48]
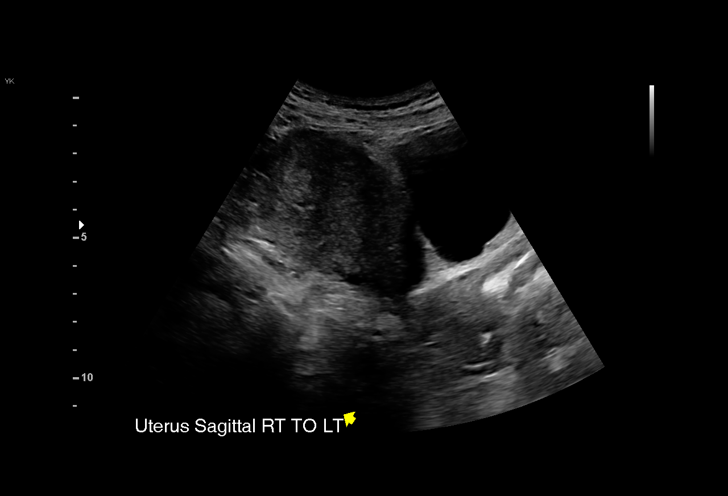
[im 11/48]
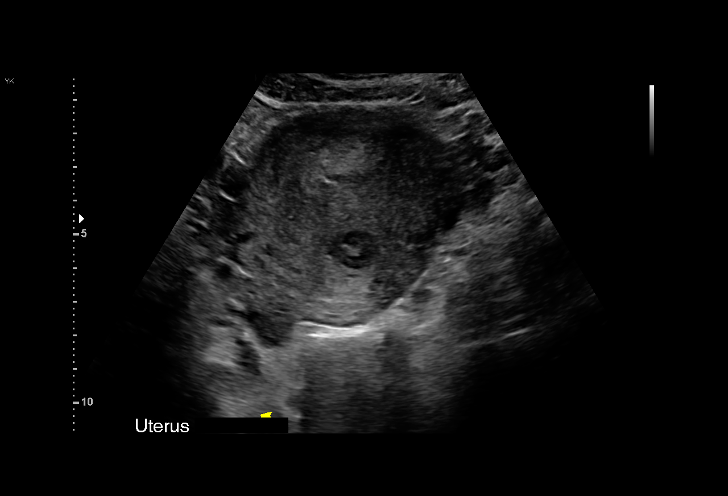
[im 14/48]
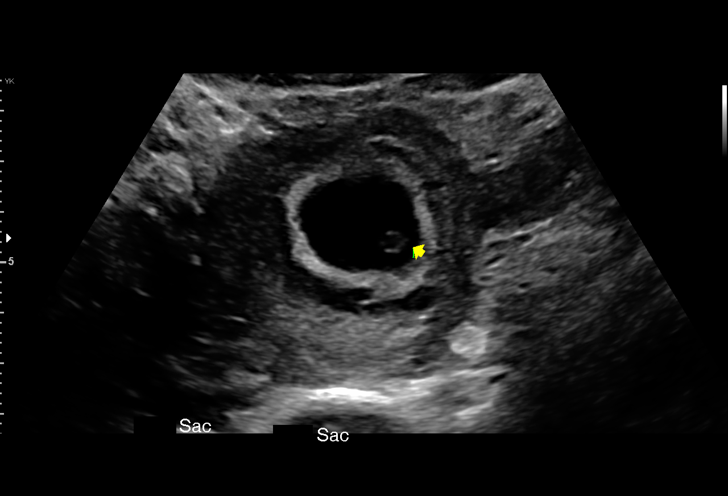
[im 18/48]
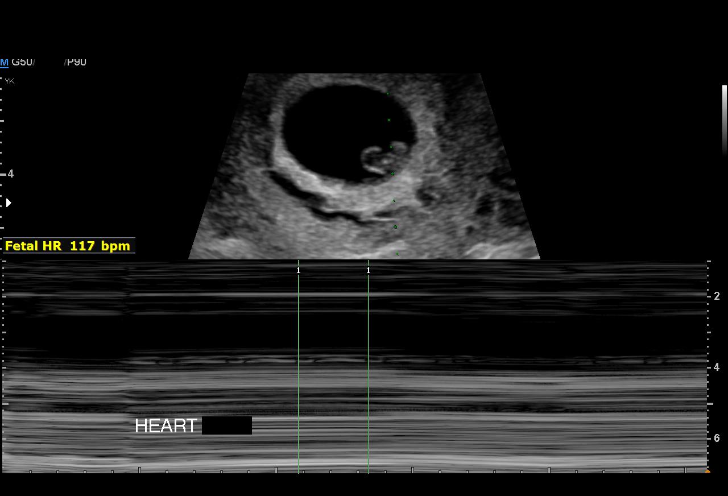
[im 21/48]
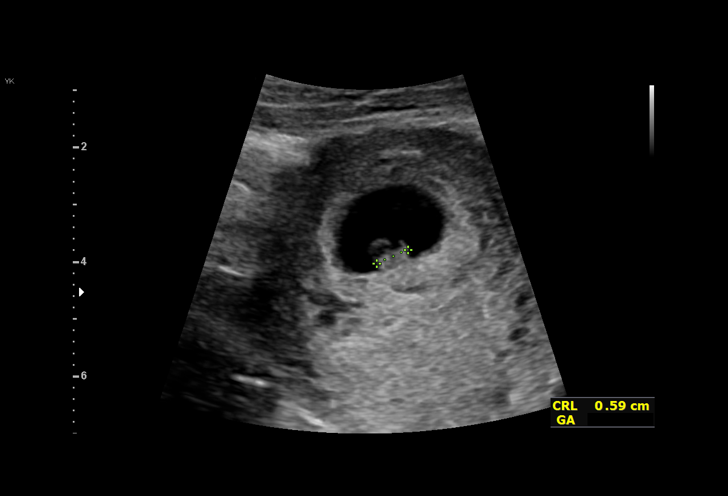
[im 25/48]
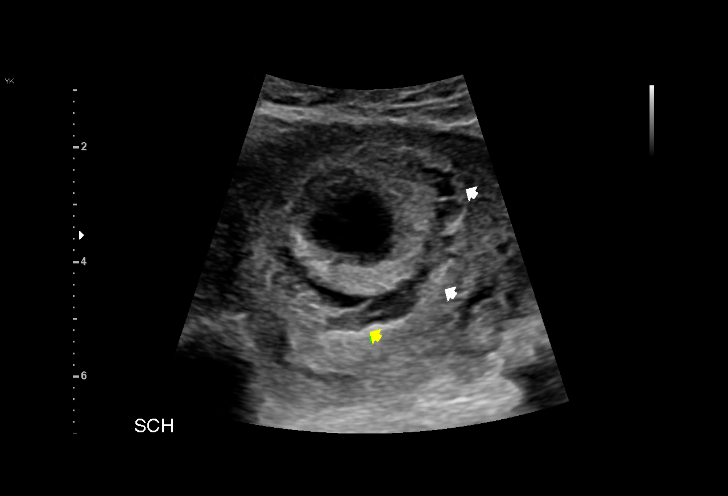
[im 27/48]
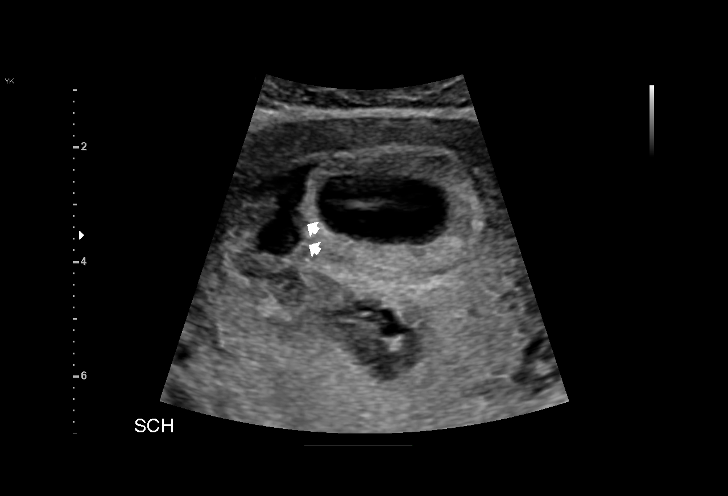
[im 30/48]
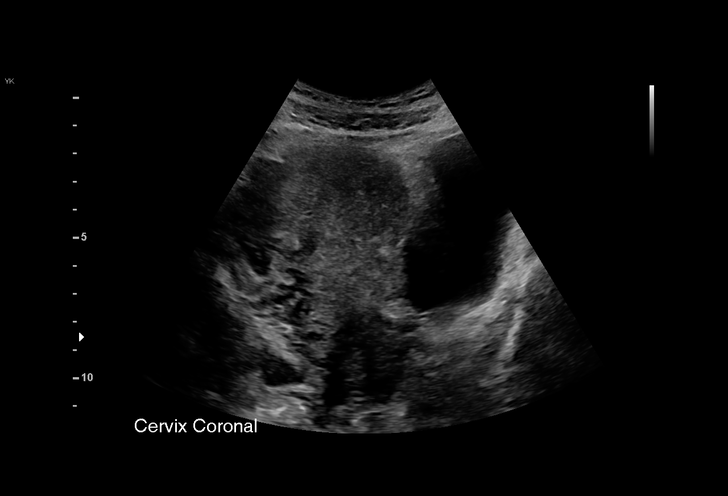
[im 34/48]
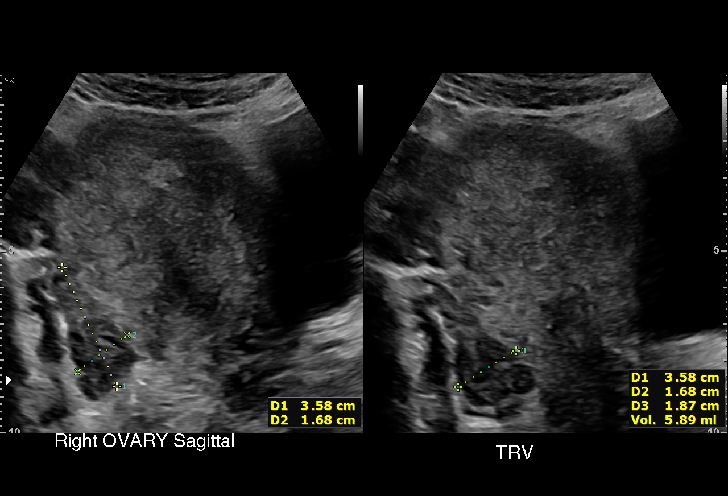
[im 37/48]
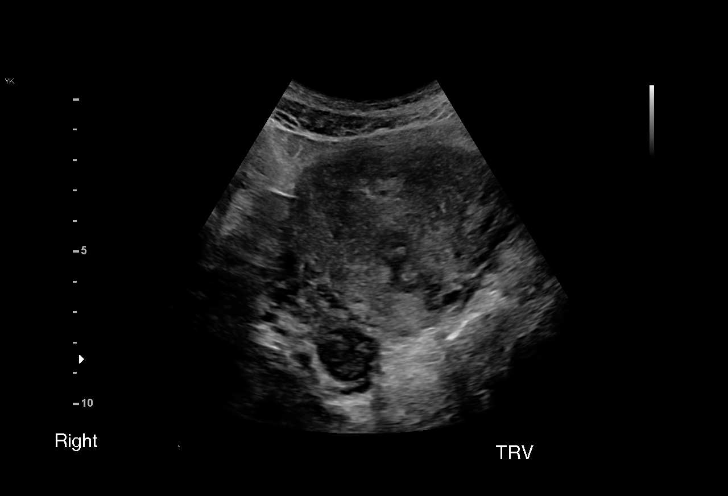
[im 41/48]
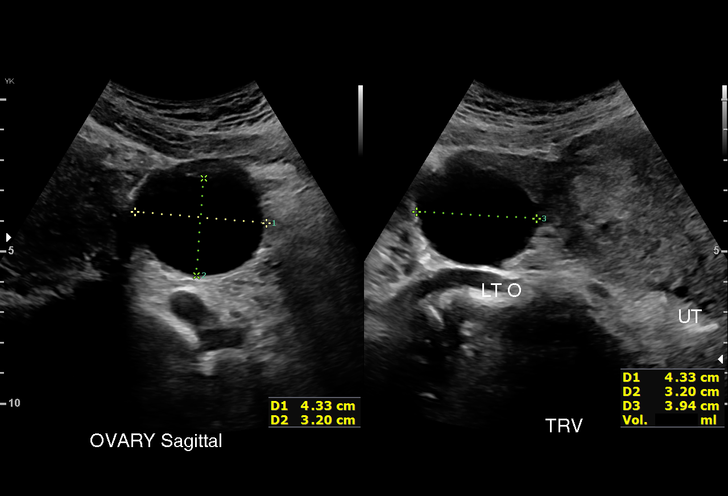
[im 44/48]
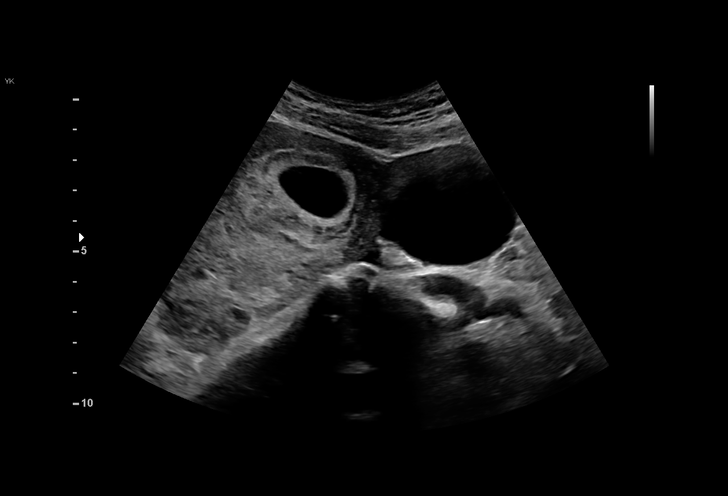
[im 48/48]
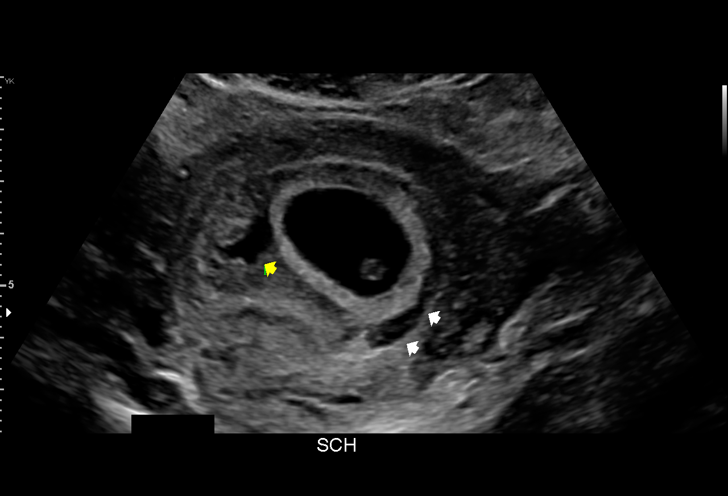

[15 of 28 positions shown; findings below may reference images not displayed]

FINDINGS: Intrauterine gestational sac: Single

Yolk sac:  Visualized.

Embryo:  Visualized.

Cardiac Activity: Visualized.

Heart Rate: 116 bpm

CRL: 6.1  mm   6 w 3 d                  US EDC: 06/17/2021

Subchorionic hemorrhage:  Small/moderate.

Maternal uterus/adnexae: Normal appearance of the right ovary. There
is a 4.3 cm cyst in the left ovary, possibly a corpus luteum. No
free fluid in the pelvis.
IMPRESSION: 1. Single viable intrauterine pregnancy with estimated gestational
age of 6 weeks and 3 days.

2.  Small/moderate subchorionic hematoma.

3. Simple cyst in the left ovary measuring 4.3 cm, possibly a corpus
luteum.

## 2022-01-28 ENCOUNTER — Telehealth: Payer: Self-pay | Admitting: Emergency Medicine

## 2022-01-28 ENCOUNTER — Telehealth: Payer: Self-pay | Admitting: Neurology

## 2022-01-28 NOTE — Telephone Encounter (Signed)
Patient was last seen 01/05/21: ? ?Upper thoracic back pain ?-No benefit with physical therapy, or medications ?-Will order MRI of thoracic spine again, she claims pain has been present for 4 years ?-Exam is normal ?-Will wait to determine follow-up depending on MRI results, possibly consider a pain management referral of other modalities that may be helpful for her comfort ?-Recommend stretching, ice/heat for comfort ?____________________________________ ?Thoracic spine is normal. She was released back to the care of her primary care physician. She will need to contact that office for this type of letter. I called the patient to let her know. ?

## 2022-01-28 NOTE — Telephone Encounter (Signed)
Pt called requesting work restrictions note for back pain. Pt instructed to call neuro provider for note, per Dr. Jodi Mourning. Pt given phone number for her neuro provider, agreed to call. ?

## 2022-01-28 NOTE — Telephone Encounter (Signed)
Pt states she needs documentation of driving restrictions for pt place of work. ?Pt states her workplace is making her drive 10 hours and she would like documentation to prove she is not able to do so due to her back pain.  ?Would like a call back.  ?[phone] ?

## 2022-02-11 ENCOUNTER — Encounter: Payer: Self-pay | Admitting: Orthopaedic Surgery

## 2022-02-11 ENCOUNTER — Ambulatory Visit (INDEPENDENT_AMBULATORY_CARE_PROVIDER_SITE_OTHER): Payer: Medicaid Other | Admitting: Orthopaedic Surgery

## 2022-02-11 ENCOUNTER — Ambulatory Visit (INDEPENDENT_AMBULATORY_CARE_PROVIDER_SITE_OTHER): Payer: Medicaid Other

## 2022-02-11 DIAGNOSIS — M542 Cervicalgia: Secondary | ICD-10-CM

## 2022-02-11 NOTE — Progress Notes (Signed)
? ?Office Visit Note ?  ?Patient: Courtney [name]           ?Date of Birth: 1990-12-28           ?MRN: [id number] ?Visit Date: 02/11/2022 ?             ?Requested by: Brock Bad, MD ?[address] ?[address] ?[place],  [place] [address] ?PCP: Brock Bad, MD ? ? ?Assessment & Plan: ?Visit Diagnoses: Neck pain ? ?Plan: 31 year old woman with 4+ year history of back pain.  She denies any specific event but does recall falling out of a pickup truck once.  She has had x-rays by her chiropractor of her thoracic spine which are normal.  She has had chiropractic care which only believes the pain for a short period of time.  She has had physical therapy on her neck and her upper back without improvement.  She has been on muscle relaxants and is maxed those out without still relief.  Her pain is debilitating.  She has also been seen by pain management.  She does not want to take narcotics she has referred pain to between her shoulder blades with movement of her neck.  Her neck range of motion is very stiff.  This is now been going on for over a year.  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  Would recommend an MRI of her cervical spine.  We have also provided her with a soft cervical collar to wear as needed.  She will follow-up once the MRI is complete ? ?Follow-Up Instructions: No follow-ups on file.  ? ?Orders:  ?No orders of the defined types were placed in this encounter. ? ?No orders of the defined types were placed in this encounter. ? ? ? ? Procedures: ?No procedures performed ? ? ?Clinical Data: ?No additional findings. ? ? ?Subjective: ?Chief Complaint  ?Patient presents with  ? Middle Back - Pain  ?Patient presents today for thoracic back pain that has been present for years. She said that it is worse on the right side. She has seen Dr.Hilts in the past. She has been to pain management with Dr.Slacks. She said that she was  given muscle relaxers, which have stopped working. 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  ? ? ? ?Review of Systems  ?All other systems reviewed and are negative. ? ? ?Objective: ?Vital Signs: There were no vitals taken for this visit. ? ?Physical Exam ?Constitutional:   ?   Appearance: Normal appearance.  ?Pulmonary:  ?   Effort: Pulmonary effort is normal.  ?Skin: ?   General: Skin is warm and dry.  ?Neurological:  ?   Mental Status: She is alert.  ?Psychiatric:     ?   Mood and Affect: Mood normal.     ?   Behavior: Behavior normal.  ? ? ?Ortho Exam ?Examination of her neck she cannot touch her chin to her chest and when she tries it reproduces the pain between her shoulder blades.  Extension of her neck is also very limited.  Very painful turning her neck side to side again reproduces the pain between her shoulder blades.  Strength is intact no radicular symptoms into her arms though she says that when she is driving for a while her arms do go numb ?Specialty Comments:  ?No specialty comments available. ? ?Imaging: ?No  results found. ? ? ?PMFS History: ?Patient Active Problem List  ? Diagnosis Date Noted  ? Upper back pain 04/08/2020  ? Indication for care in labor or delivery 09/27/2013  ? Normal delivery 09/27/2013  ? Right orbit fracture (HCC) 08/10/2013  ? Eczema 04/17/2013  ? Mild hyperemesis gravidarum, antepartum 02/19/2013  ? General counseling for initiation of other contraceptive measures 01/02/2013  ? Anemia, iron deficiency 05/30/2012  ? ?Past Medical History:  ?Diagnosis Date  ? Back pain   ? Eczema   ? Gall stones   ?  ?Family History  ?Problem Relation Age of Onset  ? Diabetes Mother   ? Healthy Father   ?  ?Past Surgical History:  ?Procedure Laterality Date  ? gallstones removed    ? ?Social History  ? ?Occupational History  ? 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 Smoking status: Every Day  ?  Packs/day: 0.25  ?  Years: 2.00  ?  Pack  years: 0.50  ?  Types: Cigarettes  ? Smokeless tobacco: Never  ? Tobacco comments:  ?  8 per day   ?Vaping Use  ? Vaping Use: Never used  ?Substance and Sexual Activity  ? Alcohol use: Not Currently  ?  Comment: every 2-3 days  ? Drug use: No  ? Sexual activity: Yes  ?  Partners: Male  ?  Birth control/protection: Injection  ? ? ? ? ? ? ?

## 2022-03-16 ENCOUNTER — Telehealth: Payer: Self-pay | Admitting: Orthopaedic Surgery

## 2022-03-16 NOTE — Telephone Encounter (Signed)
Courtney [name] is in the process of getting her MRI scheduled. She needs a letter for work stating her work restrictions. Her job is asking for this information. Please advise

## 2022-03-17 ENCOUNTER — Telehealth: Payer: Self-pay | Admitting: Physician Assistant

## 2022-03-17 ENCOUNTER — Other Ambulatory Visit: Payer: Self-pay

## 2022-03-17 DIAGNOSIS — M542 Cervicalgia: Secondary | ICD-10-CM

## 2022-03-17 NOTE — Telephone Encounter (Signed)
Patient called asked if the note can read that she can only drive for 2 hours during the course of a day.. 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 Patient said her employer keeps sending her home because she does not have this documentation. The number to contact is [phone]

## 2022-03-18 ENCOUNTER — Other Ambulatory Visit: Payer: Self-pay

## 2022-03-18 NOTE — Telephone Encounter (Signed)
Has not been made? She wants to come get it today  Cb [phone]

## 2022-03-23 ENCOUNTER — Ambulatory Visit
Admission: RE | Admit: 2022-03-23 | Discharge: 2022-03-23 | Disposition: A | Discharge status: Home / Self Care | Payer: Medicaid Other | Source: Ambulatory Visit | Attending: Orthopaedic Surgery | Admitting: Orthopaedic Surgery

## 2022-03-23 DIAGNOSIS — M542 Cervicalgia: Secondary | ICD-10-CM

## 2022-03-24 ENCOUNTER — Telehealth: Payer: Self-pay | Admitting: Orthopaedic Surgery

## 2022-03-24 NOTE — Telephone Encounter (Signed)
Received $25.00 cash and medical records release form from patient /forwarding to [facility] today

## 2022-03-31 ENCOUNTER — Encounter: Payer: Self-pay | Admitting: Orthopaedic Surgery

## 2022-03-31 ENCOUNTER — Ambulatory Visit (INDEPENDENT_AMBULATORY_CARE_PROVIDER_SITE_OTHER): Payer: Medicaid Other | Admitting: Orthopaedic Surgery

## 2022-03-31 DIAGNOSIS — M549 Dorsalgia, unspecified: Secondary | ICD-10-CM

## 2022-03-31 DIAGNOSIS — M542 Cervicalgia: Secondary | ICD-10-CM | POA: Diagnosis not present

## 2022-03-31 NOTE — Progress Notes (Signed)
Office Visit Note   Patient: Courtney [name]           Date of Birth: 1991/08/01           MRN: [id number] Visit Date: 03/31/2022              Requested by: Brock Bad, MD [address] [place],  [place] [address] PCP: Brock Bad, MD   Assessment & Plan: Visit Diagnoses:  1. Upper back pain   2. Cervicalgia     Plan: Courtney [name] relates at least a 4-year history of upper back problems including her cervical and thoracic spine.  She has had a lot of spasm and discomfort.  She has been seen by the neurologist who prescribed muscle relaxants.  She has been to the chiropractor and has had some physical therapy and notes that she is really not much better.  She has not experienced any problems referable to either upper extremity.  She has had a prior MRI scan of the thoracic spine that was normal and I have just ordered MRI of the cervical spine that was normal as well.  She does try over-the-counter medicines and she has had a cervical collar.  She presently is not working.  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  Had a long discussion regarding her normal MRI scans.  She does not have any problem with her lumbar spine.  She would like to try further physical therapy possibly with some dry needling.  We will refer her to our physical therapist and like to check her back in 4 to 6 weeks if she still having a problem.  I certainly would not support her not working for the moment  Follow-Up Instructions: Return if symptoms worsen or fail to improve.   Orders:  No orders of the defined types were placed in this encounter.  No orders of the defined types were placed in this encounter.     Procedures: No procedures performed   Clinical Data: No additional findings.   Subjective: Chief Complaint  Patient presents with   Neck - Follow-up    MRI  review  Patient presents today for follow up on her neck. She had an MRI and is here today for those results.  No change in her symptoms.  Still having trouble with her neck and her thoracic spine but no referred pain to either upper or lower extremity  HPI  Review of Systems   Objective: Vital Signs: There were no vitals taken for this visit.  Physical Exam Constitutional:      Appearance: She is well-developed.  Eyes:     Pupils: Pupils are equal, round, and reactive to light.  Pulmonary:     Effort: Pulmonary effort is normal.  Skin:    General: Skin is warm and dry.  Neurological:     Mental Status: She is alert and oriented to person, place, and time.  Psychiatric:        Behavior: Behavior normal.     Ortho Exam awake alert and oriented x3.  Comfortable sitting.  Not wearing the cervical collar today.  Has definitely better range of motion.  She still has some limitation of neck extension but no referred pain to either shoulder or upper extremity is able to touch her chin to her chest.  Has rotation at least 60 to 70 degrees to  the right and to the left.  Good strength of both upper extremities.  She does have some percussible tenderness in the mid thoracic spine and along the vertebral border of the right scapula.  She did have a lot of areas of trigger point tenderness.  Might have fibromyalgia  Specialty Comments:  No specialty comments available.  Imaging: No results found.   PMFS History: Patient Active Problem List   Diagnosis Date Noted   Cervicalgia 02/11/2022   Upper back pain 04/08/2020   Indication for care in labor or delivery 09/27/2013   Normal delivery 09/27/2013   Right orbit fracture (HCC) 08/10/2013   Eczema 04/17/2013   Mild hyperemesis gravidarum, antepartum 02/19/2013   General counseling for initiation of other contraceptive measures 01/02/2013   Anemia, iron deficiency 05/30/2012   Past Medical History:  Diagnosis Date   Back pain     Eczema    Gall stones     Family History  Problem Relation Age of Onset   Diabetes Mother    Healthy Father     Past Surgical History:  Procedure Laterality Date   gallstones removed     Social History   Occupational History   Occupation: primary care office as Geophysicist/field seismologist  Tobacco Use   Smoking status: Every Day    Packs/day: 0.25    Years: 2.00    Total pack years: 0.50    Types: Cigarettes   Smokeless tobacco: Never   Tobacco comments:    8 per day   Vaping Use   Vaping Use: Never used  Substance and Sexual Activity   Alcohol use: Not Currently    Comment: every 2-3 days   Drug use: No   Sexual activity: Yes    Partners: Male    Birth control/protection: Injection

## 2022-04-16 ENCOUNTER — Telehealth: Payer: Self-pay | Admitting: Orthopaedic Surgery

## 2022-04-16 NOTE — Telephone Encounter (Signed)
[employer] forms received. To [employer]. 

## 2022-04-23 NOTE — Therapy (Signed)
OUTPATIENT PHYSICAL THERAPY CERVICAL EVALUATION   Patient Name: Courtney [name] MRN: [id number] DOB:[birth year], 31 y.o., female Today's Date: 04/27/2022   PT End of Session - 04/27/22 1242     Visit Number 1    Number of Visits 7    Date for PT Re-Evaluation 06/12/22    Authorization Type MCD    PT Start Time 1243   patient late   PT Stop Time 1312    PT Time Calculation (min) 29 min    Activity Tolerance Patient tolerated treatment well    Behavior During Therapy [facility] for tasks assessed/performed             Past Medical History:  Diagnosis Date   Back pain    Eczema    Gall stones    Past Surgical History:  Procedure Laterality Date   gallstones removed     Patient Active Problem List   Diagnosis Date Noted   Cervicalgia 02/11/2022   Upper back pain 04/08/2020   Indication for care in labor or delivery 09/27/2013   Normal delivery 09/27/2013   Right orbit fracture (HCC) 08/10/2013   Eczema 04/17/2013   Mild hyperemesis gravidarum, antepartum 02/19/2013   General counseling for initiation of other contraceptive measures 01/02/2013   Anemia, iron deficiency 05/30/2012    PCP: Brock Bad, MD  REFERRING PROVIDER: Valeria Batman, MD  REFERRING DIAG:  M54.9 (ICD-10-CM) - Upper back pain  M54.2 (ICD-10-CM) - Cervicalgia    THERAPY DIAG:  Pain in thoracic spine  Muscle weakness (generalized)  Abnormal posture  Cervicalgia  Rationale for Evaluation and Treatment Rehabilitation  ONSET DATE: chronic   SUBJECTIVE:                                                                                                                                                                                                         SUBJECTIVE STATEMENT: Patient reports history of pain along the Rt upper/mid thoracic area that has been going on for 7 years. She reports she fell off the back of a pickup truck and landed on her arm and believes that this could be the  cause of her pain. She reports the pain has been worsening since onset. She has tried PT and chiropractic care without relief. She describes the pain as stabbing and tingling along the Rt upper/mid thoracic and scapular region.   PERTINENT HISTORY:  None   PAIN:  Are you having pain? Yes: NPRS scale: 5/10 Pain location: Rt thoracic/scapular region Pain description: stabbing Aggravating factors: evening, driving, quick movements Relieving factors:  medication   PRECAUTIONS: None  WEIGHT BEARING RESTRICTIONS No  FALLS:  Has patient fallen in last 6 months? No  LIVING ENVIRONMENT: Lives with: lives with their family Lives in: House/apartment Stairs: No Has following equipment at home: None  OCCUPATION: unemployed   PLOF: Independent  PATIENT GOALS I want to feel better.   OBJECTIVE:   DIAGNOSTIC FINDINGS:  Cervical MRI: IMPRESSION: No evidence of acute abnormality or significant stenosis.  PATIENT SURVEYS:  NDI 50% disability   COGNITION: Overall cognitive status: Within functional limits for tasks assessed   SENSATION: Not tested  POSTURE: rounded shoulders and forward head  PALPATION: TTP Rt upper trap, levator scapulae, rhomboids, middle trap with trigger points noted throughout  Hypomobility and pain with PAIVM upper/mid T-spine     CERVICAL ROM:   Active ROM A/PROM (deg) eval  Flexion 50  Extension 20 pn!  Right lateral flexion WNL  Left lateral flexion WNL  Right rotation WNL  Left rotation WNL   (Blank rows = not tested)  THORACIC ROM:  Active ROM A/PROM (deg) eval  Flexion 25% limited pn  Extension WNL pn  Right lateral flexion WNL pn  Left lateral flexion WNL  Right rotation WNL  Left rotation WNL   UPPER EXTREMITY ROM:  Active ROM Right eval Left eval  Shoulder flexion WNL pn WNL  Shoulder extension    Shoulder abduction 140 pn WNL  Shoulder adduction    Shoulder extension    Shoulder internal rotation WNL WNL  Shoulder  external rotation WNL WNL  Elbow flexion    Elbow extension    Wrist flexion    Wrist extension    Wrist ulnar deviation    Wrist radial deviation    Wrist pronation    Wrist supination     (Blank rows = not tested)  UPPER EXTREMITY MMT:  MMT Right eval Left eval  Shoulder flexion 4+ 5  Shoulder extension    Shoulder abduction 4 upper trap compensation 5  Shoulder adduction    Shoulder extension    Shoulder internal rotation    Shoulder external rotation    Middle trapezius 3+ 4-  Lower trapezius    Elbow flexion    Elbow extension    Wrist flexion    Wrist extension    Wrist ulnar deviation    Wrist radial deviation    Wrist pronation    Wrist supination    Grip strength     (Blank rows = not tested)  CERVICAL SPECIAL TESTS:  Spurling's test: Negative and Distraction test: Negative   FUNCTIONAL TESTS:  N/A   TODAY'S TREATMENT:  [facility] Adult PT Treatment:                                                DATE: 04/26/22 Therapeutic Exercise: Demonstrated and issue initial HEP.   Therapeutic Activity: Education on assessment findings that will be addressed throughout duration of POC.      PATIENT EDUCATION:  Education details: see treatment  Person educated: Patient Education method: Explanation, Demonstration, Tactile cues, Verbal cues, and Handouts Education comprehension: verbalized understanding, returned demonstration, verbal cues required, tactile cues required, and needs further education   HOME EXERCISE PROGRAM: Access Code: [email or web address] URL: [email or web address] Date: 04/27/2022 Prepared by: Letitia Libra  Exercises - Seated Thoracic Lumbar Extension  - 2 x daily - 7 x weekly -  1 sets - 10 reps - Sidelying Thoracic Rotation with Open Book  - 2 x daily - 7 x weekly - 1 sets - 10 reps - Cat Cow  - 2 x daily - 7 x weekly - 1 sets - 10 reps  ASSESSMENT:  CLINICAL IMPRESSION: Patient is a 31 y.o. female who was seen today for physical  therapy evaluation and treatment for chronic Rt sided thoracic pain localized between the medial scapula border and T-spine. She has received physical therapy and chiropractic care previously without relief. Upon assessment she is noted to have myofascial trigger points about the periscapular musculature, limited thoracic mobility, postural abnormalities, and postural strength deficits. She will benefit from skilled PT to address the above stated deficits in order to optimize her function.    OBJECTIVE IMPAIRMENTS decreased activity tolerance, decreased ROM, decreased strength, hypomobility, increased fascial restrictions, impaired UE functional use, postural dysfunction, and pain.   ACTIVITY LIMITATIONS carrying, lifting, sleeping, and reach over head  PARTICIPATION LIMITATIONS: meal prep, cleaning, laundry, driving, shopping, community activity, occupation, and yard work  PERSONAL FACTORS Past/current experiences and Time since onset of injury/illness/exacerbation are also affecting patient's functional outcome.   REHAB POTENTIAL: Fair previously completed PT and chiropractic treatment for same condition without relief  CLINICAL DECISION MAKING: Stable/uncomplicated  EVALUATION COMPLEXITY: Low   GOALS: Goals reviewed with patient? No  SHORT TERM GOALS: Target date: 05/18/2022   Patient will be independent and compliant with initial HEP.   Baseline: issued at eval  Goal status: INITIAL  2.  Patient will demonstrate knowledge and application of appropriate sitting posture to reduce stress on her neck/back.  Baseline: see above Goal status: INITIAL  3.  Patient will demonstrate at least 30 degrees of cervical extension AROM without an increase in pain to improve ability to complete self-care activities.  Baseline: see above  Goal status: INITIAL   LONG TERM GOALS: Target date: 06/08/2022  Patient will demonstrate at least 4/5 bilateral middle trap strength to improve postural  stability.  Baseline: see above  Goal status: INITIAL  2.  Patient will demonstrate 5/5 Rt shoulder strength without upper trap compensation to improve ability to lift and carry groceries.  Baseline: see above Goal status: INITIAL  3.  Patient will score </=42% impairment on the NDI to signify clinically meaningful improvement in functional abilities.  Baseline:see abov Goal status: INITIAL  4.  Patient will report pain at worst rated as 6/10 to improve her sleep quality.  Baseline: 10/10 in the evening/bed time.  Goal status: INITIAL  5.  Patient will demonstrate normalized thoracic ROM without increased pain to improve ability to complete bending and lifting tasks.  Baseline: see above Goal status: INITIAL     PLAN: PT FREQUENCY: 1x/week  PT DURATION: 6 weeks  PLANNED INTERVENTIONS: Therapeutic exercises, Therapeutic activity, Neuromuscular re-education, Patient/Family education, Self Care, Dry Needling, Electrical stimulation, Spinal manipulation, Spinal mobilization, Cryotherapy, Moist heat, Taping, Manual therapy, and Re-evaluation  PLAN FOR NEXT SESSION: review HEP, manual/TPDN to rt periscapular musculature, postural strengthening, thoracic mobility   Letitia Libra, PT, DPT, ATC 04/27/22 1:27 PM

## 2022-04-27 ENCOUNTER — Ambulatory Visit: Payer: Medicaid Other | Attending: Orthopaedic Surgery

## 2022-04-27 DIAGNOSIS — M549 Dorsalgia, unspecified: Secondary | ICD-10-CM | POA: Insufficient documentation

## 2022-04-27 DIAGNOSIS — M542 Cervicalgia: Secondary | ICD-10-CM | POA: Insufficient documentation

## 2022-04-27 DIAGNOSIS — R293 Abnormal posture: Secondary | ICD-10-CM | POA: Diagnosis present

## 2022-04-27 DIAGNOSIS — M546 Pain in thoracic spine: Secondary | ICD-10-CM | POA: Insufficient documentation

## 2022-04-27 DIAGNOSIS — M6281 Muscle weakness (generalized): Secondary | ICD-10-CM | POA: Insufficient documentation

## 2022-05-04 ENCOUNTER — Ambulatory Visit: Payer: Medicaid Other

## 2022-05-10 NOTE — Therapy (Signed)
OUTPATIENT PHYSICAL THERAPY TREATMENT NOTE   Patient Name: Courtney [name] MRN: [id number] DOB:1991/10/06, 31 y.o., female Today's Date: 05/11/2022  PCP: Brock Bad, MD REFERRING PROVIDER: Valeria Batman, MD  END OF SESSION:   PT End of Session - 05/11/22 1233     Visit Number 2    Number of Visits 7    Date for PT Re-Evaluation 06/12/22    Authorization Type MCD    Authorization Time Period 7/18-05/18/22    Authorization - Visit Number 1    Authorization - Number of Visits 3    PT Start Time 1233    PT Stop Time 1313    PT Time Calculation (min) 40 min    Activity Tolerance Patient tolerated treatment well    Behavior During Therapy [facility] for tasks assessed/performed             Past Medical History:  Diagnosis Date   Back pain    Eczema    Gall stones    Past Surgical History:  Procedure Laterality Date   gallstones removed     Patient Active Problem List   Diagnosis Date Noted   Cervicalgia 02/11/2022   Upper back pain 04/08/2020   Indication for care in labor or delivery 09/27/2013   Normal delivery 09/27/2013   Right orbit fracture (HCC) 08/10/2013   Eczema 04/17/2013   Mild hyperemesis gravidarum, antepartum 02/19/2013   General counseling for initiation of other contraceptive measures 01/02/2013   Anemia, iron deficiency 05/30/2012    REFERRING DIAG:  M54.9 (ICD-10-CM) - Upper back pain  M54.2 (ICD-10-CM) - Cervicalgia    THERAPY DIAG:  Pain in thoracic spine  Muscle weakness (generalized)  Abnormal posture  Cervicalgia  Rationale for Evaluation and Treatment Rehabilitation  PERTINENT HISTORY: none   PRECAUTIONS: none  SUBJECTIVE: Patient reports she is still feeling the same. She reports compliance with HEP.   PAIN:  Are you having pain? Yes: NPRS scale: 4/10 Pain location: Rt periscapular region (between T-spine and medial scapular border) Pain description: pinching Aggravating factors: driving, exercise,  sleeping Relieving factors: medication   OBJECTIVE: (objective measures completed at initial evaluation unless otherwise dated)   DIAGNOSTIC FINDINGS:  Cervical MRI: IMPRESSION: No evidence of acute abnormality or significant stenosis.   PATIENT SURVEYS:  NDI 50% disability     COGNITION: Overall cognitive status: Within functional limits for tasks assessed     SENSATION: Not tested   POSTURE: rounded shoulders and forward head   PALPATION: TTP Rt upper trap, levator scapulae, rhomboids, middle trap with trigger points noted throughout  Hypomobility and pain with PAIVM upper/mid T-spine    05/11/22: Trigger points and palpable tenderness present about Rt upper trap, levator scapulae, middle trap, and rhomboids      CERVICAL ROM:    Active ROM A/PROM (deg) eval  Flexion 50  Extension 20 pn!  Right lateral flexion WNL  Left lateral flexion WNL  Right rotation WNL  Left rotation WNL   (Blank rows = not tested)   THORACIC ROM:   Active ROM A/PROM (deg) eval  Flexion 25% limited pn  Extension WNL pn  Right lateral flexion WNL pn  Left lateral flexion WNL  Right rotation WNL  Left rotation WNL    UPPER EXTREMITY ROM:   Active ROM Right eval Left eval  Shoulder flexion WNL pn WNL  Shoulder extension      Shoulder abduction 140 pn WNL  Shoulder adduction      Shoulder extension  Shoulder internal rotation WNL WNL  Shoulder external rotation WNL WNL  Elbow flexion      Elbow extension      Wrist flexion      Wrist extension      Wrist ulnar deviation      Wrist radial deviation      Wrist pronation      Wrist supination       (Blank rows = not tested)   UPPER EXTREMITY MMT:   MMT Right eval Left eval  Shoulder flexion 4+ 5  Shoulder extension      Shoulder abduction 4 upper trap compensation 5  Shoulder adduction      Shoulder extension      Shoulder internal rotation      Shoulder external rotation      Middle trapezius 3+ 4-  Lower  trapezius      Elbow flexion      Elbow extension      Wrist flexion      Wrist extension      Wrist ulnar deviation      Wrist radial deviation      Wrist pronation      Wrist supination      Grip strength       (Blank rows = not tested)   CERVICAL SPECIAL TESTS:  Spurling's test: Negative and Distraction test: Negative     FUNCTIONAL TESTS:  N/A     TODAY'S TREATMENT:  [facility] Adult PT Treatment:                                                DATE: 05/11/22 Therapeutic Exercise: Prone scapular retraction 2 x 10  Bilateral upper trap stretch 2 x 30 sec Levator scapulae stretch 2 x 30 sec   Bilateral shoulder ER yellow band 2 x 10  Cat cow 1 x 10  Seated thoracic extension 1 x 10  Seated resisted horizontal abduction yellow band 1 x 10  Manual Therapy: STM/DTM/trigger point release Rt upper trap, levator scapulae, middle trap, rhomboids  Thoracic CPAs grade V upper/mid with cavitation   Trigger Point Dry Needling Treatment: Pre-treatment instruction: Patient instructed on dry needling rationale, procedures, and possible side effects including pain during treatment (achy,cramping feeling), bruising, drop of blood, lightheadedness, nausea, sweating. Patient Consent Given: Yes Education handout provided: Yes Muscles treated: Rt upper trap   Treatment response/outcome: Twitch response elicited and Palpable decrease in muscle tension Post-treatment instructions: Patient instructed to expect possible mild to moderate muscle soreness later today and/or tomorrow. Patient instructed in methods to reduce muscle soreness and to continue prescribed HEP. If patient was dry needled over the lung field, patient was instructed on signs and symptoms of pneumothorax and, however unlikely, to see immediate medical attention should they occur. Patient was also educated on signs and symptoms of infection and to seek medical attention should they occur. Patient verbalized understanding of these  instructions and education.   [facility] Adult PT Treatment:                                                DATE: 04/26/22 Therapeutic Exercise: Demonstrated and issue initial HEP.    Therapeutic Activity: Education on assessment findings that will  be addressed throughout duration of POC.          PATIENT EDUCATION:  Education details: see treatment  Person educated: Patient Education method: Explanation, Handouts Education comprehension: verbalized understanding     HOME EXERCISE PROGRAM: Access Code: [email or web address] URL: [email or web address] Date: 04/27/2022 Prepared by: Gwendolyn Grant   Exercises - Seated Thoracic Lumbar Extension  - 2 x daily - 7 x weekly - 1 sets - 10 reps - Sidelying Thoracic Rotation with Open Book  - 2 x daily - 7 x weekly - 1 sets - 10 reps - Cat Cow  - 2 x daily - 7 x weekly - 1 sets - 10 reps   ASSESSMENT:   CLINICAL IMPRESSION: Session focused on reducing myofascial restriction about the Rt periscapular musculature. She is noted to have multiple trigger points throughout this region with TPDN performed to the Rt upper trap and trigger point release performed to other musculature. Excellent twitch response and reduced tautness noted in upper trap post-intervention. Trigger points remain most notable in the rhomboids, so patient will potentially benefit from TPDN to this musculature at future sessions. Began postural strengthening today, which she tolerated well, though fatigued quickly. She reported soreness in her upper trap at conclusion of session, which is to be expected. HEP reviewed with patient requiring cues for proper set up of cat cow and seated thoracic extension.      OBJECTIVE IMPAIRMENTS decreased activity tolerance, decreased ROM, decreased strength, hypomobility, increased fascial restrictions, impaired UE functional use, postural dysfunction, and pain.    ACTIVITY LIMITATIONS carrying, lifting, sleeping, and reach over head    PARTICIPATION LIMITATIONS: meal prep, cleaning, laundry, driving, shopping, community activity, occupation, and yard work   PERSONAL FACTORS Past/current experiences and Time since onset of injury/illness/exacerbation are also affecting patient's functional outcome.    REHAB POTENTIAL: Fair previously completed PT and chiropractic treatment for same condition without relief   CLINICAL DECISION MAKING: Stable/uncomplicated   EVALUATION COMPLEXITY: Low     GOALS: Goals reviewed with patient? No   SHORT TERM GOALS: Target date: 05/18/2022    Patient will be independent and compliant with initial HEP.    Baseline: issued at eval  Goal status: INITIAL   2.  Patient will demonstrate knowledge and application of appropriate sitting posture to reduce stress on her neck/back.  Baseline: see above Goal status: INITIAL   3.  Patient will demonstrate at least 30 degrees of cervical extension AROM without an increase in pain to improve ability to complete self-care activities.  Baseline: see above  Goal status: INITIAL     LONG TERM GOALS: Target date: 06/08/2022   Patient will demonstrate at least 4/5 bilateral middle trap strength to improve postural stability.  Baseline: see above  Goal status: INITIAL   2.  Patient will demonstrate 5/5 Rt shoulder strength without upper trap compensation to improve ability to lift and carry groceries.  Baseline: see above Goal status: INITIAL   3.  Patient will score </=42% impairment on the NDI to signify clinically meaningful improvement in functional abilities.  Baseline:see abov Goal status: INITIAL   4.  Patient will report pain at worst rated as 6/10 to improve her sleep quality.  Baseline: 10/10 in the evening/bed time.  Goal status: INITIAL   5.  Patient will demonstrate normalized thoracic ROM without increased pain to improve ability to complete bending and lifting tasks.  Baseline: see above Goal status: INITIAL          PLAN: PT FREQUENCY:  1x/week   PT DURATION: 6 weeks   PLANNED INTERVENTIONS: Therapeutic exercises, Therapeutic activity, Neuromuscular re-education, Patient/Family education, Self Care, Dry Needling, Electrical stimulation, Spinal manipulation, Spinal mobilization, Cryotherapy, Moist heat, Taping, Manual therapy, and Re-evaluation   PLAN FOR NEXT SESSION: review HEP, manual/TPDN to rt periscapular musculature, postural strengthening, thoracic mobility   Gwendolyn Grant, PT, DPT, ATC 05/11/22 1:17 PM

## 2022-05-11 ENCOUNTER — Ambulatory Visit: Payer: Medicaid Other | Attending: Orthopaedic Surgery

## 2022-05-11 DIAGNOSIS — R293 Abnormal posture: Secondary | ICD-10-CM | POA: Diagnosis present

## 2022-05-11 DIAGNOSIS — M542 Cervicalgia: Secondary | ICD-10-CM | POA: Insufficient documentation

## 2022-05-11 DIAGNOSIS — M6281 Muscle weakness (generalized): Secondary | ICD-10-CM | POA: Insufficient documentation

## 2022-05-11 DIAGNOSIS — M546 Pain in thoracic spine: Secondary | ICD-10-CM | POA: Insufficient documentation

## 2022-05-11 NOTE — Patient Instructions (Signed)

## 2022-05-18 ENCOUNTER — Telehealth: Payer: Self-pay

## 2022-05-18 ENCOUNTER — Ambulatory Visit: Payer: Medicaid Other

## 2022-05-18 NOTE — Telephone Encounter (Signed)
LVM regarding missed PT appointment. Reviewed attendance policy and reminded patient of next scheduled appointment.   Letitia Libra, PT, DPT, ATC 05/18/22 12:59 PM

## 2022-05-25 ENCOUNTER — Ambulatory Visit: Payer: Medicaid Other

## 2022-06-01 ENCOUNTER — Telehealth: Payer: Self-pay

## 2022-06-01 NOTE — Telephone Encounter (Signed)
LVM regarding missed PT appointment. Reviewed attendance policy. Informed patient that she can schedule one appointment at a time moving forward and to call our office if she would like to schedule an additional visit.   Letitia Libra, PT, DPT, ATC 06/01/22 4:30 PM

## 2022-06-04 ENCOUNTER — Ambulatory Visit: Payer: Medicaid Other

## 2022-06-04 DIAGNOSIS — M546 Pain in thoracic spine: Secondary | ICD-10-CM | POA: Diagnosis not present

## 2022-06-04 DIAGNOSIS — R293 Abnormal posture: Secondary | ICD-10-CM

## 2022-06-04 DIAGNOSIS — M6281 Muscle weakness (generalized): Secondary | ICD-10-CM

## 2022-06-04 DIAGNOSIS — M542 Cervicalgia: Secondary | ICD-10-CM

## 2022-06-04 NOTE — Therapy (Addendum)
OUTPATIENT PHYSICAL THERAPY TREATMENT NOTE/RE-EVALUATION PHYSICAL THERAPY DISCHARGE SUMMARY  Visits from Start of Care: 3  Current functional level related to goals / functional outcomes: See goals below    Remaining deficits: Status unknown   Education / Equipment: N/A   Patient agrees to discharge. Patient goals were partially met. Patient is being discharged due to not returning since the last visit.   Patient Name: Courtney [name] MRN: [id number] DOB:10-Dec-1990, 31 y.o., female Today's Date: 06/04/2022  PCP: Shelly Bombard, MD REFERRING PROVIDER: Garald Balding, MD  END OF SESSION:   PT End of Session - 06/04/22 1017     Visit Number 3    Number of Visits 9    Date for PT Re-Evaluation 07/17/22    Authorization Type MCD- out of auth    PT Start Time 1017    PT Stop Time 1057    PT Time Calculation (min) 40 min    Activity Tolerance Patient tolerated treatment well    Behavior During Therapy [facility] for tasks assessed/performed              Past Medical History:  Diagnosis Date   Back pain    Eczema    Gall stones    Past Surgical History:  Procedure Laterality Date   gallstones removed     Patient Active Problem List   Diagnosis Date Noted   Cervicalgia 02/11/2022   Upper back pain 04/08/2020   Indication for care in labor or delivery 09/27/2013   Normal delivery 09/27/2013   Right orbit fracture ([place]) 08/10/2013   Eczema 04/17/2013   Mild hyperemesis gravidarum, antepartum 02/19/2013   General counseling for initiation of other contraceptive measures 01/02/2013   Anemia, iron deficiency 05/30/2012    REFERRING DIAG:  M54.9 (ICD-10-CM) - Upper back pain  M54.2 (ICD-10-CM) - Cervicalgia    THERAPY DIAG:  Pain in thoracic spine  Muscle weakness (generalized)  Abnormal posture  Cervicalgia  Rationale for Evaluation and Treatment Rehabilitation  PERTINENT HISTORY: none   PRECAUTIONS: none  SUBJECTIVE: Patient reports the  needling really helped her upper trap and hasn't had pain in this area since needling was performed. She still gets a lot of pain along the Rt shoulder blade area.   PAIN:  Are you having pain? Yes: NPRS scale: 6/10 Pain location: Rt periscapular region (between T-spine and medial scapular border) Pain description: pinching Aggravating factors: driving, exercise, sleeping Relieving factors: medication   OBJECTIVE: (objective measures completed at initial evaluation unless otherwise dated)   DIAGNOSTIC FINDINGS:  Cervical MRI: IMPRESSION: No evidence of acute abnormality or significant stenosis.   PATIENT SURVEYS:  NDI 50% disability 06/04/22: 32% disability      COGNITION: Overall cognitive status: Within functional limits for tasks assessed     SENSATION: Not tested   POSTURE: rounded shoulders and forward head  06/04/22: able to self-correct seated posture    PALPATION: TTP Rt upper trap, levator scapulae, rhomboids, middle trap with trigger points noted throughout  Hypomobility and pain with PAIVM upper/mid T-spine    05/11/22: Trigger points and palpable tenderness present about Rt upper trap, levator scapulae, middle trap, and rhomboids   06/04/22:  Trigger points and palpable tenderness about Rt levator scapulae, middle trap, and rhomboids; hypomobility and pain with PAIVM upper/mid T-spine    CERVICAL ROM:    Active ROM A/PROM (deg) eval 06/04/22  Flexion 50 55  Extension 20 pn! 55 pn!  Right lateral flexion WNL WNL  Left lateral flexion WNL WNL  Right rotation WNL WNL  Left rotation WNL WNL   (Blank rows = not tested)   THORACIC ROM:   Active ROM A/PROM (deg) eval 06/04/22  Flexion 25% limited pn WNL pn!  Extension WNL pn WNL  Right lateral flexion WNL pn WNL  Left lateral flexion WNL WNL  Right rotation WNL WNL  Left rotation WNL WNL    UPPER EXTREMITY ROM:   Active ROM Right eval Left eval 06/04/22 Right   Shoulder flexion WNL pn WNL WNL   Shoulder extension       Shoulder abduction 140 pn WNL WNL  Shoulder adduction       Shoulder extension       Shoulder internal rotation WNL WNL WNL  Shoulder external rotation WNL WNL WNL  Elbow flexion       Elbow extension       Wrist flexion       Wrist extension       Wrist ulnar deviation       Wrist radial deviation       Wrist pronation       Wrist supination        (Blank rows = not tested)   UPPER EXTREMITY MMT:   MMT Right eval Left eval 06/04/22  Shoulder flexion 4+ 5 Rt: 5  Shoulder extension       Shoulder abduction 4 upper trap compensation 5 Rt: 4+/5   Shoulder adduction       Shoulder extension       Shoulder internal rotation       Shoulder external rotation       Middle trapezius 3+ 4- Rt: 3+; Lt: 4+   Lower trapezius       Elbow flexion       Elbow extension       Wrist flexion       Wrist extension       Wrist ulnar deviation       Wrist radial deviation       Wrist pronation       Wrist supination       Grip strength        (Blank rows = not tested)   CERVICAL SPECIAL TESTS:  Spurling's test: Negative and Distraction test: Negative     FUNCTIONAL TESTS:  N/A     TODAY'S TREATMENT:  [facility] Adult PT Treatment:                                                DATE: 05/18/22 Therapeutic Exercise: UBE level 1 x 4 min fwd  Sidelying thoracic rotation 1 x 10 each  Prone T 2 x 10  Thoracic extension on foam roller 1 x 10  Supine resisted shoulder horizontal abduction 2 x 10; red band Updated HEP Manual Therapy: STM/DTM bilateral periscapular musculature Upper/mid thoracic CPAs grade III-V Scapular mobilization upward/downward rotation and elevation/depression  Trigger Point Dry Needling Treatment: Pre-treatment instruction: Patient instructed on dry needling rationale, procedures, and possible side effects including pain during treatment (achy,cramping feeling), bruising, drop of blood, lightheadedness, nausea, sweating. Patient Consent Given:  Yes Education handout provided: Previously provided Muscles treated: Rt rhomboids shelf technique   Treatment response/outcome: Palpable decrease in muscle tension Post-treatment instructions: Patient instructed to expect possible mild to moderate muscle soreness later today and/or tomorrow. Patient instructed in methods to reduce  muscle soreness and to continue prescribed HEP. If patient was dry needled over the lung field, patient was instructed on signs and symptoms of pneumothorax and, however unlikely, to see immediate medical attention should they occur. Patient was also educated on signs and symptoms of infection and to seek medical attention should they occur. Patient verbalized understanding of these instructions and education.   [facility] Adult PT Treatment:                                                DATE: 05/11/22 Therapeutic Exercise: Prone scapular retraction 2 x 10  Bilateral upper trap stretch 2 x 30 sec Levator scapulae stretch 2 x 30 sec   Bilateral shoulder ER yellow band 2 x 10  Cat cow 1 x 10  Seated thoracic extension 1 x 10  Seated resisted horizontal abduction yellow band 1 x 10  Manual Therapy: STM/DTM/trigger point release Rt upper trap, levator scapulae, middle trap, rhomboids  Thoracic CPAs grade V upper/mid with cavitation   Trigger Point Dry Needling Treatment: Pre-treatment instruction: Patient instructed on dry needling rationale, procedures, and possible side effects including pain during treatment (achy,cramping feeling), bruising, drop of blood, lightheadedness, nausea, sweating. Patient Consent Given: Yes Education handout provided: Yes Muscles treated: Rt upper trap   Treatment response/outcome: Twitch response elicited and Palpable decrease in muscle tension Post-treatment instructions: Patient instructed to expect possible mild to moderate muscle soreness later today and/or tomorrow. Patient instructed in methods to reduce muscle soreness and to continue  prescribed HEP. If patient was dry needled over the lung field, patient was instructed on signs and symptoms of pneumothorax and, however unlikely, to see immediate medical attention should they occur. Patient was also educated on signs and symptoms of infection and to seek medical attention should they occur. Patient verbalized understanding of these instructions and education.            PATIENT EDUCATION:  Education details: see treatment  Person educated: Patient Education method: Explanation, Handouts, demo, cues Education comprehension: verbalized understanding, returned demo, cues      HOME EXERCISE PROGRAM: Access Code: [email or web address] URL: [email or web address] Date: 04/27/2022 Prepared by: Gwendolyn Grant   Exercises - Seated Thoracic Lumbar Extension  - 2 x daily - 7 x weekly - 1 sets - 10 reps - Sidelying Thoracic Rotation with Open Book  - 2 x daily - 7 x weekly - 1 sets - 10 reps - Cat Cow  - 2 x daily - 7 x weekly - 1 sets - 10 reps   ASSESSMENT:   CLINICAL IMPRESSION: Patient has attended 2 treatment sessions since her initial evaluation on 04/27/22. She demonstrates improvements in cervical, thoracic, and shoulder AROM, but continues to report periscapular pain at end range of cervical extension and thoracic flexion AROM. She is noted to have ongoing myofascial restriction about her Rt periscapular musculature as well as hypomobility about the mid/upper T-spine. Her shoulder strength has improved, though she has ongoing weakness about her periscapular musculature more so on the Rt vs. Lt.  She will benefit from consistent skilled PT 1x week for 6 weeks to address her lingering mobility and myofascial restrictions and postural strength deficits in order to optimize her function and reduce her pain.      OBJECTIVE IMPAIRMENTS decreased activity tolerance, decreased ROM, decreased strength, hypomobility, increased fascial restrictions, impaired UE functional use,  postural dysfunction, and pain.    ACTIVITY LIMITATIONS carrying, lifting, sleeping, and reach over head   PARTICIPATION LIMITATIONS: meal prep, cleaning, laundry, driving, shopping, community activity, occupation, and yard work   PERSONAL FACTORS Past/current experiences and Time since onset of injury/illness/exacerbation are also affecting patient's functional outcome.    REHAB POTENTIAL: Fair previously completed PT and chiropractic treatment for same condition without relief   CLINICAL DECISION MAKING: Stable/uncomplicated   EVALUATION COMPLEXITY: Low     GOALS: Goals reviewed with patient? No   SHORT TERM GOALS: Target date: 05/18/2022    Patient will be independent and compliant with initial HEP.    Baseline: issued at eval  Goal status: achieved    2.  Patient will demonstrate knowledge and application of appropriate sitting posture to reduce stress on her neck/back.  Baseline: see above Goal status: achieved    3.  Patient will demonstrate at least 30 degrees of cervical extension AROM without an increase in pain to improve ability to complete self-care activities.  Baseline: see above  Goal status: partially met      LONG TERM GOALS: Target date: 06/08/2022   Patient will demonstrate at least 4/5 bilateral middle trap strength to improve postural stability.  Baseline: see above  Goal status: partially met    2.  Patient will demonstrate 5/5 Rt shoulder strength without upper trap compensation to improve ability to lift and carry groceries.  Baseline: see above Goal status: partially met    3.  Patient will score </=42% impairment on the NDI to signify clinically meaningful improvement in functional abilities.  Baseline:see above Goal status: met   4.  Patient will report pain at worst rated as 6/10 to improve her sleep quality.  Baseline: 10/10 in the evening/bed time; 06/04/22: can still reach 10/10  Goal status: ongoing    5.  Patient will demonstrate  normalized thoracic ROM without increased pain to improve ability to complete bending and lifting tasks.  Baseline: see above Goal status: partially met          PLAN: PT FREQUENCY: 1x/week   PT DURATION: 6 weeks   PLANNED INTERVENTIONS: Therapeutic exercises, Therapeutic activity, Neuromuscular re-education, Patient/Family education, Self Care, Dry Needling, Electrical stimulation, Spinal manipulation, Spinal mobilization, Cryotherapy, Moist heat, Taping, Manual therapy, and Re-evaluation   PLAN FOR NEXT SESSION: review HEP, manual/TPDN to rt periscapular musculature, postural strengthening, thoracic mobility   Gwendolyn Grant, PT, DPT, ATC 06/04/22 10:59 AM  Gwendolyn Grant, PT, DPT, ATC 07/07/22 2:46 PM

## 2023-03-28 ENCOUNTER — Encounter: Payer: Self-pay | Admitting: *Deleted

## 2023-03-28 ENCOUNTER — Ambulatory Visit
Admission: EM | Admit: 2023-03-28 | Discharge: 2023-03-28 | Disposition: A | Discharge status: Home / Self Care | Payer: Medicaid Other | Attending: Internal Medicine | Admitting: Internal Medicine

## 2023-03-28 DIAGNOSIS — G8929 Other chronic pain: Secondary | ICD-10-CM

## 2023-03-28 DIAGNOSIS — M546 Pain in thoracic spine: Secondary | ICD-10-CM

## 2023-03-28 MED ORDER — BACLOFEN 10 MG PO TABS
10.0000 mg | ORAL_TABLET | Freq: Three times a day (TID) | ORAL | 0 refills | Status: DC
Start: 2023-03-28 — End: 2023-08-09

## 2023-03-28 MED ORDER — PREDNISONE 20 MG PO TABS: 40.0000 mg | ORAL_TABLET | Freq: Every day | ORAL | 0 refills | Status: AC

## 2023-03-28 NOTE — ED Provider Notes (Signed)
[facility]    CSN: [id number] Arrival date & time: 03/28/23  1332      History   Chief Complaint Chief Complaint  Patient presents with   Back Pain    HPI Courtney [name] is a 32 y.o. female.   Patient presents to urgent care for evaluation of acute on chronic midline upper thoracic pain with acute episode that started 4 days ago. This episode of upper back pain started gradually without recent trauma/injuries to the upper back. Pain is currently a 9/10 and described as sharp in nature. Movement makes pain worse. Nothing makes pain better. Upper back pain comes and goes in episodes for the last 3-4 years. She has been worked up for this in the past by orthopedics and neurology. She has received physical therapy and chiropractic care for back pain as well. She has also had dry needling in the past and says this helped the most. States pain affects her ability to sleep and work. She states they found some abnormal curvature of her spine on an x-ray during initial workup and MRI but they did not call this scoliosis as it "wasn't curved enough". Denies urinary symptoms, urinary/stool incontinence, abdominal pain, radicular pain to the low back, dizziness, sore throat, viral URI symptoms, fevers, chills, or chest pain. No cough or shortness of breath/heart palpitations. No paresthesias to the extremities or extremity weakness. No recent steroids. Tried tylenol, ibuprofen, and aleve and nothing is helping. States muscle relaxer used to help slightly but "only put her to sleep". She has not had any muscle relaxer recently. Heat helps temporarily but not much. Stopped PT 05/2022 and last visit with orthopedics was June 2023.    Back Pain   Past Medical History:  Diagnosis Date   Back pain    Eczema    Gall stones     Patient Active Problem List   Diagnosis Date Noted   Cervicalgia 02/11/2022   Upper back pain 04/08/2020   Indication for care in labor or delivery 09/27/2013    Normal delivery 09/27/2013   Right orbit fracture ([facility]) 08/10/2013   Eczema 04/17/2013   Mild hyperemesis gravidarum, antepartum 02/19/2013   General counseling for initiation of other contraceptive measures 01/02/2013   Anemia, iron deficiency 05/30/2012    Past Surgical History:  Procedure Laterality Date   gallstones removed      OB History     Gravida  4   Para  3   Term  3   Preterm  0   AB  1   Living  3      SAB  0   IAB  1   Ectopic  0   Multiple  0   Live Births  3            Home Medications    Prior to Admission medications   Medication Sig Start Date End Date Taking? Authorizing Provider  baclofen (LIORESAL) 10 MG tablet Take 1 tablet (10 mg total) by mouth 3 (three) times daily. 03/28/23  Yes Carlisle Beers, FNP  predniSONE (DELTASONE) 20 MG tablet Take 2 tablets (40 mg total) by mouth daily for 5 days. 03/28/23 04/02/23 Yes Carlisle Beers, FNP  amoxicillin-clavulanate (AUGMENTIN) 875-125 MG tablet Take 1 tablet by mouth every 12 (twelve) hours. 12/30/21   Tomi Bamberger, PA-C  hydrocortisone 2.5 % cream Apply topically 2 (two) times daily. 12/30/20   Brock Bad, MD  medroxyPROGESTERone (DEPO-PROVERA) 150 MG/ML injection Inject 1 mL (  150 mg total) into the muscle every 3 (three) months. 12/30/20   Brock Bad, MD    Family History Family History  Problem Relation Age of Onset   Diabetes Mother    Healthy Father     Social History Social History   Tobacco Use   Smoking status: Every Day    Packs/day: 0.25    Years: 2.00    Additional pack years: 0.00    Total pack years: 0.50    Types: Cigarettes   Smokeless tobacco: Never   Tobacco comments:    8 per day   Vaping Use   Vaping Use: Never used  Substance Use Topics   Alcohol use: Not Currently    Comment: every 2-3 days   Drug use: No     Allergies   Patient has no known allergies.   Review of Systems Review of Systems  Musculoskeletal:   Positive for back pain.  Per HPI   Physical Exam Triage Vital Signs ED Triage Vitals  Enc Vitals Group     BP 03/28/23 1343 112/70     Pulse Rate 03/28/23 1343 96     Resp 03/28/23 1343 16     Temp 03/28/23 1343 98.2 F (36.8 C)     Temp Source 03/28/23 1343 Oral     SpO2 03/28/23 1343 97 %     Weight --      Height --      Head Circumference --      Peak Flow --      Pain Score 03/28/23 1344 9     Pain Loc --      Pain Edu? --      Excl. in GC? --    No data found.  Updated Vital Signs BP 112/70   Pulse 96   Temp 98.2 F (36.8 C) (Oral)   Resp 16   LMP 03/20/2023 (Approximate)   SpO2 97%   Visual Acuity Right Eye Distance:   Left Eye Distance:   Bilateral Distance:    Right Eye Near:   Left Eye Near:    Bilateral Near:     Physical Exam Vitals and nursing note reviewed.  Constitutional:      Appearance: She is not ill-appearing or toxic-appearing.  HENT:     Head: Normocephalic and atraumatic.     Right Ear: Hearing and external ear normal.     Left Ear: Hearing and external ear normal.     Nose: Nose normal.     Mouth/Throat:     Lips: Pink.  Eyes:     General: Lids are normal. Vision grossly intact. Gaze aligned appropriately.     Extraocular Movements: Extraocular movements intact.     Conjunctiva/sclera: Conjunctivae normal.  Cardiovascular:     Rate and Rhythm: Normal rate and regular rhythm.     Heart sounds: Normal heart sounds, S1 normal and S2 normal.  Pulmonary:     Effort: Pulmonary effort is normal. No respiratory distress.     Breath sounds: Normal breath sounds and air entry.  Musculoskeletal:     Cervical back: Normal and neck supple.     Thoracic back: Tenderness and bony tenderness present. No swelling, edema, deformity, signs of trauma, lacerations or spasms. Normal range of motion. No scoliosis.     Lumbar back: Normal.       Back:     Comments: TTP to the upper thoracic spinous processes and bilateral paraspinals to the  thoracic spine. No signs  of injury. No overlying rash.   Skin:    General: Skin is warm and dry.     Capillary Refill: Capillary refill takes less than 2 seconds.     Findings: No rash.  Neurological:     General: No focal deficit present.     Mental Status: She is alert and oriented to person, place, and time. Mental status is at baseline.     Cranial Nerves: Cranial nerves 2-12 are intact. No dysarthria or facial asymmetry.     Sensory: Sensation is intact.     Motor: Motor function is intact.     Coordination: Coordination is intact.     Gait: Gait is intact.     Comments: Strength and sensation intact to bilateral upper and lower extremities (5/5). Moves all 4 extremities with normal coordination voluntarily. Non-focal neuro exam.   Psychiatric:        Mood and Affect: Mood normal.        Speech: Speech normal.        Behavior: Behavior normal.        Thought Content: Thought content normal.        Judgment: Judgment normal.      UC Treatments / Results  Labs (all labs ordered are listed, but only abnormal results are displayed) Labs Reviewed - No data to display  EKG   Radiology No results found.  Procedures Procedures (including critical care time)  Medications Ordered in UC Medications - No data to display  Initial Impression / Assessment and Plan / UC Course  I have reviewed the triage vital signs and the nursing notes.  Pertinent labs & imaging results that were available during my care of the patient were reviewed by me and considered in my medical decision making (see chart for details).   1. Chronic bilateral thoracic back pain Acute exacerbation of chronic back pain has not responded well to as needed use of over the counter medications, therefore will use prednisone burst 40mg  once daily in the morning with breakfast for 5 days. No NSAIDs when using prednisone. Tylenol as needed for pain. Continued use of heat compresses as needed recommended. Baclofen  muscle relaxer every 8 hours as need for muscle spasm, drowsiness precautions discussed. Recommend follow-up with orthopedics Dr. Cleophas Dunker as she is an established patient there. She may also benefit from workup with spinal specialist, however she has medicaid and will need a formal referral. Discussed this with patient and she plans to discuss this with Dr. Cleophas Dunker as well as PCP. No red flag signs/symptoms indicating need for ED referral.   Discussed physical exam and available lab work findings in clinic with patient.  Counseled patient regarding appropriate use of medications and potential side effects for all medications recommended or prescribed today. Discussed red flag signs and symptoms of worsening condition,when to call the PCP office, return to urgent care, and when to seek higher level of care in the emergency department. Patient verbalizes understanding and agreement with plan. All questions answered. Patient discharged in stable condition.    Final Clinical Impressions(s) / UC Diagnoses   Final diagnoses:  Chronic bilateral thoracic back pain     Discharge Instructions      Your pain has not responded well to over the counter medications, therefore we will use a steroid to help with pain.  Take prednisone 40mg  once a day for 5 days with breakfast. Take this with food to avoid stomach upset. Do not take ibuprofen/aleve when taking this as  this is a similar medicine.   Take baclofen muscle relaxer at bedtime as needed for muscle spasm. Do not take this medicine and drive/drink alcohol as this can make you very sleepy.  Please schedule an appointment with Dr. Cleophas Dunker at [facility] for follow-up.  If you develop any new or worsening symptoms or do not improve in the next 2 to 3 days, please return.  If your symptoms are severe, please go to the emergency room.  Follow-up with your primary care provider for further evaluation and management of your symptoms as well as  ongoing wellness visits.  I hope you feel better!     ED Prescriptions     Medication Sig Dispense Auth. Provider   predniSONE (DELTASONE) 20 MG tablet Take 2 tablets (40 mg total) by mouth daily for 5 days. 10 tablet Carlisle Beers, FNP   baclofen (LIORESAL) 10 MG tablet Take 1 tablet (10 mg total) by mouth 3 (three) times daily. 30 each Carlisle Beers, FNP      PDMP not reviewed this encounter.   Carlisle Beers, [place] 03/28/23 1415

## 2023-03-28 NOTE — ED Triage Notes (Signed)
Denies injury. C/O mid upper back pain chronically - reports having had MRIs, PT, seeing chiropractor in past without relief. States this flare-up of pain started approx 4 days ago.

## 2023-03-28 NOTE — Discharge Instructions (Addendum)
Your pain has not responded well to over the counter medications, therefore we will use a steroid to help with pain.  Take prednisone 40mg  once a day for 5 days with breakfast. Take this with food to avoid stomach upset. Do not take ibuprofen/aleve when taking this as this is a similar medicine.   Take baclofen muscle relaxer at bedtime as needed for muscle spasm. Do not take this medicine and drive/drink alcohol as this can make you very sleepy.  Please schedule an appointment with Dr. Cleophas Dunker at [facility] for follow-up.  If you develop any new or worsening symptoms or do not improve in the next 2 to 3 days, please return.  If your symptoms are severe, please go to the emergency room.  Follow-up with your primary care provider for further evaluation and management of your symptoms as well as ongoing wellness visits.  I hope you feel better!

## 2023-08-09 ENCOUNTER — Ambulatory Visit: Payer: Medicaid Other | Admitting: Advanced Practice Midwife

## 2023-08-09 ENCOUNTER — Other Ambulatory Visit (HOSPITAL_COMMUNITY)
Admission: RE | Admit: 2023-08-09 | Discharge: 2023-08-09 | Disposition: A | Discharge status: Home / Self Care | Payer: Medicaid Other | Source: Ambulatory Visit | Attending: Advanced Practice Midwife | Admitting: Advanced Practice Midwife

## 2023-08-09 ENCOUNTER — Encounter: Payer: Self-pay | Admitting: Advanced Practice Midwife

## 2023-08-09 VITALS — BP 117/72 | HR 84 | Ht 64.0 in | Wt 149.2 lb

## 2023-08-09 DIAGNOSIS — L309 Dermatitis, unspecified: Secondary | ICD-10-CM

## 2023-08-09 DIAGNOSIS — Z124 Encounter for screening for malignant neoplasm of cervix: Secondary | ICD-10-CM | POA: Diagnosis not present

## 2023-08-09 DIAGNOSIS — Z01419 Encounter for gynecological examination (general) (routine) without abnormal findings: Secondary | ICD-10-CM

## 2023-08-09 DIAGNOSIS — Z113 Encounter for screening for infections with a predominantly sexual mode of transmission: Secondary | ICD-10-CM | POA: Diagnosis present

## 2023-08-09 DIAGNOSIS — K640 First degree hemorrhoids: Secondary | ICD-10-CM

## 2023-08-09 DIAGNOSIS — R21 Rash and other nonspecific skin eruption: Secondary | ICD-10-CM | POA: Diagnosis not present

## 2023-08-09 MED ORDER — TRIAMCINOLONE ACETONIDE 0.1 % EX CREA: 1.0000 | TOPICAL_CREAM | Freq: Two times a day (BID) | CUTANEOUS | 1 refills | Status: DC

## 2023-08-09 MED ORDER — PREDNISONE 20 MG PO TABS: 40.0000 mg | ORAL_TABLET | Freq: Every day | ORAL | 0 refills | Status: AC

## 2023-08-09 MED ORDER — PROCTOFOAM HC 1-1 % EX FOAM: 1.0000 | Freq: Two times a day (BID) | CUTANEOUS | 0 refills | Status: DC

## 2023-08-09 NOTE — Progress Notes (Signed)
Pt presents for AEX.  Last PAP 12/2020 Requesting STD  Declines BC  Pt c/o eczema and back pain, requesting referral.

## 2023-08-09 NOTE — Progress Notes (Signed)
Subjective:     Courtney [name] is a 32 y.o. female here at [facility] for a routine exam.  Current complaints: rash on arms, chest, back; hemorrhoid.  Pt has seen dermatology and received a shampoo she was told to use on her skin which cleared it up but it came back. She can't go back to dermatology without a referral.  She denies any gyn problems or concerns.   Personal and family health history reviewed: yes.  Do you have a primary care provider? yes Do you feel safe at home? yes    Health Maintenance Due  Topic Date Due   Hepatitis C Screening  Never done   DTaP/Tdap/Td (1 - Tdap) Never done   INFLUENZA VACCINE  05/12/2023   COVID-19 Vaccine (1 - 2023-24 season) Never done     Risk factors for chronic health problems: Smoking: cigarettes Alchohol/how much: every 2-3 days Pt BMI: Body mass index is 25.61 kg/m.   Gynecologic History No LMP recorded. Contraception: condoms Last Pap: 12/30/20. Results were: normal Last mammogram: n/a. Results were: normal  Obstetric History OB History  Gravida Para Term Preterm AB Living  4 3 3  0 1 3  SAB IAB Ectopic Multiple Live Births  0 1 0 0 3    # Outcome Date GA Lbr Len/2nd Weight Sex Type Anes PTL Lv  4 Term 09/27/13 [redacted]w[redacted]d 05:33 / 01:25 7 lb 1.2 oz (3.209 kg) F Vag-Spont EPI  LIV  3 Term 07/05/12 [redacted]w[redacted]d 98:40 / 00:19 8 lb 2.5 oz (3.7 kg) F Vag-Spont EPI  LIV  2 Term 2009 109w0d 16:00 8 lb 5 oz (3.771 kg) M Vag-Spont EPI  LIV  1 IAB              The following portions of the patient's history were reviewed and updated as appropriate: allergies, current medications, past family history, past medical history, past social history, past surgical history, and problem list.  Review of Systems Pertinent items noted in HPI and remainder of comprehensive ROS otherwise negative.    Objective:  BP 117/72   Pulse 84   Ht 5\' 4"  (1.626 m)   Wt 149 lb 3.2 oz (67.7 kg)   BMI 25.61 kg/m   VS reviewed, nursing note reviewed,   Constitutional: well developed, well nourished, no distress HEENT: normocephalic, thyroid without enlargement or mass HEART: RRR, no murmurs rubs/gallops RESP: clear and equal to auscultation bilaterally in all lobes  Breast Exam:  exam performed: right breast normal without abnormal mass, skin or nipple changes or axillary nodes, left breast normal without abnormal mass, skin or nipple changes or axillary nodes. There was some fibrous tissue, ropelike, palpated bilaterally in upper outer quadrant of each breast. Abdomen: soft Neuro: alert and oriented x 3 Skin: warm, dry Psych: affect normal Pelvic exam: Performed: Cervix pink, visually closed, without lesion, scant white creamy discharge, vaginal walls and external genitalia normal Bimanual exam: Cervix 0/long/high, firm, anterior, neg CMT, uterus nontender, nonenlarged, adnexa without tenderness, enlargement, or mass        Assessment/Plan:   1. Screening for cervical cancer  - Cytology - PAP( [place])  2. Screening examination for STI  - Cervicovaginal ancillary only( ) - HIV antibody (with reflex) - Hepatitis C Antibody - Hepatitis B Surface AntiGEN - RPR  3. Encounter for annual routine gynecological examination   4. Body rash --Pt has eczema but this rash is more diffuse and looks different.   --Tx by Dermatology in the  past with a shampoo?  And cream which helped but the rash returned.   --Pt reports she had an episode of n/v a few weeks ago and could only keep down water for a few days.  Her skin cleared up.  After course of steroids, discussed option of diet changes. Pt may want to try an elimination diet, like the AIP diet with common allergens removed, then reintroduce foods to see if symptoms improve.   --Referral to Dermatology - predniSONE (DELTASONE) 20 MG tablet; Take 2 tablets (40 mg total) by mouth daily with breakfast for 5 days.  Dispense: 10 tablet; Refill: 0 - triamcinolone cream  (KENALOG) 0.1 %; Apply 1 Application topically 2 (two) times daily.  Dispense: 30 g; Refill: 1  5. Eczema, unspecified type --Hx eczema, diffuse rash not c/w eczema, more like contact dermatitis or fungal infection.  - predniSONE (DELTASONE) 20 MG tablet; Take 2 tablets (40 mg total) by mouth daily with breakfast for 5 days.  Dispense: 10 tablet; Refill: 0 - triamcinolone cream (KENALOG) 0.1 %; Apply 1 Application topically 2 (two) times daily.  Dispense: 30 g; Refill: 1  6. Grade I hemorrhoids  - hydrocortisone-pramoxine (PROCTOFOAM HC) rectal foam; Place 1 applicator rectally 2 (two) times daily.  Dispense: 20 g; Refill: 0     No follow-ups on file.   Sharen Counter, CNM 5:49 PM

## 2023-08-10 LAB — CERVICOVAGINAL ANCILLARY ONLY
Chlamydia: NEGATIVE
Comment: NEGATIVE
Comment: NEGATIVE
Comment: NORMAL
Neisseria Gonorrhea: NEGATIVE
Trichomonas: NEGATIVE

## 2023-08-29 ENCOUNTER — Other Ambulatory Visit: Payer: Self-pay

## 2023-08-29 DIAGNOSIS — R1031 Right lower quadrant pain: Secondary | ICD-10-CM | POA: Diagnosis present

## 2023-08-29 DIAGNOSIS — N941 Unspecified dyspareunia: Secondary | ICD-10-CM | POA: Diagnosis not present

## 2023-08-29 NOTE — ED Triage Notes (Signed)
C/o of lower abd pain x 2 days. Denies n/v/d.

## 2023-08-30 ENCOUNTER — Emergency Department (HOSPITAL_BASED_OUTPATIENT_CLINIC_OR_DEPARTMENT_OTHER)
Admission: EM | Admit: 2023-08-30 | Discharge: 2023-08-30 | Disposition: A | Discharge status: Home / Self Care | Payer: Medicaid Other | Attending: Emergency Medicine | Admitting: Emergency Medicine

## 2023-08-30 DIAGNOSIS — N941 Unspecified dyspareunia: Secondary | ICD-10-CM

## 2023-08-30 LAB — COMPREHENSIVE METABOLIC PANEL
ALT: 5 U/L (ref 0–44)
AST: 12 U/L — ABNORMAL LOW (ref 15–41)
Albumin: 4.2 g/dL (ref 3.5–5.0)
Alkaline Phosphatase: 56 U/L (ref 38–126)
Anion gap: 9 (ref 5–15)
BUN: 8 mg/dL (ref 6–20)
CO2: 24 mmol/L (ref 22–32)
Calcium: 9.8 mg/dL (ref 8.9–10.3)
Chloride: 106 mmol/L (ref 98–111)
Creatinine, Ser: 0.85 mg/dL (ref 0.44–1.00)
GFR, Estimated: >60 mL/min (ref >60)
Glucose, Bld: 98 mg/dL (ref 70–99)
Potassium: 3.4 mmol/L — ABNORMAL LOW (ref 3.5–5.1)
Sodium: 139 mmol/L (ref 135–145)
Total Bilirubin: 0.5 mg/dL (ref <1.2)
Total Protein: 7.1 g/dL (ref 6.5–8.1)

## 2023-08-30 LAB — PREGNANCY, URINE: Preg Test, Ur: NEGATIVE

## 2023-08-30 LAB — CBC
HCT: 36.5 % (ref 36.0–46.0)
Hemoglobin: 12.5 g/dL (ref 12.0–15.0)
MCH: 32.5 pg (ref 26.0–34.0)
MCHC: 34.2 g/dL (ref 30.0–36.0)
MCV: 94.8 fL (ref 80.0–100.0)
Platelets: 281 10*3/uL (ref 150–400)
RBC: 3.85 MIL/uL — ABNORMAL LOW (ref 3.87–5.11)
RDW: 12.7 % (ref 11.5–15.5)
WBC: 9.5 10*3/uL (ref 4.0–10.5)
nRBC: 0 % (ref 0.0–0.2)

## 2023-08-30 LAB — URINALYSIS, ROUTINE W REFLEX MICROSCOPIC
Bilirubin Urine: NEGATIVE
Glucose, UA: NEGATIVE mg/dL
Hgb urine dipstick: NEGATIVE
Ketones, ur: NEGATIVE mg/dL
Leukocytes,Ua: NEGATIVE
Nitrite: NEGATIVE
Specific Gravity, Urine: 1.024 (ref 1.005–1.030)
pH: 8.5 — ABNORMAL HIGH (ref 5.0–8.0)

## 2023-08-30 LAB — LIPASE, BLOOD: Lipase: 58 U/L — ABNORMAL HIGH (ref 11–51)

## 2023-08-30 NOTE — ED Provider Notes (Signed)
[facility] AT [facility] Provider Note   CSN: [id number] Arrival date & time: 08/29/23  2348     History  Chief Complaint  Patient presents with   Abdominal Pain    Courtney [name] is a 32 y.o. female.   Abdominal Pain    32 year old female presenting to the Emergency Department with pain during intercourse for the last 2 days.  The patient noted 2 days ago, pain came on during intercourse.  She endorsed bilateral lower abdominal discomfort.  Symptoms have since resolved.  She denies any nausea, vomiting, diarrhea.  She denies any dysuria or increased urinary frequency.  She was just tested for STIs 1 month ago and declines STI testing or empiric treatment today and is not concerned for STIs.  She denies any vaginal bleeding or vaginal discharge.  Her last menstrual period was 1 month ago.  Home Medications Prior to Admission medications   Medication Sig Start Date End Date Taking? Authorizing Provider  hydrocortisone-pramoxine (PROCTOFOAM HC) rectal foam Place 1 applicator rectally 2 (two) times daily. 08/09/23   Leftwich-Kirby, Wilmer Floor, CNM  triamcinolone cream (KENALOG) 0.1 % Apply 1 Application topically 2 (two) times daily. 08/09/23   Leftwich-Kirby, Wilmer Floor, CNM      Allergies    Patient has no known allergies.    Review of Systems   Review of Systems  Genitourinary:  Positive for pelvic pain.  All other systems reviewed and are negative.   Physical Exam Updated Vital Signs BP 109/75   Pulse 76   Temp 98 F (36.7 C)   Resp 16   Ht 5\' 4"  (1.626 m)   Wt 65.8 kg   SpO2 100%   BMI 24.89 kg/m  Physical Exam Vitals and nursing note reviewed.  Constitutional:      General: She is not in acute distress.    Appearance: She is well-developed.  HENT:     Head: Normocephalic and atraumatic.  Eyes:     Conjunctiva/sclera: Conjunctivae normal.  Cardiovascular:     Rate and Rhythm: Normal rate and regular rhythm.     Heart sounds: No  murmur heard. Pulmonary:     Effort: Pulmonary effort is normal. No respiratory distress.     Breath sounds: Normal breath sounds.  Abdominal:     Palpations: Abdomen is soft.     Tenderness: There is no abdominal tenderness. There is no guarding.     Comments: Abdomen soft, nontender, nondistended, no rebound or guarding  Musculoskeletal:        General: No swelling.     Cervical back: Neck supple.  Skin:    General: Skin is warm and dry.     Capillary Refill: Capillary refill takes less than 2 seconds.  Neurological:     Mental Status: She is alert.  Psychiatric:        Mood and Affect: Mood normal.     ED Results / Procedures / Treatments   Labs (all labs ordered are listed, but only abnormal results are displayed) Labs Reviewed  LIPASE, BLOOD - Abnormal; Notable for the following components:      Result Value   Lipase 58 (*)    All other components within normal limits  COMPREHENSIVE METABOLIC PANEL - Abnormal; Notable for the following components:   Potassium 3.4 (*)    AST 12 (*)    All other components within normal limits  CBC - Abnormal; Notable for the following components:   RBC 3.85 (*)  All other components within normal limits  URINALYSIS, ROUTINE W REFLEX MICROSCOPIC - Abnormal; Notable for the following components:   pH 8.5 (*)    Protein, ur TRACE (*)    All other components within normal limits  PREGNANCY, URINE    EKG None  Radiology No results found.  Procedures Procedures    Medications Ordered in ED Medications - No data to display  ED Course/ Medical Decision Making/ A&P                                 Medical Decision Making Amount and/or Complexity of Data Reviewed Labs: ordered.    32 year old female presenting to the Emergency Department with pain during intercourse for the last 2 days.  The patient noted 2 days ago, pain came on during intercourse.  She endorsed bilateral lower abdominal discomfort.  Symptoms have since  resolved.  She denies any nausea, vomiting, diarrhea.  She denies any dysuria or increased urinary frequency.  She was just tested for STIs 1 month ago and declines STI testing or empiric treatment today and is not concerned for STIs.  She denies any vaginal bleeding or vaginal discharge.  Her last menstrual period was 1 month ago.  On arrival, the patient is vitally stable.  Physical exam significant for an abdominal exam which was overall reassuring, abdomen is soft, nontender, nondistended, no rebound or guarding.  Screening labs were undertaken revealed a nonspecifically mildly elevated lipase at 58, urine pregnancy negative, urinalysis without evidence of UTI, negative for hematuria, CBC without a leukocytosis or anemia, CMP with only mild hypokalemia to 3.4, otherwise unremarkable.  The patient has a reassuring exam and is currently pain-free, low concern for acute intra-abdominal or pelvic abnormality.  Low concern for ovarian torsion.  Provided the patient with return precautions, advised NSAIDs for pain control, advised outpatient follow-up with her PCP or gynecologist.  Stable for discharge.    Final Clinical Impression(s) / ED Diagnoses Final diagnoses:  Dyspareunia in female    Rx / DC Orders ED Discharge Orders     None         Ernie Avena, MD 08/30/23 [phone]

## 2023-08-30 NOTE — ED Notes (Signed)
Reviewed AVS with patient, patient expressed understanding of directions, denies further questions at this time. 

## 2023-08-30 NOTE — Discharge Instructions (Addendum)
Follow-up with your primary care physician or your gynecologist regarding your painful intercourse.  Your laboratory evaluation and physical exam was overall reassuring today.

## 2023-09-07 ENCOUNTER — Encounter: Payer: Self-pay | Admitting: Obstetrics

## 2023-09-07 ENCOUNTER — Ambulatory Visit (INDEPENDENT_AMBULATORY_CARE_PROVIDER_SITE_OTHER): Payer: Medicaid Other | Admitting: Obstetrics

## 2023-09-07 VITALS — BP 117/79 | HR 90 | Ht 64.0 in | Wt 152.7 lb

## 2023-09-07 DIAGNOSIS — R102 Pelvic and perineal pain: Secondary | ICD-10-CM

## 2023-09-07 DIAGNOSIS — M549 Dorsalgia, unspecified: Secondary | ICD-10-CM | POA: Diagnosis not present

## 2023-09-07 LAB — POCT URINALYSIS DIPSTICK
Bilirubin, UA: NEGATIVE
Blood, UA: NEGATIVE
Glucose, UA: NEGATIVE
Ketones, UA: NEGATIVE
Leukocytes, UA: NEGATIVE
Nitrite, UA: NEGATIVE
Protein, UA: NEGATIVE
Spec Grav, UA: 1.015 (ref 1.010–1.025)
Urobilinogen, UA: 0.2 U/dL
pH, UA: 5 (ref 5.0–8.0)

## 2023-09-07 MED ORDER — IBUPROFEN 800 MG PO TABS: 800.0000 mg | ORAL_TABLET | Freq: Three times a day (TID) | ORAL | 5 refills | Status: AC | PRN

## 2023-09-07 MED ORDER — CYCLOBENZAPRINE HCL 10 MG PO TABS: 10.0000 mg | ORAL_TABLET | Freq: Three times a day (TID) | ORAL | 1 refills | Status: DC | PRN

## 2023-09-07 MED ORDER — OXYCODONE-ACETAMINOPHEN 5-325 MG PO TABS: 1.0000 | ORAL_TABLET | ORAL | 0 refills | Status: DC | PRN

## 2023-09-07 NOTE — Progress Notes (Signed)
Patient ID: Courtney [name], female   DOB: [birth year], 32 y.o.   MRN: [id number]  Chief Complaint  Patient presents with   Pelvic Pain    HPI Courtney [name] is a 32 y.o. female.  Complains of persistent left sided pelvic pain, manly with intercourse.  Denies constipation, diarrhea or dysuria.  Also c/o chronic backache.  She has seen a chiropractor, without relief.  HPI  Past Medical History:  Diagnosis Date   Back pain    Eczema    Gall stones     Past Surgical History:  Procedure Laterality Date   gallstones removed      Family History  Problem Relation Age of Onset   Diabetes Mother    Healthy Father     Social History Social History   Tobacco Use   Smoking status: Every Day    Current packs/day: 0.25    Average packs/day: 0.3 packs/day for 2.0 years (0.5 ttl pk-yrs)    Types: Cigarettes   Smokeless tobacco: Never   Tobacco comments:    8 per day   Vaping Use   Vaping status: Never Used  Substance Use Topics   Alcohol use: Not Currently    Comment: every 2-3 days   Drug use: No    No Known Allergies  Current Outpatient Medications  Medication Sig Dispense Refill   cyclobenzaprine (FLEXERIL) 10 MG tablet Take 1 tablet (10 mg total) by mouth every 8 (eight) hours as needed for muscle spasms. 30 tablet 1   ibuprofen (ADVIL) 800 MG tablet Take 1 tablet (800 mg total) by mouth every 8 (eight) hours as needed. 30 tablet 5   oxyCODONE-acetaminophen (PERCOCET/ROXICET) 5-325 MG tablet Take 1 tablet by mouth every 4 (four) hours as needed for severe pain (pain score 7-10). 20 tablet 0   hydrocortisone-pramoxine (PROCTOFOAM HC) rectal foam Place 1 applicator rectally 2 (two) times daily. (Patient not taking: Reported on 09/07/2023) 20 g 0   triamcinolone cream (KENALOG) 0.1 % Apply 1 Application topically 2 (two) times daily. (Patient not taking: Reported on 09/07/2023) 30 g 1   No current facility-administered medications for this visit.    Review of Systems Review  of Systems Constitutional: negative for fatigue and weight loss Respiratory: negative for cough and wheezing Cardiovascular: negative for chest pain, fatigue and palpitations Gastrointestinal: negative for abdominal pain and change in bowel habits Genitourinary: positive for left sided pelvic pain and chronic backache Integument/breast: negative for nipple discharge Musculoskeletal:negative for myalgias Neurological: negative for gait problems and tremors Behavioral/Psych: negative for abusive relationship, depression Endocrine: negative for temperature intolerance      Blood pressure 117/79, pulse 90, height 5\' 4"  (1.626 m), weight 152 lb 11.2 oz (69.3 kg).  Physical Exam Physical Exam General:   Alert and no distress  Skin:   no rash or abnormalities  Lungs:   clear to auscultation bilaterally  Heart:   regular rate and rhythm, S1, S2 normal, no murmur, click, rub or gallop  Breasts:   normal without suspicious masses, skin or nipple changes or axillary nodes  Abdomen:  normal findings: no organomegaly, soft, non-tender and no hernia  Pelvis:  External genitalia: normal general appearance Urinary system: urethral meatus normal and bladder without fullness, nontender Vaginal: normal without tenderness, induration or masses Cervix: normal appearance Adnexa: normal bimanual exam Uterus: anteverted and non-tender, normal size    I have spent a total of 20 minutes of face-to-face and non-face-to-face time, excluding clinical staff time, reviewing notes and preparing to see  patient, ordering tests and/or medications, and counseling the patient.   Data Reviewed Labs Wet prep and cultures  Assessment     1. Pelvic pain Rx: - US PELVIC COMPLETE WITH TRANSVAGINAL; Future - POCT Urinalysis Dipstick - oxyCODONE-acetaminophen (PERCOCET/ROXICET) 5-325 MG tablet; Take 1 tablet by mouth every 4 (four) hours as needed for severe pain (pain score 7-10).  Dispense: 20 tablet; Refill: 0  2.  Backache without radiation Rx: - POCT Urinalysis Dipstick - AMB referral to orthopedics - cyclobenzaprine (FLEXERIL) 10 MG tablet; Take 1 tablet (10 mg total) by mouth every 8 (eight) hours as needed for muscle spasms.  Dispense: 30 tablet; Refill: 1 - ibuprofen (ADVIL) 800 MG tablet; Take 1 tablet (800 mg total) by mouth every 8 (eight) hours as needed.  Dispense: 30 tablet; Refill: 5    Plan   Follow up virtually in 3 weeks  Orders Placed This Encounter  Procedures   US PELVIC COMPLETE WITH TRANSVAGINAL    Standing Status:   Future    Standing Expiration Date:   09/06/2024    Order Specific Question:   Reason for Exam (SYMPTOM  OR DIAGNOSIS REQUIRED)    Answer:   Pelvic pain    Order Specific Question:   Preferred imaging location?    Answer:   [facility] Ul[facility]ound   AMB referral to orthopedics    Referral Priority:   Routine    Referral Type:   Consultation    Number of Visits Requested:   1   POCT Urinalysis Dipstick   Meds ordered this encounter  Medications   oxyCODONE-acetaminophen (PERCOCET/ROXICET) 5-325 MG tablet    Sig: Take 1 tablet by mouth every 4 (four) hours as needed for severe pain (pain score 7-10).    Dispense:  20 tablet    Refill:  0   cyclobenzaprine (FLEXERIL) 10 MG tablet    Sig: Take 1 tablet (10 mg total) by mouth every 8 (eight) hours as needed for muscle spasms.    Dispense:  30 tablet    Refill:  1   ibuprofen (ADVIL) 800 MG tablet    Sig: Take 1 tablet (800 mg total) by mouth every 8 (eight) hours as needed.    Dispense:  30 tablet    Refill:  5   Brock Bad, MD, FACOG Attending Obstetrician & Gynecologist, [facility] for [facility], [facility], [place] 09/07/2023

## 2023-09-09 ENCOUNTER — Ambulatory Visit (HOSPITAL_BASED_OUTPATIENT_CLINIC_OR_DEPARTMENT_OTHER)
Admission: RE | Admit: 2023-09-09 | Discharge: 2023-09-09 | Disposition: A | Discharge status: Home / Self Care | Payer: Medicaid Other | Source: Ambulatory Visit | Attending: Obstetrics | Admitting: Obstetrics

## 2023-09-09 DIAGNOSIS — R102 Pelvic and perineal pain: Secondary | ICD-10-CM | POA: Insufficient documentation

## 2023-09-14 ENCOUNTER — Ambulatory Visit: Payer: Medicaid Other | Admitting: Physical Medicine and Rehabilitation

## 2023-09-14 ENCOUNTER — Telehealth: Payer: Self-pay | Admitting: Physical Medicine and Rehabilitation

## 2023-09-14 NOTE — Telephone Encounter (Signed)
Patient called to reschedule her appt for today at 3:00.ZO#[phone]

## 2023-09-20 ENCOUNTER — Ambulatory Visit: Payer: Medicaid Other | Admitting: Physical Medicine and Rehabilitation

## 2023-09-26 ENCOUNTER — Encounter: Payer: Self-pay | Admitting: Obstetrics

## 2023-09-26 ENCOUNTER — Telehealth (INDEPENDENT_AMBULATORY_CARE_PROVIDER_SITE_OTHER): Payer: Medicaid Other | Admitting: Obstetrics

## 2023-09-26 ENCOUNTER — Ambulatory Visit: Payer: Medicaid Other | Admitting: Obstetrics

## 2023-09-26 DIAGNOSIS — R102 Pelvic and perineal pain: Secondary | ICD-10-CM

## 2023-09-26 DIAGNOSIS — M549 Dorsalgia, unspecified: Secondary | ICD-10-CM

## 2023-09-26 DIAGNOSIS — L309 Dermatitis, unspecified: Secondary | ICD-10-CM

## 2023-09-26 NOTE — Progress Notes (Signed)
GYNECOLOGY VIRTUAL VISIT ENCOUNTER NOTE  Provider location: [facility] for [facility]   Patient location: Home  I connected with Courtney [name] on 09/26/23 at  1:30 PM EST by MyChart Video Encounter and verified that I am speaking with the correct person using two identifiers.   I discussed the limitations, risks, security and privacy concerns of performing an evaluation and management service virtually and the availability of in person appointments. I also discussed with the patient that there may be a patient responsible charge related to this service. The patient expressed understanding and agreed to proceed.   History:  Courtney [name] is a 32 y.o. [phone] female being evaluated today for Pelvic pain and backache.  She states that she no longer has pelvic pain, but still has lower backache.. Also c/o eczema.  She denies any abnormal vaginal discharge, bleeding, pelvic pain or other concerns.       Past Medical History:  Diagnosis Date   Back pain    Eczema    Gall stones    Past Surgical History:  Procedure Laterality Date   gallstones removed     The following portions of the patient's history were reviewed and updated as appropriate: allergies, current medications, past family history, past medical history, past social history, past surgical history and problem list.   Health Maintenance:  Normal pap and negative HRHPV on 12-30-2020.   Review of Systems:  Pertinent items noted in HPI and remainder of comprehensive ROS otherwise negative.  Physical Exam:   General:  Alert, oriented and cooperative. Patient appears to be in no acute distress.  Mental Status: Normal mood and affect. Normal behavior. Normal judgment and thought content.   Respiratory: Normal respiratory effort, no problems with respiration noted  Rest of physical exam deferred due to type of encounter  Labs and Imaging No results found for this or any previous visit (from the past 2  weeks). US PELVIC COMPLETE WITH TRANSVAGINAL Result Date: 09/09/2023 CLINICAL DATA:  Pelvic pain EXAM: ULTRASOUND OF PELVIS TECHNIQUE: Transabdominal and transvaginalultrasound examination of the pelvis was performed including evaluation of the uterus, ovaries, adnexal regions, and pelvic cul-de-sac. COMPARISON:  None Available. FINDINGS: Uterus9 x 6 x 5 cm. The endometrium 4 mm. The uterine cavity is empty. There are no uterine masses. Right ovary Unremarkable, 2.8 x 1.9 x 1.6 cm. Left ovary Unremarkable, 4.7 x 2.0 x 2.0 cm. Images of the adnexae demonstrated no masses or fluid collections. Color Doppler demonstrated ovarian blood flow. IMPRESSION: Unremarkable examination of the pelvis. Electronically Signed   By: Layla Maw M.D.   On: 09/09/2023 22:52       Assessment and Plan:     1. Pelvic pain (Primary) - resolved - ultrasound WNL's  2. Backache without radiation Rx: - AMB referral to orthopedics - continue NSAIDS  3. Eczema, unspecified type Rx: - Ambulatory referral to Dermatology     4. Follow up in 3 months   I discussed the assessment and treatment plan with the patient. The patient was provided an opportunity to ask questions and all were answered. The patient agreed with the plan and demonstrated an understanding of the instructions.   The patient was advised to call back or seek an in-person evaluation/go to the ED if the symptoms worsen or if the condition fails to improve as anticipated.  I have spent a total of 15 minutes of non-face-to-face time, excluding clinical staff time, reviewing notes and preparing to see patient, ordering tests and/or  medications, and counseling the patient.    Coral Ceo, MD Center for [facility], [facility], [facility] 09/26/2023

## 2023-09-26 NOTE — Progress Notes (Signed)
S/w for virtual visit. Pt states that she is currently still taking flexeril and pain medications with no relief. Pt is scheduled to discuss u/s results today.

## 2023-09-29 ENCOUNTER — Ambulatory Visit (INDEPENDENT_AMBULATORY_CARE_PROVIDER_SITE_OTHER): Payer: Medicaid Other | Admitting: Physical Medicine and Rehabilitation

## 2023-09-29 ENCOUNTER — Encounter: Payer: Self-pay | Admitting: Physical Medicine and Rehabilitation

## 2023-09-29 DIAGNOSIS — M546 Pain in thoracic spine: Secondary | ICD-10-CM | POA: Diagnosis not present

## 2023-09-29 DIAGNOSIS — G8929 Other chronic pain: Secondary | ICD-10-CM

## 2023-09-29 DIAGNOSIS — M7918 Myalgia, other site: Secondary | ICD-10-CM

## 2023-09-29 DIAGNOSIS — M25511 Pain in right shoulder: Secondary | ICD-10-CM

## 2023-09-29 DIAGNOSIS — M25512 Pain in left shoulder: Secondary | ICD-10-CM

## 2023-09-29 DIAGNOSIS — M542 Cervicalgia: Secondary | ICD-10-CM

## 2023-09-29 MED ORDER — METHOCARBAMOL 500 MG PO TABS: 500.0000 mg | ORAL_TABLET | Freq: Three times a day (TID) | ORAL | 0 refills | Status: DC

## 2023-09-29 MED ORDER — MELOXICAM 15 MG PO TABS: 15.0000 mg | ORAL_TABLET | Freq: Every day | ORAL | 0 refills | Status: DC

## 2023-09-29 NOTE — Progress Notes (Signed)
Courtney [name] - 32 y.o. female MRN [id number]  Date of birth: 1991/07/15  Office Visit Note: Visit Date: 09/29/2023 PCP: Brock Bad, MD Referred by: Brock Bad, MD  Subjective: Chief Complaint  Patient presents with   Middle Back - Pain   HPI: Courtney [name] is a 32 y.o. female who comes in today per the request of Dr. Coral Ceo for evaluation of chronic, worsening and severe left bilateral thoracic back pain radiating up her back to shoulders and neck. She was previously seen by Dr. Norlene Campbell. Pain ongoing for several years, started in 2021. 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 Her pain is constant, no specific aggravating factors. She describes pain as sore, aching and tight sensation, currently rates as 7 out of 10. She has tried formal physical therapy, dry needling, chiropractic treatments, home exercise regimen, and medications. She has tried Cymbalta in the past with minimal relief of pain. She is currently taking Oxycodone that is prescribed by her primary care provider Dr. Clearance Coots with minimal relief of pain. Thoracic MRI imaging from 2022 show well preserved disc spacing, no nerve impingement, no high grade spinal canal stenosis. Cervical MRI imaging from 2023 shows no significant stenosis. She was previously evaluated by Margie Ege, NP with [facility], these notes can be reviewed further in EPIC, referral to pain management was considered at that time. Patient denies focal weakness, numbness and tingling. No recent trauma or falls.     Review of Systems  Musculoskeletal:  Positive for back pain, myalgias and neck pain.  Neurological:  Negative for tingling, sensory change, focal weakness and weakness.  All other systems reviewed and are negative.  Otherwise per HPI.  Assessment & Plan: Visit Diagnoses:    ICD-10-CM   1. Chronic bilateral thoracic back pain  M54.6 Ambulatory  referral to Physical Therapy   G89.29     2. Chronic pain of both shoulders  M25.511 Ambulatory referral to Physical Therapy   G89.29    M25.512     3. Cervicalgia  M54.2 Ambulatory referral to Physical Therapy    4. Myofascial pain syndrome  M79.18 Ambulatory referral to Physical Therapy       Plan: Findings:  Chronic, worsening and severe bilateral thoracic back pain radiating up her back to shoulders and neck. Patient continues to have severe pain despite good conservative therapies such as formal physical therapy, dry needling, chiropractic treatments, home exercise regimen and medications. Patients clinical presentation and exam are consistent with myofascial pain syndrome. I also feel there could be an underlying type of central sensitization syndrome working to exacerbate her symptoms. Tenderness noted to bilateral thoracic paraspinal regions upon palpation today. We discussed treatment plan in detail today. MRI imaging of both thoracic and cervical spine are fairly normal for her age, would not recommend interventional procedures at this time. I do feel she would benefit from re-grouping with physical therapy for manual treatments and dry needling. We also discussed medication management, I prescribed Meloxicam and Robaxin for her to try. No red flag symptoms noted upon exam today.   Should her pain persist I encouraged patient to speak with her primary care provider regarding referral to chronic pain management. I do feel she would benefit from a more comprehensive pain management approach, would look at [facility].     Meds & Orders:  Meds ordered this encounter  Medications   meloxicam (MOBIC)  15 MG tablet    Sig: Take 1 tablet (15 mg total) by mouth daily.    Dispense:  30 tablet    Refill:  0   methocarbamol (ROBAXIN) 500 MG tablet    Sig: Take 1 tablet (500 mg total) by mouth 3 (three) times daily.    Dispense:  90 tablet    Refill:  0     Orders Placed This Encounter  Procedures   Ambulatory referral to Physical Therapy    Follow-up: Return if symptoms worsen or fail to improve.   Procedures: No procedures performed      Clinical History: No specialty comments available.   She reports that she has been smoking cigarettes. She has a 0.5 pack-year smoking history. She has never used smokeless tobacco. No results for input(s): "HGBA1C", "LABURIC" in the last 8760 hours.  Objective:  VS:  HT:    WT:   BMI:     BP:   HR: bpm  TEMP: ( )  RESP:  Physical Exam Vitals and nursing note reviewed.  HENT:     Head: Normocephalic and atraumatic.     Right Ear: External ear normal.     Left Ear: External ear normal.     Nose: Nose normal.     Mouth/Throat:     Mouth: Mucous membranes are moist.  Eyes:     Extraocular Movements: Extraocular movements intact.  Cardiovascular:     Rate and Rhythm: Normal rate.     Pulses: Normal pulses.  Pulmonary:     Effort: Pulmonary effort is normal.  Abdominal:     General: Abdomen is flat. There is no distension.  Musculoskeletal:        General: Tenderness present.     Cervical back: Normal range of motion.     Comments: Normal thoracic kyphosis noted. No tenderness noted upon palpation of spinous processes. No step off deformity. No pain noted with flexion, extension, lateral bending and rotation. Tenderness noted to bilateral thoracic paraspinal regions upon palpation. Good strength noted to bilateral upper extremities.    Skin:    General: Skin is warm and dry.     Capillary Refill: Capillary refill takes less than 2 seconds.  Neurological:     General: No focal deficit present.     Mental Status: She is alert and oriented to person, place, and time.  Psychiatric:        Mood and Affect: Mood normal.        Behavior: Behavior normal.     Ortho Exam  Imaging: No results found.  Past Medical/Family/Surgical/Social History: Medications & Allergies reviewed per EMR,  new medications updated. Patient Active Problem List   Diagnosis Date Noted   Body rash 08/09/2023   Cervicalgia 02/11/2022   Upper back pain 04/08/2020   Indication for care in labor or delivery 09/27/2013   Normal delivery 09/27/2013   Right orbit fracture (HCC) 08/10/2013   Eczema 04/17/2013   Mild hyperemesis gravidarum, antepartum 02/19/2013   General counseling for initiation of other contraceptive measures 01/02/2013   Anemia, iron deficiency 05/30/2012   Past Medical History:  Diagnosis Date   Back pain    Eczema    Gall stones    Family History  Problem Relation Age of Onset   Diabetes Mother    Healthy Father    Past Surgical History:  Procedure Laterality Date   gallstones removed     Social History   Occupational History   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

## 2023-12-20 ENCOUNTER — Ambulatory Visit: Payer: Medicaid Other | Attending: Physical Medicine and Rehabilitation

## 2023-12-20 ENCOUNTER — Other Ambulatory Visit: Payer: Self-pay

## 2023-12-20 DIAGNOSIS — M25512 Pain in left shoulder: Secondary | ICD-10-CM | POA: Diagnosis present

## 2023-12-20 DIAGNOSIS — M25511 Pain in right shoulder: Secondary | ICD-10-CM | POA: Diagnosis present

## 2023-12-20 DIAGNOSIS — M546 Pain in thoracic spine: Secondary | ICD-10-CM | POA: Diagnosis present

## 2023-12-20 DIAGNOSIS — M7918 Myalgia, other site: Secondary | ICD-10-CM | POA: Diagnosis not present

## 2023-12-20 DIAGNOSIS — G8929 Other chronic pain: Secondary | ICD-10-CM | POA: Insufficient documentation

## 2023-12-20 DIAGNOSIS — M542 Cervicalgia: Secondary | ICD-10-CM | POA: Insufficient documentation

## 2023-12-20 NOTE — Therapy (Signed)
 OUTPATIENT PHYSICAL THERAPY THORACOLUMBAR EVALUATION   Patient Name: Courtney [name] MRN: [id number] DOB:[birth year], 33 y.o., female Today's Date: 12/20/2023  END OF SESSION:   Past Medical History:  Diagnosis Date   Back pain    Eczema    Gall stones    Past Surgical History:  Procedure Laterality Date   gallstones removed     Patient Active Problem List   Diagnosis Date Noted   Body rash 08/09/2023   Cervicalgia 02/11/2022   Upper back pain 04/08/2020   Indication for care in labor or delivery 09/27/2013   Normal delivery 09/27/2013   Right orbit fracture (HCC) 08/10/2013   Eczema 04/17/2013   Mild hyperemesis gravidarum, antepartum 02/19/2013   General counseling for initiation of other contraceptive measures 01/02/2013   Anemia, iron deficiency 05/30/2012    PCP: ***  REFERRING PROVIDER: ***  REFERRING DIAG:  Chronic bilateral thoracic back pain [M54.6, G89.29], Chronic pain of both shoulders [M25.511, G89.29, M25.512], Cervicalgia [M54.2], Myofascial pain syndrome [M79.18]   Rationale for Evaluation and Treatment: Rehabilitation  THERAPY DIAG:  No diagnosis found.  ONSET DATE: ***  SUBJECTIVE:                                                                                                                                                                                           SUBJECTIVE STATEMENT: *** She reports that most of her pain is in her thoracic pain.   Right hand dominant   PERTINENT HISTORY:  ***  PAIN:  Are you having pain?  Yes: NPRS scale: 7-10/10 Pain location: R>L upper back  Pain description: throbbing, sharp, shooting, aching  Aggravating factors: laying down, driving >01 minutes, work (CNA), dusting, reaching overhead, heavy household tasks  Relieving factors: standing   PRECAUTIONS: None  RED FLAGS: None   WEIGHT BEARING RESTRICTIONS: No  FALLS:  Has patient fallen in last 6 months? No  LIVING ENVIRONMENT: Lives with:  lives with their family Lives in: House/apartment Stairs: Yes, a couple external stairs with railing  Has following equipment at home: None  OCCUPATION: CNA  PLOF: Independent  PATIENT GOALS: ***  NEXT MD VISIT: ***  OBJECTIVE:  Note: Objective measures were completed at Evaluation unless otherwise noted.  DIAGNOSTIC FINDINGS:  ***  PATIENT SURVEYS:  PSFS  COGNITION: Overall cognitive status: Within functional limits for tasks assessed     SENSATION: {sensation:27233}  MUSCLE LENGTH: Hamstrings: Right *** deg; Left *** deg Maisie Fus test: Right *** deg; Left *** deg  POSTURE: {posture:25561}  PALPATION: ***  LUMBAR ROM:   AROM eval  Flexion   Extension   Right lateral flexion   Left  lateral flexion   Right rotation   Left rotation    (Blank rows = not tested)  LOWER EXTREMITY ROM:     Active  Right eval Left eval  Hip flexion    Hip extension    Hip abduction    Hip adduction    Hip internal rotation    Hip external rotation    Knee flexion    Knee extension    Ankle dorsiflexion    Ankle plantarflexion    Ankle inversion    Ankle eversion     (Blank rows = not tested)  LOWER EXTREMITY MMT:    MMT Right eval Left eval  Hip flexion    Hip extension    Hip abduction    Hip adduction    Hip internal rotation    Hip external rotation    Knee flexion    Knee extension    Ankle dorsiflexion    Ankle plantarflexion    Ankle inversion    Ankle eversion     (Blank rows = not tested)  LUMBAR SPECIAL TESTS:  {lumbar special test:25242}  FUNCTIONAL TESTS:  {Functional tests:24029}  GAIT: Distance walked: 50 feet from lobby to treatment room  Assistive device utilized: {Assistive devices:23999} Level of assistance: {Levels of assistance:24026} Comments: ***   TREATMENT DATE:   [facility] Adult PT Treatment:                                                DATE: 12/20/2023   Initial evaluation: see patient education and home exercise  program as noted below                                                                                                                                   PATIENT EDUCATION:  Education details: reviewed initial home exercise program; discussion of POC, prognosis and goals for skilled PT   Person educated: Patient Education method: Explanation, Demonstration, and Handouts Education comprehension: verbalized understanding, returned demonstration, and needs further education  HOME EXERCISE PROGRAM: ***  ASSESSMENT:  CLINICAL IMPRESSION: Courtney [name] is a 33 y.o. female  who was seen today for physical therapy evaluation and treatment for ***. She is demonstrating ***. She has related pain and difficulty with ***. She requires skilled PT services at this time to address relevant deficits and improve overall function.     OBJECTIVE IMPAIRMENTS: {opptimpairments:25111}.   ACTIVITY LIMITATIONS: {activitylimitations:27494}  PARTICIPATION LIMITATIONS: {participationrestrictions:25113}  PERSONAL FACTORS: {Personal factors:25162} are also affecting patient's functional outcome.   REHAB POTENTIAL: {rehabpotential:25112}  CLINICAL DECISION MAKING: {clinical decision making:25114}  EVALUATION COMPLEXITY: {Evaluation complexity:25115}   GOALS: Goals reviewed with patient? Yes  SHORT TERM GOALS: Target date: ***  *** Baseline: Goal status: INITIAL  2.  *** Baseline:  Goal status: INITIAL  3.  ***  Baseline:  Goal status: INITIAL  4.  *** Baseline:  Goal status: INITIAL  5.  *** Baseline:  Goal status: INITIAL  6.  *** Baseline:  Goal status: INITIAL  LONG TERM GOALS: Target date: 01/31/2024   *** Baseline:  Goal status: INITIAL  2.  *** Baseline:  Goal status: INITIAL  3.  *** Baseline:  Goal status: INITIAL  4.  *** Baseline:  Goal status: INITIAL  5.  *** Baseline:  Goal status: INITIAL  6.  *** Baseline:  Goal status: INITIAL  PLAN:  PT FREQUENCY:  1-2x/week  PT DURATION: 8 weeks  PLANNED INTERVENTIONS: {rehab planned interventions:25118::"97110-Therapeutic exercises","97530- Therapeutic [phone]- Neuromuscular re-education","97535- Self AOZH","08657- Manual therapy"}.  PLAN FOR NEXT SESSION: *** consider manual therapy and TPDN   Mauri Reading, PT, DPT  12/20/2023 8:30 AM

## 2023-12-28 ENCOUNTER — Ambulatory Visit

## 2023-12-28 DIAGNOSIS — M542 Cervicalgia: Secondary | ICD-10-CM

## 2023-12-28 DIAGNOSIS — M546 Pain in thoracic spine: Secondary | ICD-10-CM

## 2023-12-28 DIAGNOSIS — G8929 Other chronic pain: Secondary | ICD-10-CM

## 2023-12-28 NOTE — Therapy (Signed)
 OUTPATIENT PHYSICAL THERAPY TREATMENT NOTE   Patient Name: Courtney [name] MRN: [id number] DOB:January 08, 1991, 33 y.o., female Today's Date: 12/28/2023  END OF SESSION:  PT End of Session - 12/28/23 1534     Visit Number 2    Number of Visits 16    Date for PT Re-Evaluation 01/31/24    Authorization Type [employer]    PT Start Time 1534    PT Stop Time 1612    PT Time Calculation (min) 38 min    Activity Tolerance Patient tolerated treatment well;Patient limited by pain    Behavior During Therapy [facility] for tasks assessed/performed             PT End of Session - 12/28/23 1534     Visit Number 2    Number of Visits 16    Date for PT Re-Evaluation 01/31/24    Authorization Type [employer]    PT Start Time 1534    PT Stop Time 1612    PT Time Calculation (min) 38 min    Activity Tolerance Patient tolerated treatment well;Patient limited by pain    Behavior During Therapy [facility] for tasks assessed/performed              Past Medical History:  Diagnosis Date   Back pain    Eczema    Gall stones    Past Surgical History:  Procedure Laterality Date   gallstones removed     Patient Active Problem List   Diagnosis Date Noted   Body rash 08/09/2023   Cervicalgia 02/11/2022   Upper back pain 04/08/2020   Indication for care in labor or delivery 09/27/2013   Normal delivery 09/27/2013   Right orbit fracture (HCC) 08/10/2013   Eczema 04/17/2013   Mild hyperemesis gravidarum, antepartum 02/19/2013   General counseling for initiation of other contraceptive measures 01/02/2013   Anemia, iron deficiency 05/30/2012    PCP:    Brock Bad, MD    REFERRING PROVIDER: Juanda Chance, NP   REFERRING DIAG:  Chronic bilateral thoracic back pain [M54.6, G89.29], Chronic pain of both shoulders [M25.511, G89.29, M25.512], Cervicalgia [M54.2], Myofascial pain syndrome [M79.18]   Rationale for Evaluation and Treatment: Rehabilitation  THERAPY DIAG:  Pain in thoracic  spine  Cervicalgia  Chronic pain of both shoulders  ONSET DATE: 09/29/2023   SUBJECTIVE:                                                                                                                                                                                           SUBJECTIVE STATEMENT: Patient reports that her pain continues, at a 6/10, and that  it is the worst at night when she is trying to go to bed.  EVAL: Patient reports to PT with history of upper back, neck and shoulder pain. However, she reports that most of her pain is in her upper back. She has been experiencing these pains for several years. 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   Right hand dominant   PERTINENT HISTORY:  Chronicity of symptoms   PAIN:  Are you having pain?  Yes: NPRS scale: 7-10/10 Pain location: R>L upper back  Pain description: throbbing, sharp, shooting, aching  Aggravating factors: laying down, driving >60 minutes, work (CNA), dusting, reaching overhead, heavy household tasks  Relieving factors: standing   PRECAUTIONS: None  RED FLAGS: None   WEIGHT BEARING RESTRICTIONS: No  FALLS:  Has patient fallen in last 6 months? No  LIVING ENVIRONMENT: Lives with: lives with their family Lives in: House/apartment Stairs: Yes, a couple external stairs with railing  Has following equipment at home: None  OCCUPATION: CNA  PLOF: Independent  PATIENT GOALS: To have less pain with daily and work activities  NEXT MD VISIT: not scheduled at time of eval   OBJECTIVE:  Note: Objective measures were completed at Evaluation unless otherwise noted.  DIAGNOSTIC FINDINGS:  03/23/2022 Cervical Spine MRI WO contrast IMPRESSION: No evidence of acute abnormality or significant stenosis.  PATIENT SURVEYS:  PSFS:  scale: 0 (unable to perform activity) - 10 (able to perform activity at the same level as before injury or problem)  Sleeping: 2 Driving: 0 "Movements"  (upper body): 5 Workout: 0 Sitting: 6  Total = 13 Avg = 2.6  COGNITION: Overall cognitive status: Within functional limits for tasks assessed     SENSATION: Not tested   POSTURE: rounded shoulders and forward head  THORACOLUMBAR ROM: grossly WFL in all directions. Pt reports increased pain with TS extension.   AROM eval  Flexion   Extension   Right lateral flexion   Left lateral flexion   Right rotation   Left rotation    (Blank rows = not tested)  GAIT: Distance walked: 50 feet from lobby to treatment room  Comments: no significant gait deviations noted at time of eval    TREATMENT DATE:  [facility] Adult PT Treatment:                                                DATE: 12/28/23 Therapeutic Exercise: Nustep level 5 x 5 mins Rows RTB 2x10 Shoulder extension RTB 2x10 Seated BIL ER with scap retraction RTB 2x10 POE x2' Prone press ups x5 Qped alternating UE/LE reach 2x5 BIL Childs pose x30" Seated pball roll outs fwd/lat x10 ea Bridges 2x10 LTR x10 BIL Sidelying open books x10 BIL   [facility] Adult PT Treatment:                                                DATE: 12/20/2023   Initial evaluation: see patient education and home exercise program as noted below  PATIENT EDUCATION:  Education details: reviewed initial home exercise program; discussion of POC, prognosis and goals for skilled PT   Person educated: Patient Education method: Explanation, Demonstration, and Handouts Education comprehension: verbalized understanding, returned demonstration, and needs further education  HOME EXERCISE PROGRAM: Access Code: [email or web address] URL: [email or web address] Date: 12/28/2023 Prepared by: Berta Minor  Exercises - Shoulder External Rotation and Scapular Retraction with Resistance  - 1 x daily - 7 x weekly - 2 sets - 10 reps -  Static Prone on Elbows  - 1 x daily - 7 x weekly - 2 min hold - Seated 3 Way Exercise Ball Roll Out Stretch  - 1 x daily - 7 x weekly - 10 reps  ASSESSMENT:  CLINICAL IMPRESSION: Patient presents to first follow up PT session reporting continued upper back pain that bothers her most at night when she is trying to get to sleep and when she is asleep. She is somewhat limited by pain throughout session, but it able to complete exercises. Session today focused on periscapular and core strengthening as well as thoracic mobility. She felt the most relief of her pain with seated pball roll outs. Created and reviewed HEP with patient. Patient continues to benefit from skilled PT services and should be progressed as able to improve functional independence.   EVAL: Tija is a 33 y.o. female  who was seen today for physical therapy evaluation and treatment for persistent Thoracic Pain with radiating symptoms to neck and BIL shoulders. She is demonstrating decreased postural endurance, pain with active TS extension, . She has related pain and difficulty with sleeping, overhead reaching and lifting, driving, exercises, prolonged sitting and performance of normal occupational duties as a Lawyer. She requires skilled PT services at this time to address relevant deficits and improve overall function.     OBJECTIVE IMPAIRMENTS: decreased activity tolerance, impaired UE functional use, postural dysfunction, and pain.   ACTIVITY LIMITATIONS: carrying, lifting, sitting, sleeping, reach over head, and caring for others  PARTICIPATION LIMITATIONS: meal prep, cleaning, laundry, driving, shopping, community activity, and occupation  PERSONAL FACTORS: Profession and Time since onset of injury/illness/exacerbation are also affecting patient's functional outcome.   REHAB POTENTIAL: Fair    CLINICAL DECISION MAKING: Stable/uncomplicated  EVALUATION COMPLEXITY: Low   GOALS: Goals reviewed with patient? Yes  SHORT TERM  GOALS: Target date: 01/18/2024   Patient will be independent with initial home program for thoracic mobility and UQ strengthening.  Baseline: provided at eval  Goal status: INITIAL  2.  Patient will demonstrate improved postural awareness for at least 15 minutes while seated without need for cueing from PT.   Baseline: see objective measures  Goal status: INITIAL   LONG TERM GOALS: Target date: 01/31/2024   Patient will report improved overall functional ability with PSFS average score of 5 or greater Baseline: 2.6 avg Goal status: INITIAL  2.  Patient will report ability to sit for at least 20 minutes, using thoracolumbar support as needed.  Baseline: 5 minutes  Goal status: INITIAL  3.  Patient will demonstrate ability to perform floor to waist lifting of at least 25# using appropriate body mechanics and with no more than minimal pain in order to safely perform normal daily/occupational tasks.   Baseline: moderate-to-severe pain/difficulty Goal status: INITIAL  4.  Patient will demonstrate ability to perform overhead lifting of at least 10# using appropriate body mechanics and with no more than minimal pain in order to safely perform normal daily/occupational tasks.   Baseline: moderate-to-severe pain/difficulty Goal status:  INITIAL    PLAN:  PT FREQUENCY: 1-2x/week  PT DURATION: 8 weeks  PLANNED INTERVENTIONS: 97164- PT Re-evaluation, 97110-Therapeutic exercises, 97530- Therapeutic activity, 97112- Neuromuscular re-education, 97535- Self Care, 25366- Manual therapy, [phone]- Aquatic Therapy, [phone]- Electrical stimulation (unattended), Patient/Family education, Taping, Dry Needling, Spinal manipulation, Spinal mobilization, Cryotherapy, and Moist heat.  PLAN FOR NEXT SESSION: create and provide initial HEP; assess CS AROM, trial repeated TS/LS extension vs flexion for pain centralization or relief. Periscapular strength/endurance, manual therapy including joint mobs as  indicated, aerobic activity for analgesic effects, consider TPDN if indicated.    Berta Minor PTA  12/28/2023 4:16 PM

## 2024-01-03 ENCOUNTER — Ambulatory Visit

## 2024-01-05 ENCOUNTER — Ambulatory Visit

## 2024-01-10 ENCOUNTER — Ambulatory Visit: Attending: Physical Medicine and Rehabilitation

## 2024-01-10 DIAGNOSIS — G8929 Other chronic pain: Secondary | ICD-10-CM | POA: Insufficient documentation

## 2024-01-10 DIAGNOSIS — M25511 Pain in right shoulder: Secondary | ICD-10-CM | POA: Diagnosis present

## 2024-01-10 DIAGNOSIS — M542 Cervicalgia: Secondary | ICD-10-CM | POA: Diagnosis present

## 2024-01-10 DIAGNOSIS — M6281 Muscle weakness (generalized): Secondary | ICD-10-CM | POA: Insufficient documentation

## 2024-01-10 DIAGNOSIS — M546 Pain in thoracic spine: Secondary | ICD-10-CM | POA: Insufficient documentation

## 2024-01-10 DIAGNOSIS — M25512 Pain in left shoulder: Secondary | ICD-10-CM | POA: Insufficient documentation

## 2024-01-10 DIAGNOSIS — R293 Abnormal posture: Secondary | ICD-10-CM | POA: Diagnosis present

## 2024-01-10 NOTE — Therapy (Signed)
 OUTPATIENT PHYSICAL THERAPY TREATMENT NOTE   Patient Name: Courtney [name] MRN: [id number] DOB:[birth year], 33 y.o., female Today's Date: 01/10/2024  END OF SESSION:  PT End of Session - 01/10/24 1531     Visit Number 3    Number of Visits 16    Date for PT Re-Evaluation 01/31/24    Authorization Type [employer]    PT Start Time 1530    PT Stop Time 1608    PT Time Calculation (min) 38 min    Activity Tolerance Patient tolerated treatment well;Patient limited by pain    Behavior During Therapy [facility] for tasks assessed/performed             Past Medical History:  Diagnosis Date   Back pain    Eczema    Gall stones    Past Surgical History:  Procedure Laterality Date   gallstones removed     Patient Active Problem List   Diagnosis Date Noted   Body rash 08/09/2023   Cervicalgia 02/11/2022   Upper back pain 04/08/2020   Indication for care in labor or delivery 09/27/2013   Normal delivery 09/27/2013   Right orbit fracture (HCC) 08/10/2013   Eczema 04/17/2013   Mild hyperemesis gravidarum, antepartum 02/19/2013   General counseling for initiation of other contraceptive measures 01/02/2013   Anemia, iron deficiency 05/30/2012    PCP:    Brock Bad, MD    REFERRING PROVIDER: Juanda Chance, NP   REFERRING DIAG:  Chronic bilateral thoracic back pain [M54.6, G89.29], Chronic pain of both shoulders [M25.511, G89.29, M25.512], Cervicalgia [M54.2], Myofascial pain syndrome [M79.18]   Rationale for Evaluation and Treatment: Rehabilitation  THERAPY DIAG:  Pain in thoracic spine  Cervicalgia  Chronic pain of both shoulders  Muscle weakness (generalized)  Abnormal posture  ONSET DATE: 09/29/2023   SUBJECTIVE:                                                                                                                                                                                           SUBJECTIVE STATEMENT: Patient reports that her pain is mostly  I her midback today, rates at an 8/10.  EVAL: Patient reports to PT with history of upper back, neck and shoulder pain. However, she reports that most of her pain is in her upper back. She has been experiencing these pains for several years. 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   Right hand dominant   PERTINENT HISTORY:  Chronicity of symptoms   PAIN:  Are you having pain?  Yes: NPRS scale: 7-10/10 Pain location: R>L upper back  Pain  description: throbbing, sharp, shooting, aching  Aggravating factors: laying down, driving >16 minutes, work (CNA), dusting, reaching overhead, heavy household tasks  Relieving factors: standing   PRECAUTIONS: None  RED FLAGS: None   WEIGHT BEARING RESTRICTIONS: No  FALLS:  Has patient fallen in last 6 months? No  LIVING ENVIRONMENT: Lives with: lives with their family Lives in: House/apartment Stairs: Yes, a couple external stairs with railing  Has following equipment at home: None  OCCUPATION: CNA  PLOF: Independent  PATIENT GOALS: To have less pain with daily and work activities  NEXT MD VISIT: not scheduled at time of eval   OBJECTIVE:  Note: Objective measures were completed at Evaluation unless otherwise noted.  DIAGNOSTIC FINDINGS:  03/23/2022 Cervical Spine MRI WO contrast IMPRESSION: No evidence of acute abnormality or significant stenosis.  PATIENT SURVEYS:  PSFS:  scale: 0 (unable to perform activity) - 10 (able to perform activity at the same level as before injury or problem)  Sleeping: 2 Driving: 0 "Movements" (upper body): 5 Workout: 0 Sitting: 6  Total = 13 Avg = 2.6  COGNITION: Overall cognitive status: Within functional limits for tasks assessed     SENSATION: Not tested   POSTURE: rounded shoulders and forward head  THORACOLUMBAR ROM: grossly WFL in all directions. Pt reports increased pain with TS extension.   AROM eval  Flexion   Extension   Right lateral flexion    Left lateral flexion   Right rotation   Left rotation    (Blank rows = not tested)  GAIT: Distance walked: 50 feet from lobby to treatment room  Comments: no significant gait deviations noted at time of eval    TREATMENT DATE:  [facility] Adult PT Treatment:                                                DATE: 01/10/24 Therapeutic Exercise: Nustep level 5 x 6 mins Rows RTB 2x10 Shoulder extension RTB 2x10 Seated BIL ER with scap retraction RTB 2x10 POE x2' Prone press ups x5 Seated pball roll outs fwd/lat x10 ea Bridges 2x10 LTR x10 BIL in abd x10 BIL Supine hip adduction ball squeeze 5" hold 2x10 Manual Therapy: Thoracic mob, pt on foam roller, grade V (performed by certified therapist Anne Ng, DPT)   [facility] Adult PT Treatment:                                                DATE: 12/28/23 Therapeutic Exercise: Nustep level 5 x 5 mins Rows RTB 2x10 Shoulder extension RTB 2x10 Seated BIL ER with scap retraction RTB 2x10 POE x2' Prone press ups x5 Qped alternating UE/LE reach 2x5 BIL Childs pose x30" Seated pball roll outs fwd/lat x10 ea Bridges 2x10 LTR x10 BIL Sidelying open books x10 BIL   [facility] Adult PT Treatment:                                                DATE: 12/20/2023   Initial evaluation: see patient education and home exercise program as noted below  PATIENT EDUCATION:  Education details: reviewed initial home exercise program; discussion of POC, prognosis and goals for skilled PT   Person educated: Patient Education method: Explanation, Demonstration, and Handouts Education comprehension: verbalized understanding, returned demonstration, and needs further education  HOME EXERCISE PROGRAM: Access Code: [email or web address] URL: [email or web address] Date: 12/28/2023 Prepared by: Berta Minor  Exercises - Shoulder  External Rotation and Scapular Retraction with Resistance  - 1 x daily - 7 x weekly - 2 sets - 10 reps - Static Prone on Elbows  - 1 x daily - 7 x weekly - 2 min hold - Seated 3 Way Exercise Ball Roll Out Stretch  - 1 x daily - 7 x weekly - 10 reps  ASSESSMENT:  CLINICAL IMPRESSION: Patient presents to PT reporting mid to upper back pain that is at an 8/10 today. She reports HEP compliance. Session today continued to focus on periscapular and core strengthening as well as thoracic mobility. She reports decreased tension with manual performed by Anne Ng. Patient continues to benefit from skilled PT services and should be progressed as able to improve functional independence.    EVAL: Courtney [name] is a 33 y.o. female  who was seen today for physical therapy evaluation and treatment for persistent Thoracic Pain with radiating symptoms to neck and BIL shoulders. She is demonstrating decreased postural endurance, pain with active TS extension, . She has related pain and difficulty with sleeping, overhead reaching and lifting, driving, exercises, prolonged sitting and performance of normal occupational duties as a Lawyer. She requires skilled PT services at this time to address relevant deficits and improve overall function.     OBJECTIVE IMPAIRMENTS: decreased activity tolerance, impaired UE functional use, postural dysfunction, and pain.   ACTIVITY LIMITATIONS: carrying, lifting, sitting, sleeping, reach over head, and caring for others  PARTICIPATION LIMITATIONS: meal prep, cleaning, laundry, driving, shopping, community activity, and occupation  PERSONAL FACTORS: Profession and Time since onset of injury/illness/exacerbation are also affecting patient's functional outcome.   REHAB POTENTIAL: Fair    CLINICAL DECISION MAKING: Stable/uncomplicated  EVALUATION COMPLEXITY: Low   GOALS: Goals reviewed with patient? Yes  SHORT TERM GOALS: Target date: 01/18/2024   Patient will be independent with  initial home program for thoracic mobility and UQ strengthening.  Baseline: provided at eval  Goal status: INITIAL  2.  Patient will demonstrate improved postural awareness for at least 15 minutes while seated without need for cueing from PT.   Baseline: see objective measures  Goal status: INITIAL   LONG TERM GOALS: Target date: 01/31/2024   Patient will report improved overall functional ability with PSFS average score of 5 or greater Baseline: 2.6 avg Goal status: INITIAL  2.  Patient will report ability to sit for at least 20 minutes, using thoracolumbar support as needed.  Baseline: 5 minutes  Goal status: INITIAL  3.  Patient will demonstrate ability to perform floor to waist lifting of at least 25# using appropriate body mechanics and with no more than minimal pain in order to safely perform normal daily/occupational tasks.   Baseline: moderate-to-severe pain/difficulty Goal status: INITIAL  4.  Patient will demonstrate ability to perform overhead lifting of at least 10# using appropriate body mechanics and with no more than minimal pain in order to safely perform normal daily/occupational tasks.   Baseline: moderate-to-severe pain/difficulty Goal status: INITIAL    PLAN:  PT FREQUENCY: 1-2x/week  PT DURATION: 8 weeks  PLANNED INTERVENTIONS: 97164- PT Re-evaluation, 97110-Therapeutic exercises, 97530- Therapeutic activity, [id number]- Neuromuscular re-education, 97535- Self Care, 16109-  Manual therapy, [id number]- Aquatic Therapy, [phone]- Electrical stimulation (unattended), Patient/Family education, Taping, Dry Needling, Spinal manipulation, Spinal mobilization, Cryotherapy, and Moist heat.  PLAN FOR NEXT SESSION: create and provide initial HEP; assess CS AROM, trial repeated TS/LS extension vs flexion for pain centralization or relief. Periscapular strength/endurance, manual therapy including joint mobs as indicated, aerobic activity for analgesic effects, consider TPDN if  indicated.    Berta Minor PTA  01/10/2024 4:09 PM

## 2024-01-12 ENCOUNTER — Ambulatory Visit

## 2024-01-17 ENCOUNTER — Ambulatory Visit: Admitting: Physical Therapy

## 2024-01-19 ENCOUNTER — Ambulatory Visit

## 2024-01-19 DIAGNOSIS — M546 Pain in thoracic spine: Secondary | ICD-10-CM

## 2024-01-19 DIAGNOSIS — G8929 Other chronic pain: Secondary | ICD-10-CM

## 2024-01-19 DIAGNOSIS — M542 Cervicalgia: Secondary | ICD-10-CM

## 2024-01-19 NOTE — Therapy (Signed)
 OUTPATIENT PHYSICAL THERAPY TREATMENT NOTE   Patient Name: Courtney [name] MRN: [id number] DOB:[birth year], 33 y.o., female Today's Date: 01/19/2024  END OF SESSION:    Past Medical History:  Diagnosis Date   Back pain    Eczema    Gall stones    Past Surgical History:  Procedure Laterality Date   gallstones removed     Patient Active Problem List   Diagnosis Date Noted   Body rash 08/09/2023   Cervicalgia 02/11/2022   Upper back pain 04/08/2020   Indication for care in labor or delivery 09/27/2013   Normal delivery 09/27/2013   Right orbit fracture (HCC) 08/10/2013   Eczema 04/17/2013   Mild hyperemesis gravidarum, antepartum 02/19/2013   General counseling for initiation of other contraceptive measures 01/02/2013   Anemia, iron deficiency 05/30/2012    PCP:    Brock Bad, MD    REFERRING PROVIDER: Juanda Chance, NP   REFERRING DIAG:  Chronic bilateral thoracic back pain [M54.6, G89.29], Chronic pain of both shoulders [M25.511, G89.29, M25.512], Cervicalgia [M54.2], Myofascial pain syndrome [M79.18]   Rationale for Evaluation and Treatment: Rehabilitation  THERAPY DIAG:  No diagnosis found.  ONSET DATE: 09/29/2023   SUBJECTIVE:                                                                                                                                                                                           SUBJECTIVE STATEMENT: Patient reports that her pain is mostly I her midback today, rates at an 8/10.  EVAL: Patient reports to PT with history of upper back, neck and shoulder pain. However, she reports that most of her pain is in her upper back. She has been experiencing these pains for several years. 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   Right hand dominant   PERTINENT HISTORY:  Chronicity of symptoms   PAIN:  Are you having pain?  Yes: NPRS scale: 7-10/10 Pain location: R>L upper back  Pain  description: throbbing, sharp, shooting, aching  Aggravating factors: laying down, driving >40 minutes, work (CNA), dusting, reaching overhead, heavy household tasks  Relieving factors: standing   PRECAUTIONS: None  RED FLAGS: None   WEIGHT BEARING RESTRICTIONS: No  FALLS:  Has patient fallen in last 6 months? No  LIVING ENVIRONMENT: Lives with: lives with their family Lives in: House/apartment Stairs: Yes, a couple external stairs with railing  Has following equipment at home: None  OCCUPATION: CNA  PLOF: Independent  PATIENT GOALS: To have less pain with daily and work activities  NEXT MD VISIT: not scheduled at time of eval  OBJECTIVE:  Note: Objective measures were completed at Evaluation unless otherwise noted.  DIAGNOSTIC FINDINGS:  03/23/2022 Cervical Spine MRI WO contrast IMPRESSION: No evidence of acute abnormality or significant stenosis.  PATIENT SURVEYS:  PSFS:  scale: 0 (unable to perform activity) - 10 (able to perform activity at the same level as before injury or problem)  Sleeping: 2 Driving: 0 "Movements" (upper body): 5 Workout: 0 Sitting: 6  Total = 13 Avg = 2.6  COGNITION: Overall cognitive status: Within functional limits for tasks assessed     SENSATION: Not tested   POSTURE: rounded shoulders and forward head  THORACOLUMBAR ROM: grossly WFL in all directions. Pt reports increased pain with TS extension.   AROM eval  Flexion   Extension   Right lateral flexion   Left lateral flexion   Right rotation   Left rotation    (Blank rows = not tested)  GAIT: Distance walked: 50 feet from lobby to treatment room  Comments: no significant gait deviations noted at time of eval    TREATMENT DATE:   [facility] Adult PT Treatment:                                                DATE: 01/19/2024  Therapeutic Exercise: Nustep level 2 x 5 mins PRONE ROWS, ISOMETRIC 5 SEC HOLD 2 X 5 EACH SIDE  Prone lower trap raises 5 sec hold, 2x5 each   Supine DKTC with pball  Seated   Rows RTB 2x10 Shoulder extension RTB 2x10 Seated BIL ER with scap retraction RTB 2x10 POE x2' Prone press ups x5 Seated pball roll outs fwd/lat x10 ea Bridges 2x10 LTR x10 BIL in abd x10 BIL Supine hip adduction ball squeeze 5" hold 2x10   [facility] Adult PT Treatment:                                                DATE: 01/10/24 Therapeutic Exercise: Nustep level 5 x 6 mins Rows RTB 2x10 Shoulder extension RTB 2x10 Seated BIL ER with scap retraction RTB 2x10 POE x2' Prone press ups x5 Seated pball roll outs fwd/lat x10 ea Bridges 2x10 LTR x10 BIL in abd x10 BIL Supine hip adduction ball squeeze 5" hold 2x10  Manual Therapy: Thoracic mob, pt on foam roller, grade V (performed by certified therapist Anne Ng, DPT)   [facility] Adult PT Treatment:                                                DATE: 12/28/23 Therapeutic Exercise: Nustep level 5 x 5 mins Rows RTB 2x10 Shoulder extension RTB 2x10 Seated BIL ER with scap retraction RTB 2x10 POE x2' Prone press ups x5 Qped alternating UE/LE reach 2x5 BIL Childs pose x30" Seated pball roll outs fwd/lat x10 ea Bridges 2x10 LTR x10 BIL Sidelying open books x10 BIL   [facility] Adult PT Treatment:  DATE: 12/20/2023   Initial evaluation: see patient education and home exercise program as noted below                                                                                                                               PATIENT EDUCATION:  Education details: reviewed initial home exercise program; discussion of POC, prognosis and goals for skilled PT   Person educated: Patient Education method: Explanation, Demonstration, and Handouts Education comprehension: verbalized understanding, returned demonstration, and needs further education  HOME EXERCISE PROGRAM: Access Code: [email or web address] URL: [email or web address] Date: 12/28/2023 Prepared  by: Berta Minor  Exercises - Shoulder External Rotation and Scapular Retraction with Resistance  - 1 x daily - 7 x weekly - 2 sets - 10 reps - Static Prone on Elbows  - 1 x daily - 7 x weekly - 2 min hold - Seated 3 Way Exercise Ball Roll Out Stretch  - 1 x daily - 7 x weekly - 10 reps  ASSESSMENT:  CLINICAL IMPRESSION: Patient presents to PT reporting mid to upper back pain that is at an 8/10 today. She reports HEP compliance. Session today continued to focus on periscapular and core strengthening as well as thoracic mobility. She reports decreased tension with manual performed by Anne Ng. Patient continues to benefit from skilled PT services and should be progressed as able to improve functional independence.    EVAL: Courtney [name] is a 33 y.o. female  who was seen today for physical therapy evaluation and treatment for persistent Thoracic Pain with radiating symptoms to neck and BIL shoulders. She is demonstrating decreased postural endurance, pain with active TS extension, . She has related pain and difficulty with sleeping, overhead reaching and lifting, driving, exercises, prolonged sitting and performance of normal occupational duties as a Lawyer. She requires skilled PT services at this time to address relevant deficits and improve overall function.     OBJECTIVE IMPAIRMENTS: decreased activity tolerance, impaired UE functional use, postural dysfunction, and pain.   ACTIVITY LIMITATIONS: carrying, lifting, sitting, sleeping, reach over head, and caring for others  PARTICIPATION LIMITATIONS: meal prep, cleaning, laundry, driving, shopping, community activity, and occupation  PERSONAL FACTORS: Profession and Time since onset of injury/illness/exacerbation are also affecting patient's functional outcome.   REHAB POTENTIAL: Fair    CLINICAL DECISION MAKING: Stable/uncomplicated  EVALUATION COMPLEXITY: Low   GOALS: Goals reviewed with patient? Yes  SHORT TERM GOALS: Target date:  01/18/2024   Patient will be independent with initial home program for thoracic mobility and UQ strengthening.  Baseline: provided at eval  Goal status: INITIAL  2.  Patient will demonstrate improved postural awareness for at least 15 minutes while seated without need for cueing from PT.   Baseline: see objective measures  Goal status: INITIAL   LONG TERM GOALS: Target date: 01/31/2024   Patient will report improved overall functional ability with PSFS average score of 5 or greater Baseline: 2.6 avg Goal status: INITIAL  2.  Patient will report ability to sit for at least 20 minutes, using thoracolumbar support as needed.  Baseline: 5 minutes  Goal status: INITIAL  3.  Patient will demonstrate ability to perform floor to waist lifting of at least 25# using appropriate body mechanics and with no more than minimal pain in order to safely perform normal daily/occupational tasks.   Baseline: moderate-to-severe pain/difficulty Goal status: INITIAL  4.  Patient will demonstrate ability to perform overhead lifting of at least 10# using appropriate body mechanics and with no more than minimal pain in order to safely perform normal daily/occupational tasks.   Baseline: moderate-to-severe pain/difficulty Goal status: INITIAL    PLAN:  PT FREQUENCY: 1-2x/week  PT DURATION: 8 weeks  PLANNED INTERVENTIONS: 97164- PT Re-evaluation, 97110-Therapeutic exercises, 97530- Therapeutic activity, 97112- Neuromuscular re-education, 97535- Self Care, 62130- Manual therapy, [id number]- Aquatic Therapy, [id number]- Electrical stimulation (unattended), Patient/Family education, Taping, Dry Needling, Spinal manipulation, Spinal mobilization, Cryotherapy, and Moist heat.  PLAN FOR NEXT SESSION: create and provide initial HEP; assess CS AROM, trial repeated TS/LS extension vs flexion for pain centralization or relief. Periscapular strength/endurance, manual therapy including joint mobs as indicated, aerobic activity  for analgesic effects, consider TPDN if indicated.    Mauri Reading PT  01/19/2024 3:28 PM

## 2024-02-01 ENCOUNTER — Encounter: Payer: Self-pay | Admitting: Physical Therapy

## 2024-02-01 ENCOUNTER — Ambulatory Visit: Payer: Self-pay | Admitting: Physical Therapy

## 2024-02-01 DIAGNOSIS — M542 Cervicalgia: Secondary | ICD-10-CM

## 2024-02-01 DIAGNOSIS — G8929 Other chronic pain: Secondary | ICD-10-CM

## 2024-02-01 DIAGNOSIS — M546 Pain in thoracic spine: Secondary | ICD-10-CM | POA: Diagnosis not present

## 2024-02-01 DIAGNOSIS — M6281 Muscle weakness (generalized): Secondary | ICD-10-CM

## 2024-02-01 NOTE — Therapy (Signed)
 PHYSICAL THERAPY DISCHARGE SUMMARY  Visits from Start of Care: 5  Current functional level related to goals / functional outcomes: See assessment/goals   Remaining deficits: See assessment/goals   Education / Equipment: HEP and D/C plans  Patient agrees to discharge. Patient goals were not met. Patient is being discharged due to lack of progress.   Patient Name: Courtney [name] MRN: [id number] DOB:[birth year], 33 y.o., female Today's Date: 02/01/2024  END OF SESSION:  PT End of Session - 02/01/24 1334     Visit Number 5    Number of Visits 16    Date for PT Re-Evaluation 01/31/24    Authorization Type [employer]    PT Start Time 1333    PT Stop Time 1415    PT Time Calculation (min) 42 min               Past Medical History:  Diagnosis Date   Back pain    Eczema    Gall stones    Past Surgical History:  Procedure Laterality Date   gallstones removed     Patient Active Problem List   Diagnosis Date Noted   Body rash 08/09/2023   Cervicalgia 02/11/2022   Upper back pain 04/08/2020   Indication for care in labor or delivery 09/27/2013   Normal delivery 09/27/2013   Right orbit fracture (HCC) 08/10/2013   Eczema 04/17/2013   Mild hyperemesis gravidarum, antepartum 02/19/2013   General counseling for initiation of other contraceptive measures 01/02/2013   Anemia, iron deficiency 05/30/2012    PCP:    Gabrielle Joiner, MD    REFERRING PROVIDER: Darryll Eng, NP   REFERRING DIAG:  Chronic bilateral thoracic back pain [M54.6, G89.29], Chronic pain of both shoulders [M25.511, G89.29, M25.512], Cervicalgia [M54.2], Myofascial pain syndrome [M79.18]   Rationale for Evaluation and Treatment: Rehabilitation  THERAPY DIAG:  Pain in thoracic spine  Cervicalgia  Chronic pain of both shoulders  Muscle weakness (generalized)  ONSET DATE: 09/29/2023   SUBJECTIVE:                                                                                                                                                                                            SUBJECTIVE STATEMENT: 02/01/2024: Pt reports that she continues to have significant pain and has made little progress so far.  EVAL: Patient reports to PT with history of upper back, neck and shoulder pain. However, she reports that most of her pain is in her upper back. She has been experiencing these pains for several years. 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  Right hand dominant   PERTINENT HISTORY:  Chronicity of symptoms   PAIN:  Are you having pain?  Yes: NPRS scale: 7-10/10 Pain location: R>L upper back  Pain description: throbbing, sharp, shooting, aching  Aggravating factors: laying down, driving >16 minutes, work (CNA), dusting, reaching overhead, heavy household tasks  Relieving factors: standing   PRECAUTIONS: None  RED FLAGS: None   WEIGHT BEARING RESTRICTIONS: No  FALLS:  Has patient fallen in last 6 months? No  LIVING ENVIRONMENT: Lives with: lives with their family Lives in: House/apartment Stairs: Yes, a couple external stairs with railing  Has following equipment at home: None  OCCUPATION: CNA  PLOF: Independent  PATIENT GOALS: To have less pain with daily and work activities  NEXT MD VISIT: not scheduled at time of eval   OBJECTIVE:  Note: Objective measures were completed at Evaluation unless otherwise noted.  DIAGNOSTIC FINDINGS:  03/23/2022 Cervical Spine MRI WO contrast IMPRESSION: No evidence of acute abnormality or significant stenosis.  PATIENT SURVEYS:  PSFS:  scale: 0 (unable to perform activity) - 10 (able to perform activity at the same level as before injury or problem)  Sleeping: 2 Driving: 0 "Movements" (upper body): 5 Workout: 0 Sitting: 6  Total = 13 Avg = 2.6  COGNITION: Overall cognitive status: Within functional limits for tasks assessed     SENSATION: Not tested   POSTURE: rounded  shoulders and forward head  THORACOLUMBAR ROM: grossly WFL in all directions. Pt reports increased pain with TS extension.   AROM eval  Flexion   Extension   Right lateral flexion   Left lateral flexion   Right rotation   Left rotation    (Blank rows = not tested)  GAIT: Distance walked: 50 feet from lobby to treatment room  Comments: no significant gait deviations noted at time of eval    TREATMENT DATE:   [facility] Adult PT Treatment:                                                DATE: 02/01/2024  Therapeutic Exercise: Nustep level 5 x 5 mins Open book  Therapeutic Activity  collecting information for goals, checking progress, and reviewing with patient D/C planning and next steps Discussing chronic pain and nociplastic pain principles   Manual Therapy UPA and CPA Tx spine G II - IV  [facility] Adult PT Treatment:                                                DATE: 01/19/2024  Therapeutic Exercise: Nustep level 2 x 5 mins Prone rows, ISOMETRIC 5 SEC HOLD 2 X 5 EACH SIDE, with minimal clinician resistance  Prone lower trap raises 5 sec hold, 2x5 each with minimal clinician resistance  Supine DKTC with pball, 2 x 10  Seated pball rolling, increased pain  Seated TS extension, increased pain   Therapeutic Activity:  Patient education regarding updated POC (considering TPDN trial, increased focus on isometric/neutral spine mm strengthening for upper back and core musculature, use of modalities and manual therapy as needed for pain modulation)  [facility] Adult PT Treatment:  DATE: 01/10/24 Therapeutic Exercise: Nustep level 5 x 6 mins Rows RTB 2x10 Shoulder extension RTB 2x10 Seated BIL ER with scap retraction RTB 2x10 POE x2' Prone press ups x5 Seated pball roll outs fwd/lat x10 ea Bridges 2x10 LTR x10 BIL in abd x10 BIL Supine hip adduction ball squeeze 5" hold 2x10  Manual Therapy: Thoracic mob, pt on foam roller, grade V  (performed by certified therapist Hartley Line, DPT)   [facility] Adult PT Treatment:                                                DATE: 12/28/23 Therapeutic Exercise: Nustep level 5 x 5 mins Rows RTB 2x10 Shoulder extension RTB 2x10 Seated BIL ER with scap retraction RTB 2x10 POE x2' Prone press ups x5 Qped alternating UE/LE reach 2x5 BIL Childs pose x30" Seated pball roll outs fwd/lat x10 ea Bridges 2x10 LTR x10 BIL Sidelying open books x10 BIL   [facility] Adult PT Treatment:                                                DATE: 12/20/2023   Initial evaluation: see patient education and home exercise program as noted below                                                                                                                               PATIENT EDUCATION:  Education details: reviewed initial home exercise program; discussion of POC, prognosis and goals for skilled PT   Person educated: Patient Education method: Explanation, Demonstration, and Handouts Education comprehension: verbalized understanding, returned demonstration, and needs further education  HOME EXERCISE PROGRAM: Access Code: [email or web address] URL: [email or web address] Date: 12/28/2023 Prepared by: Anna Kettering  Exercises - Shoulder External Rotation and Scapular Retraction with Resistance  - 1 x daily - 7 x weekly - 2 sets - 10 reps - Static Prone on Elbows  - 1 x daily - 7 x weekly - 2 min hold - Seated 3 Way Exercise Ball Roll Out Stretch  - 1 x daily - 7 x weekly - 10 reps  ASSESSMENT:  CLINICAL IMPRESSION:  Jaira continues to show poor response to exercise.  Gentle movement increases her pain as well as even light strengthening.  She reports she has tried PT multiple times with no improvement.  Upon goal recheck she is able to meet her functional lifting goals but has significant increases in pain with these.  She does also show a significant improvement on her PSFS.  Despite this, she  reports she has seen no progress in regards to her pain or function during daily activities.  She would like to D/C today and pursue other  options which I think is reasonable.  She may consider referral to see pain management.   EVAL: Reesa is a 33 y.o. female  who was seen today for physical therapy evaluation and treatment for persistent Thoracic Pain with radiating symptoms to neck and BIL shoulders. She is demonstrating decreased postural endurance, pain with active TS extension, . She has related pain and difficulty with sleeping, overhead reaching and lifting, driving, exercises, prolonged sitting and performance of normal occupational duties as a Lawyer. She requires skilled PT services at this time to address relevant deficits and improve overall function.     OBJECTIVE IMPAIRMENTS: decreased activity tolerance, impaired UE functional use, postural dysfunction, and pain.   ACTIVITY LIMITATIONS: carrying, lifting, sitting, sleeping, reach over head, and caring for others  PARTICIPATION LIMITATIONS: meal prep, cleaning, laundry, driving, shopping, community activity, and occupation  PERSONAL FACTORS: Profession and Time since onset of injury/illness/exacerbation are also affecting patient's functional outcome.   REHAB POTENTIAL: Fair    CLINICAL DECISION MAKING: Stable/uncomplicated  EVALUATION COMPLEXITY: Low   GOALS: Goals reviewed with patient? Yes  SHORT TERM GOALS: Target date: 01/18/2024   Patient will be independent with initial home program for thoracic mobility and UQ strengthening.  Baseline: provided at eval  Goal status: MET  2.  Patient will demonstrate improved postural awareness for at least 15 minutes while seated without need for cueing from PT.   Baseline: see objective measures  Goal status: MET   LONG TERM GOALS: Target date: 01/31/2024   Patient will report improved overall functional ability with PSFS average score of 5 or greater Baseline: 2.6  avg  PSFS:  scale: 0 (unable to perform activity) - 10 (able to perform activity at the same level as before injury or problem)   Sleeping: 4 Driving: 6 "Movements" (upper body): 5 Workout: 6 Sitting: 4  Avg = 5  Goal status: MET  2.  Patient will report ability to sit for at least 20 minutes, using thoracolumbar support as needed.  Baseline: 5 minutes  4/23: able with pain Goal status: partially MET  3.  Patient will demonstrate ability to perform floor to waist lifting of at least 25# using appropriate body mechanics and with no more than minimal pain in order to safely perform normal daily/occupational tasks.   Baseline: moderate-to-severe pain/difficulty 4/23: 7/10 pain but low difficulty Goal status: partially met  4.  Patient will demonstrate ability to perform overhead lifting of at least 10# using appropriate body mechanics and with no more than minimal pain in order to safely perform normal daily/occupational tasks.   Baseline: moderate-to-severe pain/difficulty 4/23: able but painful Goal status: partially MET    PLAN:  PT FREQUENCY: 1-2x/week  PT DURATION: 8 weeks  PLANNED INTERVENTIONS: 97164- PT Re-evaluation, 97110-Therapeutic exercises, 97530- Therapeutic activity, 97112- Neuromuscular re-education, 97535- Self Care, 16109- Manual therapy, [phone]- Aquatic Therapy, [id number]- Electrical stimulation (unattended), Patient/Family education, Taping, Dry Needling, Spinal manipulation, Spinal mobilization, Cryotherapy, and Moist heat.  PLAN FOR NEXT SESSION: updated HEP;  considering TPDN trial, increased focus on isometric/neutral spine mm strengthening for upper back and core musculature, use of modalities and manual therapy as needed for pain modulation;  Donovan Gallant Uriyah Raska PT 02/01/2024 2:15 PM

## 2024-06-22 ENCOUNTER — Other Ambulatory Visit: Payer: Self-pay

## 2024-06-22 ENCOUNTER — Ambulatory Visit
Admission: EM | Admit: 2024-06-22 | Discharge: 2024-06-22 | Disposition: A | Discharge status: Home / Self Care | Attending: Physician Assistant | Admitting: Physician Assistant

## 2024-06-22 ENCOUNTER — Ambulatory Visit (INDEPENDENT_AMBULATORY_CARE_PROVIDER_SITE_OTHER)

## 2024-06-22 DIAGNOSIS — M546 Pain in thoracic spine: Secondary | ICD-10-CM

## 2024-06-22 DIAGNOSIS — S161XXA Strain of muscle, fascia and tendon at neck level, initial encounter: Secondary | ICD-10-CM

## 2024-06-22 DIAGNOSIS — M542 Cervicalgia: Secondary | ICD-10-CM

## 2024-06-22 MED ORDER — NAPROXEN 375 MG PO TABS: 375.0000 mg | ORAL_TABLET | Freq: Two times a day (BID) | ORAL | 0 refills | Status: AC

## 2024-06-22 MED ORDER — METHOCARBAMOL 500 MG PO TABS: 500.0000 mg | ORAL_TABLET | Freq: Two times a day (BID) | ORAL | 0 refills | Status: AC
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
## 2024-06-22 NOTE — Discharge Instructions (Signed)
 Your x-ray was normal with no evidence of any broken bones in your neck.  I believe that you have injured the muscles in your back.  Please start Naprosyn  twice a day.  Do not take other NSAIDs with this medication including aspirin, ibuprofen /Advil , naproxen /Aleve .  You can use acetaminophen /Tylenol  for breakthrough pain.  Take Robaxin  up to 3 times a day.  This will make you sleepy so do not drive or drink alcohol taking it. I recommend you follow-up with sports medicine; call to schedule an appointment.  If anything worsens and you have severe pain, going to the bathroom on yourself without noticing it, numbness or tingling in your legs, weakness in your legs you need to be seen immediately.
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
## 2024-07-11 ENCOUNTER — Ambulatory Visit: Admitting: Dermatology

## 2024-07-11 ENCOUNTER — Encounter: Payer: Self-pay | Admitting: Dermatology

## 2024-07-11 VITALS — BP 105/70 | HR 88

## 2024-07-11 DIAGNOSIS — L309 Dermatitis, unspecified: Secondary | ICD-10-CM

## 2024-07-11 DIAGNOSIS — B360 Pityriasis versicolor: Secondary | ICD-10-CM | POA: Diagnosis not present

## 2024-07-11 MED ORDER — KETOCONAZOLE 2 % EX CREA: 1.0000 | TOPICAL_CREAM | Freq: Two times a day (BID) | CUTANEOUS | 9 refills | Status: AC

## 2024-07-11 MED ORDER — FLUCONAZOLE 150 MG PO TABS: 150.0000 mg | ORAL_TABLET | Freq: Every day | ORAL | 9 refills | Status: AC

## 2024-07-11 MED ORDER — TRIAMCINOLONE ACETONIDE 0.1 % EX CREA: 1.0000 | TOPICAL_CREAM | Freq: Two times a day (BID) | CUTANEOUS | 9 refills | Status: AC

## 2024-07-11 NOTE — Progress Notes (Signed)
   New Patient Visit   Subjective  Courtney [name] is a 33 y.o. female who presents for the following: rash on upper chest, back, hands and arms  Patient states she has rash located at the chest, back, bilateral arms and bilateral hands that he  would like to have examined. Patient reports the areas have been there for 1 year. She reports the areas are bothersome. Patient rates irritation 7 out of 10. She states that the areas have spread. Patient reports she has not previously been treated for these areas. Patient reports that she has used aquaphor on spots and it did not help. Patient reports that she tried ketoconazole and that it did help. Patient reports that all spots itch and that inflamation and irritation does not go away. Patient reports that clothing material does not affect it. Patient reports that it does seem to itch more while out in the sun. Patient denies Hx of bx. Patient denies family history of skin cancer(s). Patient is not pregnant, trying to conceive or nursing.  The following portions of the chart were reviewed this encounter and updated as appropriate: medications, allergies, medical history  Review of Systems:  No other skin or systemic complaints except as noted in HPI or Assessment and Plan.  Objective  Well appearing patient in no apparent distress; mood and affect are within normal limits.  A focused examination was performed of the following areas: Upper chest, back and arms  Relevant exam findings are noted in the Assessment and Plan.                    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 Caused by an overgrowth of yeast on the skin, exacerbated by increased oil production and sweating. Not contagious, characterized by white scaly patches. Previous treatment with ketoconazole shampoo was effective but the condition  recurred. - Prescribe ketoconazole cream to be applied twice daily, morning and night, or at least once daily until the rash resolves. - Recommend mixing ketoconazole cream with La Roche-Posay Lipikar with urea to exfoliate and reduce scaliness. - Prescribe fluconazole  oral tablets, one tablet daily for three consecutive days. - Provide six refills for ketoconazole cream.  Eczema Chronic eczema contributing to significant itching, possibly exacerbating the symptoms of tinea versicolor. Itching likely more related to eczema than tinea versicolor. - Prescribe triamcinolone  0.1% cream in a large jar for use on itchy areas twice daily for two weeks, followed by a two-week break. Repeat cycle as needed. - Advise to contact the clinic if triamcinolone  is ineffective, especially during winter months.   No follow-ups on file.  LILLETTE Lyle Cords, am acting as Neurosurgeon for [employer], DO.   Documentation: I have reviewed the above documentation for accuracy and completeness, and I agree with the above.  Delon Lenis, DO

## 2024-07-11 NOTE — Patient Instructions (Signed)

## 2024-08-09 ENCOUNTER — Ambulatory Visit: Admitting: Obstetrics

## 2024-08-09 ENCOUNTER — Other Ambulatory Visit (HOSPITAL_COMMUNITY)
Admission: RE | Admit: 2024-08-09 | Discharge: 2024-08-09 | Disposition: A | Discharge status: Home / Self Care | Source: Ambulatory Visit | Attending: Obstetrics | Admitting: Obstetrics

## 2024-08-09 ENCOUNTER — Encounter: Payer: Self-pay | Admitting: Obstetrics

## 2024-08-09 VITALS — HR 87 | Ht 64.0 in | Wt 164.0 lb

## 2024-08-09 DIAGNOSIS — Z3009 Encounter for other general counseling and advice on contraception: Secondary | ICD-10-CM

## 2024-08-09 DIAGNOSIS — M549 Dorsalgia, unspecified: Secondary | ICD-10-CM

## 2024-08-09 DIAGNOSIS — N898 Other specified noninflammatory disorders of vagina: Secondary | ICD-10-CM | POA: Diagnosis not present

## 2024-08-09 DIAGNOSIS — Z720 Tobacco use: Secondary | ICD-10-CM | POA: Diagnosis not present

## 2024-08-09 DIAGNOSIS — Z01419 Encounter for gynecological examination (general) (routine) without abnormal findings: Secondary | ICD-10-CM | POA: Diagnosis present

## 2024-08-09 NOTE — Progress Notes (Signed)
 Pt presents for annual. Pt wants all std testing. No other questions or concerns at this time.

## 2024-08-09 NOTE — Progress Notes (Addendum)
 Subjective:        Courtney [name] is a 33 y.o. female here for a routine exam.  Current complaints: Backache, chronic.SABRA    Personal health questionnaire:  Is patient Ashkenazi Jewish, have a family history of breast and/or ovarian cancer: no Is there a family history of uterine cancer diagnosed at age < 40, gastrointestinal cancer, urinary tract cancer, family member who is a Personnel Officer syndrome-associated carrier: no Is the patient overweight and hypertensive, family history of diabetes, personal history of gestational diabetes, preeclampsia or PCOS: no Is patient over 23, have PCOS,  family history of premature CHD under age 19, diabetes, smoke, have hypertension or peripheral artery disease:  no At any time, has a partner hit, kicked or otherwise hurt or frightened you?: no Over the past 2 weeks, have you felt down, depressed or hopeless?: no Over the past 2 weeks, have you felt little interest or pleasure in doing things?:no   Gynecologic History Patient's last menstrual period was 07/20/2024 (approximate). Contraception: condoms Last Pap: 2022. Results were: normal Last mammogram: N/A. Results were: N/A  Obstetric History OB History  Gravida Para Term Preterm AB Living  4 3 3  0 1 3  SAB IAB Ectopic Multiple Live Births  0 1 0 0 3    # Outcome Date GA Lbr Len/2nd Weight Sex Type Anes PTL Lv  4 Term 09/27/13 [redacted]w[redacted]d 05:33 / 01:25 7 lb 1.2 oz (3.209 kg) F Vag-Spont EPI  LIV  3 Term 07/05/12 [redacted]w[redacted]d 98:40 / 00:19 8 lb 2.5 oz (3.7 kg) F Vag-Spont EPI  LIV  2 Term 2009 [redacted]w[redacted]d 16:00 8 lb 5 oz (3.771 kg) M Vag-Spont EPI  LIV  1 IAB             Past Medical History:  Diagnosis Date   Back pain    Eczema    Gall stones     Past Surgical History:  Procedure Laterality Date   gallstones removed       Current Outpatient Medications:    fluconazole  (DIFLUCAN ) 150 MG tablet, Take 1 tablet (150 mg total) by mouth daily. (Patient not taking: Reported on 08/09/2024), Disp: 3 tablet,  Rfl: 9   ibuprofen  (ADVIL ) 800 MG tablet, Take 1 tablet (800 mg total) by mouth every 8 (eight) hours as needed. (Patient not taking: Reported on 08/09/2024), Disp: 30 tablet, Rfl: 5   ketoconazole (NIZORAL) 2 % cream, Apply 1 Application topically 2 (two) times daily. (Patient not taking: Reported on 08/09/2024), Disp: 60 g, Rfl: 9   methocarbamol  (ROBAXIN ) 500 MG tablet, Take 1 tablet (500 mg total) by mouth 2 (two) times daily. (Patient not taking: Reported on 08/09/2024), Disp: 20 tablet, Rfl: 0   naproxen  (NAPROSYN ) 375 MG tablet, Take 1 tablet (375 mg total) by mouth 2 (two) times daily. (Patient not taking: Reported on 08/09/2024), Disp: 20 tablet, Rfl: 0   triamcinolone  cream (KENALOG ) 0.1 %, Apply 1 Application topically 2 (two) times daily. Twice daily for two weeks then STOP for two weeks and may resume if needed. (Patient not taking: Reported on 08/09/2024), Disp: 453.6 g, Rfl: 9 Not on File  Social History   Tobacco Use   Smoking status: Every Day    Current packs/day: 0.25    Average packs/day: 0.3 packs/day for 2.0 years (0.5 ttl pk-yrs)    Types: Cigarettes   Smokeless tobacco: Never   Tobacco comments:    8 per day   Substance Use Topics   Alcohol use: Not Currently  Comment: every 2-3 days    Family History  Problem Relation Age of Onset   Diabetes Mother    Healthy Father       Review of Systems  Constitutional: negative for fatigue and weight loss Respiratory: negative for cough and wheezing Cardiovascular: negative for chest pain, fatigue and palpitations Gastrointestinal: negative for abdominal pain and change in bowel habits Musculoskeletal:negative for myalgias Neurological: negative for gait problems and tremors Behavioral/Psych: negative for abusive relationship, depression Endocrine: negative for temperature intolerance    Genitourinary: positive for vaginal discharge.  negative for abnormal menstrual periods, genital lesions, hot flashes, sexual  problems  Integument/breast: negative for breast lump, breast tenderness, nipple discharge and skin lesion(s)    Objective:       Pulse 87   Ht 5' 4 (1.626 m)   Wt 164 lb (74.4 kg)   LMP 07/20/2024 (Approximate)   BMI 28.15 kg/m  General:   Alert and no distress  Skin:   no rash or abnormalities  Lungs:   clear to auscultation bilaterally  Heart:   regular rate and rhythm, S1, S2 normal, no murmur, click, rub or gallop  Breasts:   normal without suspicious masses, skin or nipple changes or axillary nodes  Abdomen:  normal findings: no organomegaly, soft, non-tender and no hernia  Pelvis:  External genitalia: normal general appearance Urinary system: urethral meatus normal and bladder without fullness, nontender Vaginal: normal without tenderness, induration or masses Cervix: normal appearance Adnexa: normal bimanual exam Uterus: anteverted and non-tender, normal size   Lab Review Urine pregnancy test Labs reviewed yes Radiologic studies reviewed no  I have spent a total of 20 minutes of face-to-face time, excluding clinical staff time, reviewing notes and preparing to see patient, ordering tests and/or medications, and counseling the patient.   Assessment:   1. Encounter for gynecological examination with Papanicolaou smear of cervix (Primary) Rx: - Cytology - PAP( [place])  2. Vaginal discharge Rx: - Cervicovaginal ancillary only( [place]) - HIV antibody (with reflex) - RPR - Hepatitis C Antibody - Hepatitis B Surface AntiGEN  3. Tobacco use - cessation encouraged  4. Encounter for counseling regarding contraception - options discussed.  Wants to continue using condoms. - condom use also endorsed for prevention of STD's  5. Backache symptom, chronic Rx: - AMB referral to orthopedics      Plan:    Education reviewed: calcium  supplements, depression evaluation, low fat, low cholesterol diet, safe sex/STD prevention, self breast exams, smoking  cessation, and weight bearing exercise. Contraception: condoms. Follow up in: 1 year.    Orders Placed This Encounter  Procedures   HIV antibody (with reflex)   RPR   Hepatitis C Antibody   Hepatitis B Surface AntiGEN   AMB referral to orthopedics    Referral Priority:   Routine    Referral Type:   Consultation    Number of Visits Requested:   1     CARLIN RONAL CENTERS, MD, FACOG Attending Obstetrician & Gynecologist, [facility] for [facility], [facility], [place] 08/09/2024

## 2024-08-09 NOTE — Addendum Note (Signed)
 Addended by: RUDY DUNNINGS A on: 08/09/2024 02:55 PM   Modules accepted: Orders

## 2024-08-10 ENCOUNTER — Ambulatory Visit: Payer: Self-pay | Admitting: Obstetrics

## 2024-08-10 DIAGNOSIS — B9689 Other specified bacterial agents as the cause of diseases classified elsewhere: Secondary | ICD-10-CM

## 2024-08-10 LAB — CERVICOVAGINAL ANCILLARY ONLY
Bacterial Vaginitis (gardnerella): POSITIVE — AB
Candida Glabrata: NEGATIVE
Candida Vaginitis: NEGATIVE
Chlamydia: NEGATIVE
Comment: NEGATIVE
Comment: NEGATIVE
Comment: NEGATIVE
Comment: NEGATIVE
Comment: NEGATIVE
Comment: NORMAL
Neisseria Gonorrhea: NEGATIVE
Trichomonas: NEGATIVE

## 2024-08-10 LAB — RPR: RPR Ser Ql: NONREACTIVE

## 2024-08-10 LAB — HEPATITIS C ANTIBODY: Hep C Virus Ab: NONREACTIVE

## 2024-08-10 LAB — HIV ANTIBODY (ROUTINE TESTING W REFLEX): HIV Screen 4th Generation wRfx: NONREACTIVE

## 2024-08-10 LAB — HEPATITIS B SURFACE ANTIGEN: Hepatitis B Surface Ag: NEGATIVE

## 2024-08-10 MED ORDER — METRONIDAZOLE 500 MG PO TABS: 500.0000 mg | ORAL_TABLET | Freq: Two times a day (BID) | ORAL | 2 refills | Status: AC

## 2024-08-13 ENCOUNTER — Encounter: Payer: Self-pay | Admitting: Radiology

## 2024-08-14 LAB — CYTOLOGY - PAP
Comment: NEGATIVE
Comment: NEGATIVE
Comment: NEGATIVE
Diagnosis: NEGATIVE
HPV 16: NEGATIVE
HPV 18 / 45: NEGATIVE
High risk HPV: POSITIVE — AB

## 2024-10-16 ENCOUNTER — Telehealth: Payer: Self-pay | Admitting: *Deleted

## 2024-10-16 NOTE — Telephone Encounter (Signed)
 RTC pt providing further info on recent Pap results. Clarification offered on Pap w/ HR HPV and need for repeat in one year. All questions were answered. Pt verbalized understanding.

## 2025-07-15 ENCOUNTER — Ambulatory Visit: Admitting: Dermatology
